# Patient Record
Sex: Female | Born: 1965 | Race: White | Hispanic: No | Marital: Married | Smoking: Never smoker
Health system: Southern US, Community
[De-identification: ages and names within clinical notes are randomized; demographics above are authoritative.]

## PROBLEM LIST (undated history)

## (undated) DIAGNOSIS — R112 Nausea with vomiting, unspecified: Secondary | ICD-10-CM

## (undated) DIAGNOSIS — G4733 Obstructive sleep apnea (adult) (pediatric): Secondary | ICD-10-CM

## (undated) DIAGNOSIS — R61 Generalized hyperhidrosis: Secondary | ICD-10-CM

## (undated) DIAGNOSIS — K219 Gastro-esophageal reflux disease without esophagitis: Secondary | ICD-10-CM

## (undated) DIAGNOSIS — I10 Essential (primary) hypertension: Secondary | ICD-10-CM

## (undated) DIAGNOSIS — E7800 Pure hypercholesterolemia, unspecified: Secondary | ICD-10-CM

## (undated) DIAGNOSIS — Z9889 Other specified postprocedural states: Secondary | ICD-10-CM

## (undated) DIAGNOSIS — M1990 Unspecified osteoarthritis, unspecified site: Secondary | ICD-10-CM

## (undated) DIAGNOSIS — N950 Postmenopausal bleeding: Secondary | ICD-10-CM

## (undated) DIAGNOSIS — F32A Depression, unspecified: Secondary | ICD-10-CM

## (undated) DIAGNOSIS — F419 Anxiety disorder, unspecified: Secondary | ICD-10-CM

## (undated) DIAGNOSIS — R232 Flushing: Secondary | ICD-10-CM

## (undated) DIAGNOSIS — F329 Major depressive disorder, single episode, unspecified: Secondary | ICD-10-CM

## (undated) DIAGNOSIS — Z79890 Hormone replacement therapy (postmenopausal): Secondary | ICD-10-CM

## (undated) DIAGNOSIS — E119 Type 2 diabetes mellitus without complications: Secondary | ICD-10-CM

## (undated) HISTORY — DX: Unspecified osteoarthritis, unspecified site: M19.90

## (undated) HISTORY — DX: Flushing: R23.2

## (undated) HISTORY — DX: Generalized hyperhidrosis: R61

## (undated) HISTORY — DX: Gastro-esophageal reflux disease without esophagitis: K21.9

## (undated) HISTORY — DX: Hormone replacement therapy: Z79.890

## (undated) HISTORY — DX: Postmenopausal bleeding: N95.0

## (undated) HISTORY — DX: Type 2 diabetes mellitus without complications: E11.9

---

## 1994-06-27 HISTORY — PX: LASIK: SHX215

## 1998-06-27 HISTORY — PX: CERVICAL SPINE SURGERY: SHX589

## 1999-02-12 ENCOUNTER — Observation Stay (HOSPITAL_COMMUNITY): Admission: RE | Admit: 1999-02-12 | Discharge: 1999-02-13 | Payer: Self-pay | Admitting: Neurosurgery

## 1999-02-12 ENCOUNTER — Encounter: Payer: Self-pay | Admitting: Neurosurgery

## 1999-06-01 ENCOUNTER — Encounter: Admission: RE | Admit: 1999-06-01 | Discharge: 1999-06-01 | Payer: Self-pay | Admitting: Neurosurgery

## 1999-06-01 ENCOUNTER — Encounter: Payer: Self-pay | Admitting: Neurosurgery

## 2000-03-21 ENCOUNTER — Encounter: Admission: RE | Admit: 2000-03-21 | Discharge: 2000-03-21 | Payer: Self-pay | Admitting: Neurosurgery

## 2000-03-21 ENCOUNTER — Encounter: Payer: Self-pay | Admitting: Neurosurgery

## 2000-06-27 HISTORY — PX: CHOLECYSTECTOMY: SHX55

## 2001-01-08 ENCOUNTER — Encounter: Payer: Self-pay | Admitting: Internal Medicine

## 2001-01-08 ENCOUNTER — Ambulatory Visit (HOSPITAL_COMMUNITY): Admission: RE | Admit: 2001-01-08 | Discharge: 2001-01-08 | Payer: Self-pay | Admitting: Internal Medicine

## 2001-01-15 ENCOUNTER — Ambulatory Visit (HOSPITAL_COMMUNITY): Admission: RE | Admit: 2001-01-15 | Discharge: 2001-01-15 | Payer: Self-pay | Admitting: Internal Medicine

## 2001-01-15 ENCOUNTER — Encounter: Payer: Self-pay | Admitting: Internal Medicine

## 2001-01-26 ENCOUNTER — Ambulatory Visit (HOSPITAL_COMMUNITY): Admission: RE | Admit: 2001-01-26 | Discharge: 2001-01-26 | Payer: Self-pay | Admitting: General Surgery

## 2001-02-09 ENCOUNTER — Other Ambulatory Visit: Admission: RE | Admit: 2001-02-09 | Discharge: 2001-02-09 | Payer: Self-pay | Admitting: Obstetrics and Gynecology

## 2003-07-14 ENCOUNTER — Emergency Department (HOSPITAL_COMMUNITY): Admission: EM | Admit: 2003-07-14 | Discharge: 2003-07-14 | Payer: Self-pay | Admitting: Emergency Medicine

## 2004-12-20 ENCOUNTER — Encounter: Admission: RE | Admit: 2004-12-20 | Discharge: 2004-12-20 | Payer: Self-pay | Admitting: Obstetrics and Gynecology

## 2005-04-18 ENCOUNTER — Other Ambulatory Visit: Admission: RE | Admit: 2005-04-18 | Discharge: 2005-04-18 | Payer: Self-pay | Admitting: Obstetrics and Gynecology

## 2006-05-16 ENCOUNTER — Other Ambulatory Visit: Admission: RE | Admit: 2006-05-16 | Discharge: 2006-05-16 | Payer: Self-pay | Admitting: Obstetrics and Gynecology

## 2006-07-12 ENCOUNTER — Ambulatory Visit: Payer: Self-pay | Admitting: Internal Medicine

## 2006-07-13 ENCOUNTER — Ambulatory Visit (HOSPITAL_COMMUNITY): Admission: RE | Admit: 2006-07-13 | Discharge: 2006-07-13 | Payer: Self-pay | Admitting: Internal Medicine

## 2006-07-17 ENCOUNTER — Ambulatory Visit (HOSPITAL_COMMUNITY): Admission: RE | Admit: 2006-07-17 | Discharge: 2006-07-17 | Payer: Self-pay | Admitting: Internal Medicine

## 2006-07-17 ENCOUNTER — Ambulatory Visit: Payer: Self-pay | Admitting: Internal Medicine

## 2006-10-10 ENCOUNTER — Ambulatory Visit: Payer: Self-pay | Admitting: Internal Medicine

## 2007-03-07 ENCOUNTER — Ambulatory Visit (HOSPITAL_COMMUNITY): Admission: RE | Admit: 2007-03-07 | Discharge: 2007-03-07 | Payer: Self-pay | Admitting: Obstetrics and Gynecology

## 2007-03-22 ENCOUNTER — Ambulatory Visit (HOSPITAL_COMMUNITY): Admission: RE | Admit: 2007-03-22 | Discharge: 2007-03-22 | Payer: Self-pay | Admitting: Family Medicine

## 2007-06-05 ENCOUNTER — Other Ambulatory Visit: Admission: RE | Admit: 2007-06-05 | Discharge: 2007-06-05 | Payer: Self-pay | Admitting: Obstetrics and Gynecology

## 2007-07-26 ENCOUNTER — Ambulatory Visit: Payer: Self-pay | Admitting: Internal Medicine

## 2008-03-06 DIAGNOSIS — K219 Gastro-esophageal reflux disease without esophagitis: Secondary | ICD-10-CM | POA: Insufficient documentation

## 2008-03-06 DIAGNOSIS — L719 Rosacea, unspecified: Secondary | ICD-10-CM

## 2008-03-06 DIAGNOSIS — F429 Obsessive-compulsive disorder, unspecified: Secondary | ICD-10-CM | POA: Insufficient documentation

## 2008-03-06 DIAGNOSIS — K2960 Other gastritis without bleeding: Secondary | ICD-10-CM

## 2008-03-06 DIAGNOSIS — F329 Major depressive disorder, single episode, unspecified: Secondary | ICD-10-CM | POA: Insufficient documentation

## 2008-03-06 DIAGNOSIS — F411 Generalized anxiety disorder: Secondary | ICD-10-CM | POA: Insufficient documentation

## 2008-03-06 DIAGNOSIS — R197 Diarrhea, unspecified: Secondary | ICD-10-CM

## 2008-03-06 DIAGNOSIS — K589 Irritable bowel syndrome without diarrhea: Secondary | ICD-10-CM

## 2008-03-06 DIAGNOSIS — I10 Essential (primary) hypertension: Secondary | ICD-10-CM

## 2008-03-06 DIAGNOSIS — K449 Diaphragmatic hernia without obstruction or gangrene: Secondary | ICD-10-CM | POA: Insufficient documentation

## 2008-03-06 DIAGNOSIS — K210 Gastro-esophageal reflux disease with esophagitis: Secondary | ICD-10-CM

## 2008-03-06 DIAGNOSIS — T7840XA Allergy, unspecified, initial encounter: Secondary | ICD-10-CM | POA: Insufficient documentation

## 2008-03-06 DIAGNOSIS — E7800 Pure hypercholesterolemia, unspecified: Secondary | ICD-10-CM

## 2008-04-01 ENCOUNTER — Ambulatory Visit (HOSPITAL_COMMUNITY): Admission: RE | Admit: 2008-04-01 | Discharge: 2008-04-01 | Payer: Self-pay | Admitting: Obstetrics and Gynecology

## 2008-06-12 ENCOUNTER — Other Ambulatory Visit: Admission: RE | Admit: 2008-06-12 | Discharge: 2008-06-12 | Payer: Self-pay | Admitting: Obstetrics and Gynecology

## 2009-04-08 ENCOUNTER — Ambulatory Visit (HOSPITAL_COMMUNITY): Admission: RE | Admit: 2009-04-08 | Discharge: 2009-04-08 | Payer: Self-pay | Admitting: Obstetrics and Gynecology

## 2009-08-20 ENCOUNTER — Other Ambulatory Visit: Admission: RE | Admit: 2009-08-20 | Discharge: 2009-08-20 | Payer: Self-pay | Admitting: Obstetrics and Gynecology

## 2009-10-21 ENCOUNTER — Ambulatory Visit: Payer: Self-pay | Admitting: Gastroenterology

## 2009-10-21 DIAGNOSIS — R112 Nausea with vomiting, unspecified: Secondary | ICD-10-CM

## 2009-10-21 DIAGNOSIS — R1319 Other dysphagia: Secondary | ICD-10-CM

## 2009-11-09 ENCOUNTER — Encounter (HOSPITAL_COMMUNITY): Admission: RE | Admit: 2009-11-09 | Discharge: 2009-11-09 | Payer: Self-pay | Admitting: Gastroenterology

## 2010-04-12 ENCOUNTER — Ambulatory Visit (HOSPITAL_COMMUNITY): Admission: RE | Admit: 2010-04-12 | Discharge: 2010-04-12 | Payer: Self-pay | Admitting: Obstetrics and Gynecology

## 2010-04-30 ENCOUNTER — Encounter: Payer: Self-pay | Admitting: Gastroenterology

## 2010-04-30 ENCOUNTER — Ambulatory Visit (HOSPITAL_COMMUNITY): Admission: RE | Admit: 2010-04-30 | Discharge: 2010-04-30 | Payer: Self-pay | Admitting: Internal Medicine

## 2010-07-27 NOTE — Assessment & Plan Note (Signed)
Summary: VOMITING, DYSPHAGIA, GERD   Visit Type:  Follow-up Visit Primary Care Provider:  Melony Overly, PA-c  Chief Complaint:  F/U gerd.  History of Present Illness: Seems to have a more sensitive. Gets nausea and sick quite a bit. May thorw up a meal for no reason. Melody Johnson felt sick and had vomiting and not really diarrhea. Since then still has yellow. BMs: 4 and s/t more(soft). Not a lot milk: <2-3x/week. Cheese: 2x/week. Ice Cream: rare. No GB. Trying to lose weight: 6 lbs in past 6 weeks. Heartburn/indigestion: varies w/ control. Sx: gas, burping, lays down spits up acid in throat and mouth. Sx flare 1-2x/week. Vomtiing is random. Doesn't really have problem.  Rare carbonated or caffeinated beverages. No cigs. Mild fat: yesterday-nothing for breakfast, reg coffee: 1/2 cu., weight watcher meal:baked ziti, supper: bologna and egg sandwich. Prilosec two times a day-in AM before and in PM after for 2-3 years. No bld in stool and black tarry stool. Pain not really an issue. Sx since Jan 2011. Getting choked a lot. Feels like food gets hung: 2-3x/week (solids and liquids). Diazepam for dizzy spells, and Xanax for sleep. Pt has not taken fluroxamine for OCD.  Preventive Screening-Counseling & Management  Alcohol-Tobacco     Smoking Status: never      Drug Use:  no.    Current Medications (verified): 1)  Altace 10 Mg Tabs (Ramipril) .... Once Daily 2)  Zyrtec Allergy 10 Mg Tabs (Cetirizine Hcl) .... Once Daily 3)  Valtrex 500 Mg Tabs (Valacyclovir Hcl) .... Once Daily 4)  Aspirin 81 Mg Tabs (Aspirin) .... Once Daily 5)  Multivitamins  Tabs (Multiple Vitamin) .... Once Daily 6)  Citracal Plus  Tabs (Multiple Minerals-Vitamins) .... Four Tablets Daily 7)  Xanax 0.5 Mg Tabs (Alprazolam) .... As Needed 8)  Prilosec 20 Mg Cpdr (Omeprazole) .... Two Times A Day 9)  Fluroxamine 100mg  .... Once Daily 10)  Simvastatin 40 Mg Tabs (Simvastatin) .... Take 1 Tablet By Mouth Once A Day 11)  Diazepam 5  Mg Tabs (Diazepam) .... 1/2 To One Tablet As Needed 12)  Fish Oil 1200 Mg .... Two Daily 13)  Denavir 1 % Crea (Penciclovir) .... As Directed 14)  Fiber Choice .... As Needed 15)  Fiber Advance .... As Needed  Allergies (verified): 1)  ! Codeine 2)  ! Neosporin 3)  ! * Polysporin 4)  ! Penicillin 5)  ! 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  Social History: Occupation: housewife, Marriedx13 years, youngest 6 Patient has never smoked.  Alcohol Use - no Illicit Drug Use - no Smoking Status:  never Drug Use:  no  Review of Systems       Per HPI otherwise all systems negative.  191 lbs in 2009.  Vital Signs:  Patient profile:   45 year old female Height:      62 inches Weight:      194 pounds BMI:     35.61 Temp:     98.6 degrees F oral Pulse rate:   96 / minute BP sitting:   140 / 90  (left arm) Cuff size:   regular  Vitals Entered By: Cloria Spring LPN (October 21, 2009 9:33 AM)  Impression & Recommendations:  Problem # 1:  NAUSEA AND  VOMITING (ICD-787.01) Assessment New  No warning signs. Pt is 3 lbs heavier since 2009. Pt most likely has uncontrolled reflux. Differential diagnosis includes low likelihood of gastroparesis, or esophageal stricture. GES w/i next week. OPV in 3 mos.  Orders: Est. Patient Level V (16109)  Problem # 2:  OTHER DYSPHAGIA (UEA-540.98) Assessment: New  Pt most likely has uncontrolled reflux. Differential diagnosis includes primary esophageal motility disorder and less likely a esophageal stricture.  BPE w/i next week. If stricture, or EMD seen then will need an EGD.   Orders: Est. Patient Level V (11914)  Problem # 3:  GERD (ICD-530.81) Assessment:  Unchanged  Pt's Sx not ideally controlled. Pt has a diet that at times is not low in fat. Continue Prilosec two times a day. May clinic HO given. Pt also having 4 soft stool daily and consumes fat/lactose. No diarrhea. Add DA-LI daily. Low fat/high fiber diet. HO given.  CC: PCP  Orders: Est. Patient Level V (78295)  Patient Instructions: 1)  Follow a low fat/high fiber diet. SEE HANDOUT. 2)  Continue Prilosec two times a day. Take 30 minutes before meals. 3)  Get Barium Swallow and gastric emptying study. 4)  TAKE PROBIOTIC DAILY. 5)  Return visit in 3 mos. 6)  The medication list was reviewed and reconciled.  All changed / newly prescribed medications were explained.  A complete medication list was provided to the patient / caregiver.  Appended Document: VOMITING, DYSPHAGIA, GERD reminder in computer

## 2010-07-27 NOTE — Letter (Signed)
Summary: GES/BPE ORDER  GES/BPE ORDER   Imported By: Ave Filter 10/21/2009 10:35:23  _____________________________________________________________________  External Attachment:    Type:   Image     Comment:   External Document  Appended Document: GES/BPE ORDER Pt rescheduled to 11/09/09@8 :00a.m.

## 2010-07-27 NOTE — Letter (Signed)
Summary: RELEASE OF RECORDS TO [facility]  RELEASE OF RECORDS TO [facility]   Imported By: Diana Eves 04/30/2010 13:13:38  _____________________________________________________________________  External Attachment:    Type:   Image     Comment:   External Document

## 2010-07-29 ENCOUNTER — Other Ambulatory Visit (HOSPITAL_COMMUNITY): Payer: Self-pay | Admitting: Orthopaedic Surgery

## 2010-07-29 DIAGNOSIS — M25569 Pain in unspecified knee: Secondary | ICD-10-CM

## 2010-08-03 ENCOUNTER — Other Ambulatory Visit (HOSPITAL_COMMUNITY): Payer: Self-pay

## 2010-08-04 ENCOUNTER — Ambulatory Visit (HOSPITAL_COMMUNITY)
Admission: RE | Admit: 2010-08-04 | Discharge: 2010-08-04 | Disposition: A | Discharge status: Home / Self Care | Payer: 59 | Source: Ambulatory Visit | Attending: Orthopaedic Surgery | Admitting: Orthopaedic Surgery

## 2010-08-04 DIAGNOSIS — M25569 Pain in unspecified knee: Secondary | ICD-10-CM | POA: Insufficient documentation

## 2010-08-19 ENCOUNTER — Other Ambulatory Visit (HOSPITAL_COMMUNITY)
Admission: RE | Admit: 2010-08-19 | Discharge: 2010-08-19 | Disposition: A | Discharge status: Home / Self Care | Payer: 59 | Source: Ambulatory Visit | Attending: Obstetrics and Gynecology | Admitting: Obstetrics and Gynecology

## 2010-08-19 DIAGNOSIS — Z01419 Encounter for gynecological examination (general) (routine) without abnormal findings: Secondary | ICD-10-CM | POA: Insufficient documentation

## 2010-11-09 NOTE — Assessment & Plan Note (Signed)
Melody Johnson, Melody Johnson              CHART#:  [id number]   DATE:  07/26/2007                       DOB:  November 29, 1965   PRIMARY CARE PHYSICIAN:  Melony Overly, PA at [facility].   CHIEF COMPLAINT:  Follow up chronic gastroesophageal reflux disease.   SUBJECTIVE:  Melody Johnson is a 45 year old female who has history of  chronic GERD and erosive reflux esophagitis and gastritis on last EGD,  which was July 17, 2006. She was not poorly controlled on daily PPI.  She has since been on b.i.d. therapy. She is currently on Omeprazole 20  mg b.i.d. She is doing well. She has tried to decrease her dosage to  once daily, however, is with having refractory symptoms, her weight is  down 7 pounds in the last year. She does not have anorexia or abdominal  pain. Denies any fatigue. Denies rectal bleeding or melena. She has had  a physical examination including pap smear. She is also being treated  for anxiety and OCD. She denies any family history of colorectal  carcinoma.   CURRENT MEDICATIONS:  See records from July 26, 2007.   ALLERGIES:  CODEINE, NEOSPORIN, POLYSPORIN, PENICILLIN, , SULFA.   PHYSICAL EXAMINATION:  VITAL SIGNS:  Weight 191 pounds. Height 62  inches. Temperature 98.7, blood pressure 120/90, pulse 80.  GENERAL:  She is an obese, Caucasian female. Alert and oriented. No  acute distress.  HEENT:  Sclera clear.  Oropharynx pink and moist without any lesions.  HEART:  Regular rate and rhythm. Normal S1 and S2.  ABDOMEN:  Positive bowel sounds x4. No bruits auscultated. Soft,  nontender, and nondistended. Without palpable mass or  hepatosplenomegaly. No rebound tenderness or guarding, exam limited  secondary to patient's body habitus.  EXTREMITIES:  Without clubbing or edema.   ASSESSMENT:  History of complicated gastroesophageal reflux disease with  erosive reflux esophagitis and gastritis, well controlled on b.i.d. PPI.   PLAN:  1. Commended her on weight loss  and she should have continued gradual      weight loss.  2. Screening colonoscopy at age 8.  3. Omeprazole 20 mg b.i.d.       Lorenza Burton, N.P.  Electronically Signed     Kassie Mends, M.D.  Electronically Signed    KJ/MEDQ  D:  07/26/2007  T:  07/26/2007  Job:  [id number]   cc:   Melony Overly, PA
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
## 2010-11-12 NOTE — Op Note (Signed)
NAME:  Melody Johnson, Melody Johnson             ACCOUNT NO.:  [id number]   MEDICAL RECORD NO.:  [id number]          PATIENT TYPE:  AMB   LOCATION:  DAY                           FACILITY:  [facility]   PHYSICIAN:  Lionel December, M.D.    DATE OF BIRTH:  10/29/1965   DATE OF PROCEDURE:  07/17/2006  DATE OF DISCHARGE:                               OPERATIVE REPORT   PROCEDURE:  Esophagogastroduodenoscopy.   INDICATIONS:  Ms. Coonrod is a 45 year old Caucasian female who has had  symptoms of GERD for 3 years.  She was initially on Nexium and was doing  well, but had to be switched to another PPI because of her plan.  Now  she is having frequent nocturnal symptoms with regurgitation and  intractable heartburn.  She is undergoing diagnostic EGD.  Procedure  risks were reviewed with the patient and  informed consent was obtained.   MEDICATIONS FOR CONSCIOUS SEDATION:  1. Benzocaine spray for pharyngeal topical anesthesia.  2. Demerol 50 mg IV.  3. Versed 12 mg IV.   FINDINGS:  Procedure performed in endoscopy suite.  The patient's vital  signs and O2 saturations were monitored during the procedure, and  remained stable.   DESCRIPTION OF PROCEDURE:  The patient was placed in the left lateral  recumbent position and the Pentax videoscope was passed via oropharynx  without any difficulty into the esophagus.   ESOPHAGUS:  Mucosa of the esophagus was normal.  Single erosion was  noted at GE junction, which was at 34 cm from the incisors and hiatus  was at 37 cm.  She had 3 cm size sliding hiatal hernia, which became  more prominent -- she heaved a couple of times during the procedure.   STOMACH.  Empty and distended very well with insufflation.  Folds of the  proximal stomach were normal.  Examination of the mucosa revealed 2  prepyloric erosions but no ulcer crater was noted.  Pyloric channel was  patent.  Angularis, fundus and cardia were examined by retroflexing the  scope and were normal.   DUODENUM:   Bulbar mucosa was normal.  Scope was passed into second part  of the duodenum, where mucosa and folds were normal.   The endoscope was withdrawn.  The patient tolerated the procedure well.   FINAL DIAGNOSES:  1. Erosive reflux esophagitis with small sliding hiatal hernia.  2. Erosive antral gastritis.  3. The patient's GERD is not well-controlled with single dose PPI.   RECOMMENDATIONS:  1. Antireflux measures reinforced.  2. Switch her to Omeprazole 20 mg p.o. b.i.d.; prescription given for      60 with 3 refills.  3. H. pylori serology to be checked today.      Lionel December, M.D.  Electronically Signed     NR/MEDQ  D:  07/17/2006  T:  07/17/2006  Job:  [id number]   cc:   Melony Overly, PA

## 2011-03-10 ENCOUNTER — Other Ambulatory Visit: Payer: Self-pay | Admitting: Obstetrics and Gynecology

## 2011-03-10 DIAGNOSIS — Z139 Encounter for screening, unspecified: Secondary | ICD-10-CM

## 2011-04-14 ENCOUNTER — Ambulatory Visit (HOSPITAL_COMMUNITY)
Admission: RE | Admit: 2011-04-14 | Discharge: 2011-04-14 | Disposition: A | Discharge status: Home / Self Care | Payer: 59 | Source: Ambulatory Visit | Attending: Obstetrics and Gynecology | Admitting: Obstetrics and Gynecology

## 2011-04-14 DIAGNOSIS — Z1231 Encounter for screening mammogram for malignant neoplasm of breast: Secondary | ICD-10-CM | POA: Insufficient documentation

## 2011-04-14 DIAGNOSIS — Z139 Encounter for screening, unspecified: Secondary | ICD-10-CM

## 2011-04-25 ENCOUNTER — Emergency Department (HOSPITAL_COMMUNITY)
Admission: EM | Admit: 2011-04-25 | Discharge: 2011-04-25 | Disposition: A | Discharge status: Home / Self Care | Payer: 59 | Attending: Emergency Medicine | Admitting: Emergency Medicine

## 2011-04-25 ENCOUNTER — Emergency Department (HOSPITAL_COMMUNITY): Payer: 59

## 2011-04-25 ENCOUNTER — Encounter: Payer: Self-pay | Admitting: *Deleted

## 2011-04-25 DIAGNOSIS — I10 Essential (primary) hypertension: Secondary | ICD-10-CM | POA: Insufficient documentation

## 2011-04-25 DIAGNOSIS — E7800 Pure hypercholesterolemia, unspecified: Secondary | ICD-10-CM | POA: Insufficient documentation

## 2011-04-25 DIAGNOSIS — G4733 Obstructive sleep apnea (adult) (pediatric): Secondary | ICD-10-CM | POA: Insufficient documentation

## 2011-04-25 DIAGNOSIS — N200 Calculus of kidney: Secondary | ICD-10-CM | POA: Insufficient documentation

## 2011-04-25 HISTORY — DX: Pure hypercholesterolemia, unspecified: E78.00

## 2011-04-25 HISTORY — DX: Obstructive sleep apnea (adult) (pediatric): G47.33

## 2011-04-25 HISTORY — DX: Essential (primary) hypertension: I10

## 2011-04-25 LAB — CBC
HCT: 38.7 % (ref 36.0–46.0)
Hemoglobin: 13.1 g/dL (ref 12.0–15.0)
MCHC: 33.9 g/dL (ref 30.0–36.0)
RBC: 4.11 MIL/uL (ref 3.87–5.11)
WBC: 10.1 10*3/uL (ref 4.0–10.5)

## 2011-04-25 LAB — URINALYSIS, ROUTINE W REFLEX MICROSCOPIC
Glucose, UA: NEGATIVE mg/dL
Leukocytes, UA: NEGATIVE
Nitrite: NEGATIVE
Specific Gravity, Urine: 1.015 (ref 1.005–1.030)
pH: 5.5 (ref 5.0–8.0)

## 2011-04-25 LAB — BASIC METABOLIC PANEL
BUN: 11 mg/dL (ref 6–23)
CO2: 25 mEq/L (ref 19–32)
Chloride: 103 mEq/L (ref 96–112)
GFR calc Af Amer: >90 mL/min (ref >90)
Potassium: 3.7 mEq/L (ref 3.5–5.1)

## 2011-04-25 LAB — DIFFERENTIAL
Basophils Relative: 0 % (ref 0–1)
Lymphocytes Relative: 22 % (ref 12–46)
Monocytes Absolute: 0.8 10*3/uL (ref 0.1–1.0)
Monocytes Relative: 8 % (ref 3–12)
Neutro Abs: 6.9 10*3/uL (ref 1.7–7.7)
Neutrophils Relative %: 68 % (ref 43–77)

## 2011-04-25 LAB — URINE MICROSCOPIC-ADD ON

## 2011-04-25 MED ORDER — OXYCODONE-ACETAMINOPHEN 5-325 MG PO TABS: 1.0000 | ORAL_TABLET | ORAL | Status: AC | PRN

## 2011-04-25 MED ORDER — SODIUM CHLORIDE 0.9 % IV BOLUS (SEPSIS)
1000.0000 mL | Freq: Once | INTRAVENOUS | Status: AC
  Administered 2011-04-25: 1000 mL via INTRAVENOUS

## 2011-04-25 MED ORDER — ONDANSETRON HCL 4 MG PO TABS: 4.0000 mg | ORAL_TABLET | Freq: Four times a day (QID) | ORAL | Status: AC

## 2011-04-25 MED ORDER — MORPHINE SULFATE 4 MG/ML IJ SOLN
4.0000 mg | Freq: Once | INTRAMUSCULAR | Status: AC
  Administered 2011-04-25: 4 mg via INTRAVENOUS
  Filled 2011-04-25: qty 1

## 2011-04-25 MED ORDER — ONDANSETRON HCL 4 MG/2ML IJ SOLN
4.0000 mg | Freq: Once | INTRAMUSCULAR | Status: AC
  Administered 2011-04-25: 4 mg via INTRAVENOUS
  Filled 2011-04-25: qty 2

## 2011-04-25 NOTE — ED Notes (Signed)
Pt c/o left flank pain x 10 days off and on. Pt also c/o painful urination, urgency, and ? Blood in urine. Pt states that she has had vomiting this morning.

## 2011-04-25 NOTE — ED Provider Notes (Signed)
History     CSN: [id number] Arrival date & time: 04/25/2011  8:58 AM   First MD Initiated Contact with Patient 04/25/11 [phone]      Chief Complaint  Patient presents with  . Flank Pain    (Consider location/radiation/quality/duration/timing/severity/associated sxs/prior treatment) HPI Comments: paitent c/o left flank pain, dark colored urine and nausea and vomiting that occurred 10 days ago then resolved.  States she woke up this morning at 6:00 am with similar symptoms again.  States the pain is sharp feeling in her bladder and "dull ache" to her left flank at present.  She denies hx of previous kidney stones.  Also denies fever, chills, chest pain or shortness of breath.  Patient is a 45 y.o. female presenting with flank pain.  Flank Pain Associated symptoms include abdominal pain, nausea and vomiting. Pertinent negatives include no arthralgias, chest pain, chills, coughing, fatigue, fever, myalgias, neck pain, numbness, rash, sore throat or weakness. Exacerbated by: urination. She has tried nothing for the symptoms. The treatment provided no relief.    Past Medical History  Diagnosis Date  . Hypertension   . High cholesterol   . Obstructive sleep apnea     Past Surgical History  Procedure Date  . Cholecystectomy   . Cervical spine surgery     History reviewed. No pertinent family history.  History  Substance Use Topics  . Smoking status: Never Smoker   . Smokeless tobacco: Not on file  . Alcohol Use: No    OB History    Grav Para Term Preterm Abortions TAB SAB Ect Mult Living                  Review of Systems  Constitutional: Negative for fever, chills and fatigue.  HENT: Negative for sore throat, trouble swallowing, neck pain and neck stiffness.   Respiratory: Negative for cough, shortness of breath and wheezing.   Cardiovascular: Negative for chest pain and palpitations.  Gastrointestinal: Positive for nausea, vomiting and abdominal pain.  Genitourinary:  Positive for dysuria, urgency, flank pain and difficulty urinating. Negative for hematuria, vaginal bleeding and vaginal discharge.  Musculoskeletal: Negative for myalgias, back pain and arthralgias.  Skin: Negative for rash.  Neurological: Negative for dizziness, weakness and numbness.  Hematological: Does not bruise/bleed easily.  All other systems reviewed and are negative.    Allergies  Bacitracin-polymyxin b; Codeine; Penicillins; Sulfonamide derivatives; and Triple antibiotic  Home Medications   Current Outpatient Rx  Name Route Sig Dispense Refill  . ACYCLOVIR 400 MG PO TABS Oral Take 400 mg by mouth daily.      . ASPIRIN 81 MG PO CHEW Oral Chew 81 mg by mouth daily.      Marland Kitchen CITRACAL CALCIUM GUMMIES PO Oral Take 4 capsules by mouth daily.      Marland Kitchen CETIRIZINE HCL 10 MG PO TABS Oral Take 10 mg by mouth daily.      Marland Kitchen ESTRADIOL-NORETHINDRONE ACET 0.05-0.25 MG/DAY TD PTTW Transdermal Place 1 patch onto the skin 2 (two) times a week.      . OMEGA-3 FATTY ACIDS 1000 MG PO CAPS Oral Take 1 g by mouth 2 (two) times daily.      Marland Kitchen FLUVOXAMINE MALEATE 100 MG PO TABS Oral Take 100 mg by mouth daily.      Marland Kitchen MIRTAZAPINE 15 MG PO TABS Oral Take 15 mg by mouth at bedtime.      Carma Leaven M PLUS PO TABS Oral Take 1 tablet by mouth daily.      Marland Kitchen  OMEPRAZOLE 20 MG PO CPDR Oral Take 20 mg by mouth daily.      Marland Kitchen PENCICLOVIR 1 % EX CREA Topical Apply 1 application topically every 2 (two) hours. Herpes outbreak     . SIMVASTATIN 40 MG PO TABS Oral Take 40 mg by mouth at bedtime.        BP 154/96  Pulse 75  Temp(Src) 98.5 F (36.9 C) (Oral)  Resp 16  Ht 5\' 3"  (1.6 m)  Wt 185 lb (83.915 kg)  BMI 32.77 kg/m2  SpO2 100%  Physical Exam  Nursing note and vitals reviewed. Constitutional: She is oriented to person, place, and time. She appears well-developed and well-nourished. No distress.  HENT:  Head: Normocephalic and atraumatic.  Mouth/Throat: Oropharynx is clear and moist.  Neck: Normal range of  motion. Neck supple.  Cardiovascular: Normal rate, regular rhythm and normal heart sounds.   Pulmonary/Chest: Effort normal and breath sounds normal. No respiratory distress. She exhibits no tenderness.  Abdominal: Soft. Normal appearance. She exhibits no distension and no mass. There is no hepatosplenomegaly. There is tenderness in the suprapubic area. There is no rigidity, no rebound, no guarding, no CVA tenderness and no tenderness at McBurney's point.  Musculoskeletal: Normal range of motion. She exhibits no tenderness.  Lymphadenopathy:    She has no cervical adenopathy.  Neurological: She is alert and oriented to person, place, and time. No cranial nerve deficit. She exhibits normal muscle tone. Coordination normal.  Skin: Skin is warm and dry.    ED Course  Procedures (including critical care time)   Results for orders placed during the hospital encounter of 04/25/11  URINALYSIS, ROUTINE W REFLEX MICROSCOPIC      Component Value Range   Color, Urine YELLOW  YELLOW    Appearance HAZY (*) CLEAR    Specific Gravity, Urine 1.015  1.005 - 1.030    pH 5.5  5.0 - 8.0    Glucose, UA NEGATIVE  NEGATIVE (mg/dL)   Hgb urine dipstick LARGE (*) NEGATIVE    Bilirubin Urine NEGATIVE  NEGATIVE    Ketones, ur NEGATIVE  NEGATIVE (mg/dL)   Protein, ur 161 (*) NEGATIVE (mg/dL)   Urobilinogen, UA 0.2  0.0 - 1.0 (mg/dL)   Nitrite NEGATIVE  NEGATIVE    Leukocytes, UA NEGATIVE  NEGATIVE   CBC      Component Value Range   WBC 10.1  4.0 - 10.5 (K/uL)   RBC 4.11  3.87 - 5.11 (MIL/uL)   Hemoglobin 13.1  12.0 - 15.0 (g/dL)   HCT 09.6  04.5 - 40.9 (%)   MCV 94.2  78.0 - 100.0 (fL)   MCH 31.9  26.0 - 34.0 (pg)   MCHC 33.9  30.0 - 36.0 (g/dL)   RDW 81.1  91.4 - 78.2 (%)   Platelets 417 (*) 150 - 400 (K/uL)  DIFFERENTIAL      Component Value Range   Neutrophils Relative 68  43 - 77 (%)   Neutro Abs 6.9  1.7 - 7.7 (K/uL)   Lymphocytes Relative 22  12 - 46 (%)   Lymphs Abs 2.2  0.7 - 4.0 (K/uL)    Monocytes Relative 8  3 - 12 (%)   Monocytes Absolute 0.8  0.1 - 1.0 (K/uL)   Eosinophils Relative 2  0 - 5 (%)   Eosinophils Absolute 0.2  0.0 - 0.7 (K/uL)   Basophils Relative 0  0 - 1 (%)   Basophils Absolute 0.0  0.0 - 0.1 (K/uL)  BASIC METABOLIC  PANEL      Component Value Range   Sodium 138  135 - 145 (mEq/L)   Potassium 3.7  3.5 - 5.1 (mEq/L)   Chloride 103  96 - 112 (mEq/L)   CO2 25  19 - 32 (mEq/L)   Glucose, Bld 163 (*) 70 - 99 (mg/dL)   BUN 11  6 - 23 (mg/dL)   Creatinine, Ser 4.09  0.50 - 1.10 (mg/dL)   Calcium 9.7  8.4 - 81.1 (mg/dL)   GFR calc non Af Amer >90  >90 (mL/min)   GFR calc Af Amer >90  >90 (mL/min)  URINE MICROSCOPIC-ADD ON      Component Value Range   Squamous Epithelial / LPF RARE  RARE    WBC, UA 0-2  <3 (WBC/hpf)   RBC / HPF TOO NUMEROUS TO COUNT  <3 (RBC/hpf)   Bacteria, UA RARE  RARE      Ct Abdomen Pelvis Wo Contrast  04/25/2011  *RADIOLOGY REPORT*  Clinical Data: Left flank pain.  Pain with urination.  CT ABDOMEN AND PELVIS WITHOUT CONTRAST  Technique:  Multidetector CT imaging of the abdomen and pelvis was performed following the standard protocol without intravenous contrast.  Comparison: Abdominal ultrasound 04/30/2010.  Findings: The lung bases are clear.  No pleural or pericardial effusion.  The patient has some stranding about the left kidney and ureter with mild left hydronephrosis due to a 0.5 cm distal left ureteral stone.  No other urinary tract stones are seen on the right or left.  The kidneys are otherwise unremarkable in appearance.  The patient is status post cholecystectomy.  The liver is diffusely low attenuating consistent with fatty change.  The spleen, adrenal glands and pancreas appear normal.  The stomach, small and large bowel and appendix appear normal. Uterus and adnexa are normal in appearance.  No lymphadenopathy or fluid is identified.  There is no focal bony abnormality.  IMPRESSION:  1.  Mild left hydronephrosis due to a 0.5 cm  stone in the distal left ureter. 2.  Fatty infiltration of the liver. 3.  Status post cholecystectomy.  Original Report Authenticated By: Bernadene Bell. D'ALESSIO, M.D.     MDM    9:37 AM patient is resting, pain has improved somewhat since ED arrival.  Sx's are c/w kidney stone.  Will address her pain, order labs and CT scan.     11:48 AM patient resting,  Pain has resolved.  Ambulates well.  I have informed the pt of her diagnosis and will dispense urine strainer and have her f/u with urology this week.   Pt feels improved after observation and/or treatment in ED.   Patient / Family / Caregiver understand and agree with initial ED impression and plan with expectations set for ED visit.     OUTPATIENT MEDICATIONS PRESCRIBED FROM THE ED:  New Prescriptions   ONDANSETRON (ZOFRAN) 4 MG TABLET    Take 1 tablet (4 mg total) by mouth every 6 (six) hours. Prn nausea/vomiting   OXYCODONE-ACETAMINOPHEN (PERCOCET) 5-325 MG PER TABLET    Take 1 tablet by mouth every 4 (four) hours as needed for pain.     Biran Mayberry L. Coolidge, [place] 04/25/11 2101

## 2011-04-26 NOTE — ED Provider Notes (Signed)
Medical screening examination/treatment/procedure(s) were performed by non-physician practitioner and as supervising physician I was immediately available for consultation/collaboration.  Ethelda Chick, MD 04/26/11 [phone]

## 2011-05-03 ENCOUNTER — Other Ambulatory Visit (HOSPITAL_COMMUNITY): Payer: Self-pay | Admitting: Urology

## 2011-05-03 ENCOUNTER — Ambulatory Visit (HOSPITAL_COMMUNITY)
Admission: RE | Admit: 2011-05-03 | Discharge: 2011-05-03 | Disposition: A | Discharge status: Home / Self Care | Payer: 59 | Source: Ambulatory Visit | Attending: Urology | Admitting: Urology

## 2011-05-03 DIAGNOSIS — N201 Calculus of ureter: Secondary | ICD-10-CM

## 2011-05-03 DIAGNOSIS — R1032 Left lower quadrant pain: Secondary | ICD-10-CM | POA: Insufficient documentation

## 2011-05-10 ENCOUNTER — Other Ambulatory Visit (HOSPITAL_COMMUNITY): Payer: Self-pay | Admitting: Urology

## 2011-05-10 ENCOUNTER — Ambulatory Visit (HOSPITAL_COMMUNITY)
Admission: RE | Admit: 2011-05-10 | Discharge: 2011-05-10 | Disposition: A | Discharge status: Home / Self Care | Payer: 59 | Source: Ambulatory Visit | Attending: Urology | Admitting: Urology

## 2011-05-10 DIAGNOSIS — N201 Calculus of ureter: Secondary | ICD-10-CM

## 2011-05-10 DIAGNOSIS — R109 Unspecified abdominal pain: Secondary | ICD-10-CM | POA: Insufficient documentation

## 2011-05-24 ENCOUNTER — Ambulatory Visit (HOSPITAL_COMMUNITY)
Admission: RE | Admit: 2011-05-24 | Discharge: 2011-05-24 | Disposition: A | Discharge status: Home / Self Care | Payer: 59 | Source: Ambulatory Visit | Attending: Urology | Admitting: Urology

## 2011-05-24 ENCOUNTER — Other Ambulatory Visit (HOSPITAL_COMMUNITY): Payer: Self-pay | Admitting: Urology

## 2011-05-24 DIAGNOSIS — R1032 Left lower quadrant pain: Secondary | ICD-10-CM | POA: Insufficient documentation

## 2011-05-24 DIAGNOSIS — Z09 Encounter for follow-up examination after completed treatment for conditions other than malignant neoplasm: Secondary | ICD-10-CM | POA: Insufficient documentation

## 2011-05-24 DIAGNOSIS — N201 Calculus of ureter: Secondary | ICD-10-CM

## 2011-05-28 HISTORY — PX: LITHOTRIPSY: SUR834

## 2011-06-13 ENCOUNTER — Encounter (HOSPITAL_COMMUNITY): Payer: Self-pay | Admitting: Dietician

## 2011-06-14 NOTE — Progress Notes (Signed)
[facility] Diabetes Class Completion  Date:June 14, 2011  Time: 10:00 AM  Pt attended [facility]'s Diabetes Class on June 14, 2011.   Patient was educated on the following topics: carbohydrate metabolism in relation to diabetes, sources of carbohydrate, carbohydrate counting, meal planning strategies, food label reading, and portion control.   Melody Haver, RD, LDN Date:June 14, 2011 Time: 10:00 AM

## 2011-07-12 ENCOUNTER — Ambulatory Visit (HOSPITAL_COMMUNITY)
Admission: RE | Admit: 2011-07-12 | Discharge: 2011-07-12 | Disposition: A | Discharge status: Home / Self Care | Payer: 59 | Source: Ambulatory Visit | Attending: Urology | Admitting: Urology

## 2011-07-12 ENCOUNTER — Other Ambulatory Visit (HOSPITAL_COMMUNITY): Payer: Self-pay | Admitting: Urology

## 2011-07-12 DIAGNOSIS — R109 Unspecified abdominal pain: Secondary | ICD-10-CM | POA: Insufficient documentation

## 2011-07-12 DIAGNOSIS — N201 Calculus of ureter: Secondary | ICD-10-CM | POA: Insufficient documentation

## 2011-08-04 ENCOUNTER — Ambulatory Visit (INDEPENDENT_AMBULATORY_CARE_PROVIDER_SITE_OTHER): Payer: 59 | Admitting: General Surgery

## 2011-08-04 ENCOUNTER — Encounter (INDEPENDENT_AMBULATORY_CARE_PROVIDER_SITE_OTHER): Payer: Self-pay | Admitting: General Surgery

## 2011-08-04 LAB — COMPREHENSIVE METABOLIC PANEL
ALT: 74 U/L — ABNORMAL HIGH (ref 0–35)
AST: 37 U/L (ref 0–37)
Chloride: 103 mEq/L (ref 96–112)
Creat: 0.79 mg/dL (ref 0.50–1.10)
Total Bilirubin: 0.4 mg/dL (ref 0.3–1.2)

## 2011-08-04 LAB — CBC WITH DIFFERENTIAL/PLATELET
Basophils Absolute: 0 10*3/uL (ref 0.0–0.1)
Eosinophils Absolute: 0.5 10*3/uL (ref 0.0–0.7)
Eosinophils Relative: 5 % (ref 0–5)
Lymphocytes Relative: 36 % (ref 12–46)
MCV: 92.5 fL (ref 78.0–100.0)
Neutrophils Relative %: 51 % (ref 43–77)
Platelets: 434 10*3/uL — ABNORMAL HIGH (ref 150–400)
RDW: 13.2 % (ref 11.5–15.5)
WBC: 10.2 10*3/uL (ref 4.0–10.5)

## 2011-08-04 LAB — IRON AND TIBC
%SAT: 19 % — ABNORMAL LOW (ref 20–55)
Iron: 86 ug/dL (ref 42–145)
TIBC: 462 ug/dL (ref 250–470)

## 2011-08-04 LAB — HEMOGLOBIN A1C: Mean Plasma Glucose: 189 mg/dL — ABNORMAL HIGH (ref <117)

## 2011-08-04 NOTE — Progress Notes (Signed)
Patient ID: [facility], female   DOB: [birth year], 46 y.o.   MRN: [id number]  Chief Complaint  Patient presents with  . Bariatric Pre-op    HPI Mel[facility] is a 46 y.o. female.  This patient presents for evaluation for possible weight loss surgery. She has attended our information session and is interested in the left. She was originally interested in the laparoscopic Roux-en-Y gastric bypass however, her doctors suggested a LAP-BAND for her. She states that she is struggling for the last 10 years and has tried multiple diets including Weight Watchers several times physicians diet and phentermine and has had the most success with the phentermine but she regained her weight after coming off medication. She states that she walks daily. She was recently diagnosed with type 2 diabetes which encouraged her to pursue weight loss surgery. HPI  Past Medical History  Diagnosis Date  . Hypertension   . High cholesterol   . Obstructive sleep apnea   . Arthritis   . Diabetes mellitus   . GERD (gastroesophageal reflux disease)     Past Surgical History  Procedure Date  . Cervical spine surgery 2000  . Lasik 1996  . Cholecystectomy 2002  . Lithotripsy 05/2011    Family History  Problem Relation Age of Onset  . Cancer Sister     cervical  . Cancer Maternal Grandmother     colon    Social History History  Substance Use Topics  . Smoking status: Never Smoker   . Smokeless tobacco: Never Used  . Alcohol Use: Yes     very rarely    Allergies  Allergen Reactions  . Bacitracin-Polymyxin B Other (See Comments)    Will actually cause infection instead of healing the area it has been applied to.  . Codeine Nausea Only  . Penicillins Rash    Starts at feet and works its way up the body.   . Triple Antibiotic Other (See Comments)    Will actually cause infection instead of healing the area it has been applied to.  . Sulfonamide Derivatives     REACTION: UNKNOWN REACTION     Current Outpatient Prescriptions  Medication Sig Dispense Refill  . Calcium Citrate-Vitamin D (CITRACAL + D PO) Take by mouth 4 (four) times daily.      . metFORMIN (GLUCOPHAGE) 500 MG tablet Daily.      . ramipril (ALTACE) 10 MG capsule Daily.      Marland Kitchen acyclovir (ZOVIRAX) 400 MG tablet Take 400 mg by mouth daily.        Marland Kitchen aspirin 81 MG chewable tablet Chew 81 mg by mouth daily.        . cetirizine (ZYRTEC) 10 MG tablet Take 10 mg by mouth daily.        . fish oil-omega-3 fatty acids 1000 MG capsule Take 1 g by mouth 2 (two) times daily.        . fluvoxaMINE (LUVOX) 100 MG tablet Take 100 mg by mouth daily.        . mirtazapine (REMERON) 15 MG tablet Take 15 mg by mouth at bedtime.        . Multiple Vitamins-Minerals (MULTIVITAMINS THER. W/MINERALS) TABS Take 1 tablet by mouth daily.        Marland Kitchen omeprazole (PRILOSEC) 20 MG capsule Take 20 mg by mouth daily.        Marland Kitchen penciclovir (DENAVIR) 1 % cream Apply 1 application topically every 2 (two) hours. Herpes outbreak       .  simvastatin (ZOCOR) 40 MG tablet Take 40 mg by mouth at bedtime.          Review of Systems Review of Systems All other review of systems negative or noncontributory except as stated in the HPI  Blood pressure 142/96, pulse 72, temperature 97.9 F (36.6 C), temperature source Temporal, resp. rate 18, height 5' 3.5" (1.613 m), weight 202 lb 3.2 oz (91.717 kg).  Physical Exam Physical Exam Physical Exam  Nursing note and vitals reviewed. Constitutional: She is oriented to person, place, and time. She appears well-developed and well-nourished. No distress.  HENT:  Head: Normocephalic and atraumatic.  Mouth/Throat: No oropharyngeal exudate.  Eyes: Conjunctivae and EOM are normal. Pupils are equal, round, and reactive to light. Right eye exhibits no discharge. Left eye exhibits no discharge. No scleral icterus.  Neck: Normal range of motion. Neck supple. No tracheal deviation present.  Cardiovascular: Normal rate,  regular rhythm, normal heart sounds and intact distal pulses.   Pulmonary/Chest: Effort normal and breath sounds normal. No stridor. No respiratory distress. She has no wheezes.  Abdominal: Soft. Bowel sounds are normal. She exhibits no distension and no mass. There is no tenderness. There is no rebound and no guarding.  Musculoskeletal: Normal range of motion. She exhibits no edema and no tenderness.  Neurological: She is alert and oriented to person, place, and time.  Skin: Skin is warm and dry. No rash noted. She is not diaphoretic. No erythema. No pallor.  Psychiatric: She has a normal mood and affect. Her behavior is normal. Judgment and thought content normal.    Data Reviewed   Assessment    Morbid obesity with a BMI of 36 and hypertension, hypercholesterolemia, depression, reflux, objective sleep apnea, and diabetes mellitus. I think that she be a great candidate for weight loss surgery. She has multiple comorbidities which could certainly be improved with the weight loss. I discussed with her all of the 3 weight loss procedures that we offered including the lap band, sleeve gastrectomy, and Roux-en-Y gastric bypass. We discussed the pros and cons of each procedure and she is trying to decide whether she is interested in the Roux-en-Y gastric bypass with a LAP-BAND. I do not think that she would be a great candidate for the sleeve gastrectomy given her reflux. We discussed the risks of each procedure and she should be a candidate for gastric bypass or lap band.    Plan    She  is currently undecided between the gastric bypass and a lap band. She is going to do some additional research thin, but the decision. In the meantime, we will start her on a workup for gastric bypass and he can have her see her in the nutritionist and psychologist for evaluation. She says that she has a history of a hiatal hernia and we will check an upper GI to evaluate this. We'll also check some nutrition labs. We  will see her back after her preoperative evaluation proceeds.       Lodema Pilot DAVID 08/04/2011, 12:31 PM

## 2011-08-11 ENCOUNTER — Encounter: Payer: Self-pay | Admitting: *Deleted

## 2011-08-11 ENCOUNTER — Encounter: Payer: 59 | Attending: General Surgery | Admitting: *Deleted

## 2011-08-11 DIAGNOSIS — Z713 Dietary counseling and surveillance: Secondary | ICD-10-CM | POA: Insufficient documentation

## 2011-08-11 DIAGNOSIS — Z01818 Encounter for other preprocedural examination: Secondary | ICD-10-CM | POA: Insufficient documentation

## 2011-08-11 NOTE — Patient Instructions (Addendum)
   Follow Pre-Op Nutrition Goals to prepare for surgery.   Call the [facility] at [phone] to enroll in the Pre-Op Nutrition Class once you have been given your surgery date.  You will need to attend this nutrition class 3-4 weeks prior to your surgery.

## 2011-08-12 ENCOUNTER — Encounter: Payer: Self-pay | Admitting: *Deleted

## 2011-08-12 NOTE — Progress Notes (Addendum)
Patient was seen on 08/11/11 for Pre-Operative Gastric Bypass Nutrition Assessment. Assessment and letter of approval faxed to [facility] Surgery Bariatric Surgery Program coordinator on 08/15/11.   TANITA  BODY COMP RESULTS  08/11/11   BMI (kg/m^2) 35.1   Fat Mass (lbs) 93.5   Fat Free Mass (lbs) 104.5   Total Body Water (lbs) 76.5   Handouts given during visit include:  Pre-Op Goals Handout  Patient to call the [facility] to schedule Pre-Op and Post-Op Nutrition Education when surgery date is advised.

## 2011-08-12 NOTE — Progress Notes (Signed)
Addended by: Alain Honey S on: 08/12/2011 08:33 PM   Modules accepted: Orders

## 2011-08-16 ENCOUNTER — Other Ambulatory Visit: Payer: Self-pay

## 2011-08-16 ENCOUNTER — Ambulatory Visit (HOSPITAL_COMMUNITY)
Admission: RE | Admit: 2011-08-16 | Discharge: 2011-08-16 | Disposition: A | Discharge status: Home / Self Care | Payer: 59 | Source: Ambulatory Visit | Attending: General Surgery | Admitting: General Surgery

## 2011-08-16 DIAGNOSIS — E119 Type 2 diabetes mellitus without complications: Secondary | ICD-10-CM | POA: Insufficient documentation

## 2011-08-16 DIAGNOSIS — G4730 Sleep apnea, unspecified: Secondary | ICD-10-CM | POA: Insufficient documentation

## 2011-08-16 DIAGNOSIS — Z6835 Body mass index (BMI) 35.0-35.9, adult: Secondary | ICD-10-CM | POA: Insufficient documentation

## 2011-08-16 DIAGNOSIS — E7800 Pure hypercholesterolemia, unspecified: Secondary | ICD-10-CM | POA: Insufficient documentation

## 2011-08-16 DIAGNOSIS — I10 Essential (primary) hypertension: Secondary | ICD-10-CM | POA: Insufficient documentation

## 2011-08-23 ENCOUNTER — Other Ambulatory Visit: Payer: Self-pay | Admitting: Adult Health

## 2011-08-23 ENCOUNTER — Other Ambulatory Visit (HOSPITAL_COMMUNITY)
Admission: RE | Admit: 2011-08-23 | Discharge: 2011-08-23 | Disposition: A | Discharge status: Home / Self Care | Payer: 59 | Source: Ambulatory Visit | Attending: Obstetrics and Gynecology | Admitting: Obstetrics and Gynecology

## 2011-08-23 DIAGNOSIS — Z01419 Encounter for gynecological examination (general) (routine) without abnormal findings: Secondary | ICD-10-CM | POA: Insufficient documentation

## 2012-03-14 ENCOUNTER — Other Ambulatory Visit: Payer: Self-pay | Admitting: Obstetrics and Gynecology

## 2012-03-14 DIAGNOSIS — Z139 Encounter for screening, unspecified: Secondary | ICD-10-CM

## 2012-04-11 ENCOUNTER — Other Ambulatory Visit (INDEPENDENT_AMBULATORY_CARE_PROVIDER_SITE_OTHER): Payer: Self-pay | Admitting: General Surgery

## 2012-04-11 MED ORDER — METRONIDAZOLE IVPB CUSTOM: 500.0000 mg | Freq: Once | INTRAVENOUS | Status: DC

## 2012-04-12 ENCOUNTER — Encounter: Payer: 59 | Attending: General Surgery | Admitting: *Deleted

## 2012-04-12 VITALS — Ht 63.0 in | Wt 200.0 lb

## 2012-04-12 DIAGNOSIS — Z01818 Encounter for other preprocedural examination: Secondary | ICD-10-CM | POA: Insufficient documentation

## 2012-04-12 DIAGNOSIS — Z713 Dietary counseling and surveillance: Secondary | ICD-10-CM | POA: Insufficient documentation

## 2012-04-12 DIAGNOSIS — E669 Obesity, unspecified: Secondary | ICD-10-CM

## 2012-04-14 NOTE — Progress Notes (Signed)
Bariatric Class:  Appt start time: 0830 end time:  0930.  Pre-Operative Nutrition Class  Patient was seen on 04/12/12 for Pre-Operative Bariatric Surgery Education at [facility].   Surgery date: 05/01/12 Surgery type: RYGB Start weight at [facility]:  200.2 lb (08/11/11)  Weight today: 200.0 lb Weight change: 0.2 lb Total weight lost: 0.2 lb BMI: 35.4   Samples given per MNT protocol: Celebrate Vitamins Multivitamin Lot # [id number] Exp: 09/14  Celebrate Vitamins Multivitamin-Complete Lot # [id number] Exp: 11/14  Opurity Calcium Citrate Lot # [id number] Exp: 11/14  Celebrate Vitamins Sublingual B12 Lot # [id number] Exp:05/15  Unjury Protein Powder Lot # [id number] Exp: 12/14  The following the learning objective met by the patient during this course:  Identifies Pre-Op Dietary Goals and will begin 2 weeks pre-operatively  Identifies appropriate sources of fluids and proteins   States protein recommendations and appropriate sources pre and post-operatively  Identifies Post-Operative Dietary Goals and will follow for 2 weeks post-operatively  Identifies appropriate multivitamin and calcium sources  Describes the need for physical activity post-operatively and will follow MD recommendations  States when to call healthcare provider regarding medication questions or post-operative complications  Handouts given during class include:  Pre-Op Bariatric Surgery Diet Handout  Protein Shake Handout  Post-Op Bariatric Surgery Nutrition Handout  BELT Program Information Flyer  Support Group Information Flyer  Follow-Up Plan: Patient will follow-up at [facility] 2 weeks post operatively for diet advancement per MD.

## 2012-04-15 ENCOUNTER — Encounter: Payer: Self-pay | Admitting: *Deleted

## 2012-04-15 NOTE — Patient Instructions (Signed)
Follow:   Pre-Op Diet per MD 2 weeks prior to surgery  Phase 2- Liquids (clear/full) 2 weeks after surgery  Vitamin/Mineral/Calcium guidelines for purchasing bariatric supplements  Exercise guidelines pre and post-op per MD  Follow-up at [facility] in 2 weeks post-op for diet advancement. Contact Deon Ivey as needed with questions/concerns. 

## 2012-04-16 ENCOUNTER — Ambulatory Visit (HOSPITAL_COMMUNITY)
Admission: RE | Admit: 2012-04-16 | Discharge: 2012-04-16 | Disposition: A | Discharge status: Home / Self Care | Payer: 59 | Source: Ambulatory Visit | Attending: Obstetrics and Gynecology | Admitting: Obstetrics and Gynecology

## 2012-04-16 DIAGNOSIS — Z139 Encounter for screening, unspecified: Secondary | ICD-10-CM

## 2012-04-16 DIAGNOSIS — Z1231 Encounter for screening mammogram for malignant neoplasm of breast: Secondary | ICD-10-CM | POA: Insufficient documentation

## 2012-04-17 ENCOUNTER — Encounter (HOSPITAL_COMMUNITY): Payer: Self-pay | Admitting: Pharmacy Technician

## 2012-04-17 NOTE — Progress Notes (Signed)
1455 EKG 08-16-11 in Epic

## 2012-04-18 ENCOUNTER — Ambulatory Visit (HOSPITAL_COMMUNITY)
Admission: RE | Admit: 2012-04-18 | Discharge: 2012-04-18 | Disposition: A | Discharge status: Home / Self Care | Payer: 59 | Source: Ambulatory Visit | Attending: General Surgery | Admitting: General Surgery

## 2012-04-18 ENCOUNTER — Encounter (HOSPITAL_COMMUNITY): Payer: Self-pay

## 2012-04-18 ENCOUNTER — Encounter (HOSPITAL_COMMUNITY)
Admission: RE | Admit: 2012-04-18 | Discharge: 2012-04-18 | Disposition: A | Discharge status: Home / Self Care | Payer: 59 | Source: Ambulatory Visit | Attending: General Surgery | Admitting: General Surgery

## 2012-04-18 DIAGNOSIS — Z01812 Encounter for preprocedural laboratory examination: Secondary | ICD-10-CM | POA: Insufficient documentation

## 2012-04-18 DIAGNOSIS — Z01818 Encounter for other preprocedural examination: Secondary | ICD-10-CM | POA: Insufficient documentation

## 2012-04-18 DIAGNOSIS — E119 Type 2 diabetes mellitus without complications: Secondary | ICD-10-CM | POA: Insufficient documentation

## 2012-04-18 DIAGNOSIS — I10 Essential (primary) hypertension: Secondary | ICD-10-CM | POA: Insufficient documentation

## 2012-04-18 HISTORY — DX: Nausea with vomiting, unspecified: R11.2

## 2012-04-18 HISTORY — DX: Depression, unspecified: F32.A

## 2012-04-18 HISTORY — DX: Other specified postprocedural states: Z98.890

## 2012-04-18 HISTORY — DX: Major depressive disorder, single episode, unspecified: F32.9

## 2012-04-18 LAB — SURGICAL PCR SCREEN: MRSA, PCR: NEGATIVE

## 2012-04-18 LAB — CBC WITH DIFFERENTIAL/PLATELET
Basophils Relative: 0 % (ref 0–1)
HCT: 39.8 % (ref 36.0–46.0)
Hemoglobin: 13.7 g/dL (ref 12.0–15.0)
Lymphocytes Relative: 39 % (ref 12–46)
Lymphs Abs: 3.2 10*3/uL (ref 0.7–4.0)
MCHC: 34.4 g/dL (ref 30.0–36.0)
Monocytes Absolute: 0.6 10*3/uL (ref 0.1–1.0)
Monocytes Relative: 8 % (ref 3–12)
Neutro Abs: 4 10*3/uL (ref 1.7–7.7)
Neutrophils Relative %: 49 % (ref 43–77)
RBC: 4.28 MIL/uL (ref 3.87–5.11)
WBC: 8.2 10*3/uL (ref 4.0–10.5)

## 2012-04-18 LAB — COMPREHENSIVE METABOLIC PANEL
Albumin: 4.2 g/dL (ref 3.5–5.2)
Alkaline Phosphatase: 56 U/L (ref 39–117)
BUN: 24 mg/dL — ABNORMAL HIGH (ref 6–23)
CO2: 24 mEq/L (ref 19–32)
Chloride: 101 mEq/L (ref 96–112)
GFR calc non Af Amer: >90 mL/min (ref >90)
Potassium: 3.9 mEq/L (ref 3.5–5.1)
Total Bilirubin: 0.5 mg/dL (ref 0.3–1.2)

## 2012-04-18 NOTE — Patient Instructions (Addendum)
20 Torian S Buley  04/18/2012   Your procedure is scheduled on:  05-01-2012  Report to [facility] at 0800 AM.  Call this number if you have problems the morning of surgery: [phone]   Remember:   Do not eat food or drink liquids:After Midnight.  .  Take these medicines the morning of surgery with A SIP OF WATER: zytrec, restasis eye drop, fluvoxamine, isopto tears, omeprazole   Do not wear jewelry or make up.  Do not wear lotions, powders, or perfumes. You may wear deodorant.    Do not bring valuables to the hospital.  Contacts, dentures or bridgework may not be worn into surgery.  Leave suitcase in the car. After surgery it may be brought to your room.  For patients admitted to the hospital, checkout time is 11:00 AM the day of discharge                             Patients discharged the day of surgery will not be allowed to drive home. If going home same day of surgery, you must have someone stay with you the first 24 hours at home and arrange for some one to drive you home from hospital.    Special Instructions: See [facility] Preparing for Surgery instruction sheet. Women do not shave legs or underarms for 12 hours before showers. Men may shave face morning of surgery.    Please read over the following fact sheets that you were given: MRSA Information  Cain Sieve [facility] pre op nurse phone number [phone], call if needed

## 2012-04-19 ENCOUNTER — Ambulatory Visit (INDEPENDENT_AMBULATORY_CARE_PROVIDER_SITE_OTHER): Payer: 59 | Admitting: General Surgery

## 2012-04-19 ENCOUNTER — Encounter (INDEPENDENT_AMBULATORY_CARE_PROVIDER_SITE_OTHER): Payer: Self-pay | Admitting: General Surgery

## 2012-04-19 VITALS — BP 136/98 | HR 74 | Temp 97.8°F | Resp 16 | Ht 63.0 in | Wt 199.0 lb

## 2012-04-19 DIAGNOSIS — E669 Obesity, unspecified: Secondary | ICD-10-CM

## 2012-04-19 NOTE — Progress Notes (Signed)
Patient ID: Melody Johnson, female   DOB: [birth year], 46 y.o.   MRN: [id number]  Chief Complaint  Patient presents with  . Bariatric Pre-op    HPI Melody Johnson is a 46 y.o. female.  this patient presents for preoperative evaluation for weight loss surgery and Roux-en-Y gastric bypass. She has a BMI of 36 with comorbidities of hypertension, hyperlipidemia, diabetes mellitus, reflux, and sleep apnea. She has done her supervised weight loss and been cleared by the psychologist and the nutritionist and is already starting the preoperative diet. HPI  Past Medical History  Diagnosis Date  . Hypertension   . High cholesterol   . Arthritis   . GERD (gastroesophageal reflux disease)   . Diabetes mellitus 05/2011  . Depression   . PONV (postoperative nausea and vomiting)   . Obstructive sleep apnea     cpap setting of 12 per [facility] sleep, heated humdifier epr=3    Past Surgical History  Procedure Date  . Cervical spine surgery 2000  . Lasik 1996  . Lithotripsy 05/2011  . Cholecystectomy 2002    Family History  Problem Relation Age of Onset  . Cancer Sister     cervical  . Cancer Maternal Grandmother     colon  . Hyperlipidemia Other   . Hypertension Other   . Stroke Other     Social History History  Substance Use Topics  . Smoking status: Never Smoker   . Smokeless tobacco: Never Used  . Alcohol Use: No     None since DM dx    Allergies  Allergen Reactions  . Bacitracin-Polymyxin B Other (See Comments)    Will actually cause infection instead of healing the area it has been applied to.  . Codeine Nausea Only  . Neomycin-Bacitracin Zn-Polymyx Other (See Comments)    Will actually cause infection instead of healing the area it has been applied to.  . Penicillins Rash    Starts at feet and works its way up the body.   . Sulfonamide Derivatives     REACTION: UNKNOWN REACTION    Current Outpatient Prescriptions  Medication Sig Dispense Refill  .  aspirin EC 81 MG tablet Take 81 mg by mouth daily.      . Calcium Citrate-Vitamin D (CITRACAL PETITES/VITAMIN D) 200-250 MG-UNIT TABS Take 2 tablets by mouth 2 (two) times daily.      . cetirizine (ZYRTEC) 10 MG tablet Take 10 mg by mouth every morning.       . Cyanocobalamin (VITAMIN B 12 PO) Take 500 mg by mouth daily.      . cycloSPORINE (RESTASIS) 0.05 % ophthalmic emulsion Place 1 drop into both eyes 2 (two) times daily.      . fish oil-omega-3 fatty acids 1000 MG capsule Take 2 g by mouth daily.       . fluvoxaMINE (LUVOX) 100 MG tablet Take 100 mg by mouth every morning.       . hydroxypropyl methylcellulose (ISOPTO TEARS) 2.5 % ophthalmic solution Place 1 drop into both eyes as needed. Dry eyes      . metFORMIN (GLUCOPHAGE) 500 MG tablet Take 1,000 mg by mouth 2 (two) times daily.       . mirtazapine (REMERON) 15 MG tablet Take 15 mg by mouth at bedtime.       . Multiple Vitamins-Calcium (ONE-A-DAY WOMENS PO) Take 1 tablet by mouth daily.      Marland Kitchen omeprazole (PRILOSEC) 20 MG capsule Take 20 mg by mouth  every morning.       . penciclovir (DENAVIR) 1 % cream Apply 1 application topically as needed. Only for outbreaks      . ramipril (ALTACE) 10 MG capsule Take 10 mg by mouth daily before breakfast.       . simvastatin (ZOCOR) 40 MG tablet Take 40 mg by mouth at bedtime.       . valACYclovir (VALTREX) 500 MG tablet Take 500 mg by mouth every morning.         Review of Systems Review of Systems All other review of systems negative or noncontributory except as stated in the HPI  Blood pressure 136/98, pulse 74, temperature 97.8 F (36.6 C), temperature source Temporal, resp. rate 16, height 5\' 3"  (1.6 m), weight 199 lb (90.266 kg).  Physical Exam Physical Exam Physical Exam  Nursing note and vitals reviewed. Constitutional: She is oriented to person, place, and time. She appears well-developed and well-nourished. No distress.  HENT:  Head: Normocephalic and atraumatic.  Mouth/Throat:  No oropharyngeal exudate.  Eyes: Conjunctivae and EOM are normal. Pupils are equal, round, and reactive to light. Right eye exhibits no discharge. Left eye exhibits no discharge. No scleral icterus.  Neck: Normal range of motion. Neck supple. No tracheal deviation present.  Cardiovascular: Normal rate, regular rhythm, normal heart sounds and intact distal pulses.   Pulmonary/Chest: Effort normal and breath sounds normal. No stridor. No respiratory distress. She has no wheezes.  Abdominal: Soft. Bowel sounds are normal. She exhibits no distension and no mass. There is no tenderness. There is no rebound and no guarding.  Musculoskeletal: Normal range of motion. She exhibits no edema and no tenderness.  Neurological: She is alert and oriented to person, place, and time.  Skin: Skin is warm and dry. No rash noted. She is not diaphoretic. No erythema. No pallor.  Psychiatric: She has a normal mood and affect. Her behavior is normal. Judgment and thought content normal.   Data Reviewed Labs, UGI, nutrition and psychology evals  Assessment    Morbid obesity with a BMI of 36and comorbidities of hypertension hyperlipidemia, obstructive sleep apnea, arthritis, diabetes mellitus, and reflux We again discussed the pros and cons of surgery is as well as the expected hospital and operative course. We discussed the procedure and its risks The risks of infection, bleeding, pain, scarring, weight regain, too little or too much weight loss, vitamin deficiencies and need for lifelong vitamin supplementation, hair loss, need for protein supplementation, leaks, stricture, reflux, food intolerance, need for reoperation , need for open surgery, injury to spleen or surrounding structures, DVT's, PE, and death again discussed with the patient and the patient expressed understanding and desires to proceed with laparoscopic RYGB, possible open, intraoperative endoscopy. She remains interested in to gastric bypass and we will  proceed with this as scheduled.     Plan    We will proceed with Roux-en-Y gastric bypass with intraoperative endoscopy as scheduled.       Lodema Pilot DAVID 04/19/2012, 4:03 PM

## 2012-04-25 ENCOUNTER — Telehealth (INDEPENDENT_AMBULATORY_CARE_PROVIDER_SITE_OTHER): Payer: Self-pay | Admitting: General Surgery

## 2012-04-25 NOTE — Telephone Encounter (Signed)
Pt called to verify clear liquids for the day before surgery.  Discussed what constitutes clear liquids and she understands.

## 2012-05-01 ENCOUNTER — Inpatient Hospital Stay (HOSPITAL_COMMUNITY)
Admission: RE | Admit: 2012-05-01 | Discharge: 2012-05-03 | DRG: 621 | Disposition: A | Discharge status: Home / Self Care | Payer: 59 | Source: Ambulatory Visit | Attending: General Surgery | Admitting: General Surgery

## 2012-05-01 ENCOUNTER — Inpatient Hospital Stay (HOSPITAL_COMMUNITY): Payer: 59 | Admitting: Anesthesiology

## 2012-05-01 ENCOUNTER — Encounter (HOSPITAL_COMMUNITY)
Admission: RE | Disposition: A | Discharge status: Home / Self Care | Payer: Self-pay | Source: Ambulatory Visit | Attending: General Surgery

## 2012-05-01 ENCOUNTER — Encounter (HOSPITAL_COMMUNITY): Payer: Self-pay | Admitting: Anesthesiology

## 2012-05-01 ENCOUNTER — Encounter (HOSPITAL_COMMUNITY): Payer: Self-pay | Admitting: *Deleted

## 2012-05-01 DIAGNOSIS — Z880 Allergy status to penicillin: Secondary | ICD-10-CM

## 2012-05-01 DIAGNOSIS — F411 Generalized anxiety disorder: Secondary | ICD-10-CM | POA: Diagnosis present

## 2012-05-01 DIAGNOSIS — G4733 Obstructive sleep apnea (adult) (pediatric): Secondary | ICD-10-CM | POA: Diagnosis present

## 2012-05-01 DIAGNOSIS — I10 Essential (primary) hypertension: Secondary | ICD-10-CM

## 2012-05-01 DIAGNOSIS — E119 Type 2 diabetes mellitus without complications: Secondary | ICD-10-CM

## 2012-05-01 DIAGNOSIS — Z9089 Acquired absence of other organs: Secondary | ICD-10-CM

## 2012-05-01 DIAGNOSIS — Z79899 Other long term (current) drug therapy: Secondary | ICD-10-CM

## 2012-05-01 DIAGNOSIS — Z6836 Body mass index (BMI) 36.0-36.9, adult: Secondary | ICD-10-CM

## 2012-05-01 DIAGNOSIS — K219 Gastro-esophageal reflux disease without esophagitis: Secondary | ICD-10-CM | POA: Diagnosis present

## 2012-05-01 DIAGNOSIS — E785 Hyperlipidemia, unspecified: Secondary | ICD-10-CM | POA: Diagnosis present

## 2012-05-01 DIAGNOSIS — Z6835 Body mass index (BMI) 35.0-35.9, adult: Secondary | ICD-10-CM

## 2012-05-01 DIAGNOSIS — F3289 Other specified depressive episodes: Secondary | ICD-10-CM | POA: Diagnosis present

## 2012-05-01 DIAGNOSIS — M129 Arthropathy, unspecified: Secondary | ICD-10-CM | POA: Diagnosis present

## 2012-05-01 DIAGNOSIS — Z885 Allergy status to narcotic agent status: Secondary | ICD-10-CM

## 2012-05-01 DIAGNOSIS — Z882 Allergy status to sulfonamides status: Secondary | ICD-10-CM

## 2012-05-01 DIAGNOSIS — F329 Major depressive disorder, single episode, unspecified: Secondary | ICD-10-CM | POA: Diagnosis present

## 2012-05-01 DIAGNOSIS — Z7982 Long term (current) use of aspirin: Secondary | ICD-10-CM

## 2012-05-01 DIAGNOSIS — Z888 Allergy status to other drugs, medicaments and biological substances status: Secondary | ICD-10-CM

## 2012-05-01 HISTORY — PX: GASTRIC ROUX-EN-Y: SHX5262

## 2012-05-01 LAB — GLUCOSE, CAPILLARY

## 2012-05-01 SURGERY — LAPAROSCOPIC ROUX-EN-Y GASTRIC BYPASS WITH UPPER ENDOSCOPY
Anesthesia: General | Site: Abdomen | Wound class: Clean Contaminated

## 2012-05-01 MED ORDER — LACTATED RINGERS IV SOLN
INTRAVENOUS | Status: DC | PRN
  Administered 2012-05-01 (×2): via INTRAVENOUS

## 2012-05-01 MED ORDER — HYPROMELLOSE (GONIOSCOPIC) 2.5 % OP SOLN
1.0000 [drp] | OPHTHALMIC | Status: DC | PRN
  Filled 2012-05-01: qty 15

## 2012-05-01 MED ORDER — MIDAZOLAM HCL 5 MG/5ML IJ SOLN
INTRAMUSCULAR | Status: DC | PRN
  Administered 2012-05-01 (×2): 1 mg via INTRAVENOUS

## 2012-05-01 MED ORDER — BUPIVACAINE HCL 0.25 % IJ SOLN
INTRAMUSCULAR | Status: DC | PRN
  Administered 2012-05-01: 20 mL

## 2012-05-01 MED ORDER — ENOXAPARIN SODIUM 40 MG/0.4ML ~~LOC~~ SOLN
40.0000 mg | SUBCUTANEOUS | Status: DC
  Administered 2012-05-02: 40 mg via SUBCUTANEOUS
  Filled 2012-05-01 (×2): qty 0.4

## 2012-05-01 MED ORDER — ONDANSETRON HCL 4 MG/2ML IJ SOLN
INTRAMUSCULAR | Status: DC | PRN
  Administered 2012-05-01: 4 mg via INTRAVENOUS

## 2012-05-01 MED ORDER — LIDOCAINE HCL (CARDIAC) 20 MG/ML IV SOLN
INTRAVENOUS | Status: DC | PRN
  Administered 2012-05-01: 50 mg via INTRAVENOUS

## 2012-05-01 MED ORDER — KCL IN DEXTROSE-NACL 20-5-0.45 MEQ/L-%-% IV SOLN
INTRAVENOUS | Status: DC
  Administered 2012-05-01 – 2012-05-02 (×4): via INTRAVENOUS
  Filled 2012-05-01 (×8): qty 1000

## 2012-05-01 MED ORDER — LACTATED RINGERS IR SOLN
Status: DC | PRN
  Administered 2012-05-01: 3000 mL

## 2012-05-01 MED ORDER — HYDROMORPHONE HCL PF 1 MG/ML IJ SOLN
INTRAMUSCULAR | Status: DC | PRN
  Administered 2012-05-01: 0.5 mg via INTRAVENOUS
  Administered 2012-05-01: 1 mg via INTRAVENOUS
  Administered 2012-05-01: 0.5 mg via INTRAVENOUS

## 2012-05-01 MED ORDER — ONDANSETRON HCL 4 MG/2ML IJ SOLN: 4.0000 mg | INTRAMUSCULAR | Status: DC | PRN

## 2012-05-01 MED ORDER — HYDROMORPHONE HCL PF 1 MG/ML IJ SOLN: 0.2500 mg | INTRAMUSCULAR | Status: DC | PRN

## 2012-05-01 MED ORDER — POLYVINYL ALCOHOL 1.4 % OP SOLN
1.0000 [drp] | OPHTHALMIC | Status: DC | PRN
  Filled 2012-05-01: qty 15

## 2012-05-01 MED ORDER — KETOROLAC TROMETHAMINE 30 MG/ML IJ SOLN
30.0000 mg | Freq: Four times a day (QID) | INTRAMUSCULAR | Status: DC | PRN
  Administered 2012-05-01: 30 mg via INTRAVENOUS
  Filled 2012-05-01: qty 1

## 2012-05-01 MED ORDER — MORPHINE SULFATE 2 MG/ML IJ SOLN
2.0000 mg | INTRAMUSCULAR | Status: DC | PRN
  Administered 2012-05-01 – 2012-05-02 (×2): 2 mg via INTRAVENOUS
  Filled 2012-05-01 (×2): qty 1

## 2012-05-01 MED ORDER — LIDOCAINE-EPINEPHRINE 1 %-1:100000 IJ SOLN
INTRAMUSCULAR | Status: DC | PRN
  Administered 2012-05-01: 20 mL

## 2012-05-01 MED ORDER — OXYCODONE-ACETAMINOPHEN 5-325 MG/5ML PO SOLN
5.0000 mL | ORAL | Status: DC | PRN
  Administered 2012-05-02 (×2): 5 mL via ORAL
  Administered 2012-05-02: 10 mL via ORAL
  Administered 2012-05-03: 5 mL via ORAL
  Administered 2012-05-03: 10 mL via ORAL
  Filled 2012-05-01 (×3): qty 5
  Filled 2012-05-01 (×2): qty 10

## 2012-05-01 MED ORDER — INSULIN ASPART 100 UNIT/ML ~~LOC~~ SOLN
0.0000 [IU] | SUBCUTANEOUS | Status: DC
  Administered 2012-05-01: 3 [IU] via SUBCUTANEOUS
  Administered 2012-05-01 – 2012-05-03 (×4): 2 [IU] via SUBCUTANEOUS

## 2012-05-01 MED ORDER — HEPARIN SODIUM (PORCINE) 5000 UNIT/ML IJ SOLN
5000.0000 [IU] | Freq: Once | INTRAMUSCULAR | Status: AC
  Administered 2012-05-01: 5000 [IU] via SUBCUTANEOUS
  Filled 2012-05-01: qty 1

## 2012-05-01 MED ORDER — FENTANYL CITRATE 0.05 MG/ML IJ SOLN
INTRAMUSCULAR | Status: DC | PRN
  Administered 2012-05-01 (×2): 50 ug via INTRAVENOUS
  Administered 2012-05-01: 100 ug via INTRAVENOUS

## 2012-05-01 MED ORDER — PROPOFOL 10 MG/ML IV BOLUS
INTRAVENOUS | Status: DC | PRN
  Administered 2012-05-01: 180 mg via INTRAVENOUS

## 2012-05-01 MED ORDER — PROMETHAZINE HCL 25 MG/ML IJ SOLN
12.5000 mg | INTRAMUSCULAR | Status: DC | PRN
Start: 2012-05-01 — End: 2012-05-01

## 2012-05-01 MED ORDER — ACETAMINOPHEN 160 MG/5ML PO SOLN: 650.0000 mg | ORAL | Status: DC | PRN

## 2012-05-01 MED ORDER — LACTATED RINGERS IV SOLN: INTRAVENOUS | Status: DC

## 2012-05-01 MED ORDER — GLYCOPYRROLATE 0.2 MG/ML IJ SOLN
INTRAMUSCULAR | Status: DC | PRN
  Administered 2012-05-01: .8 mg via INTRAVENOUS

## 2012-05-01 MED ORDER — NEOSTIGMINE METHYLSULFATE 1 MG/ML IJ SOLN
INTRAMUSCULAR | Status: DC | PRN
  Administered 2012-05-01: 5 mg via INTRAVENOUS

## 2012-05-01 MED ORDER — PROMETHAZINE HCL 25 MG/ML IJ SOLN
6.2500 mg | INTRAMUSCULAR | Status: DC | PRN
  Administered 2012-05-01: 12.5 mg via INTRAVENOUS

## 2012-05-01 MED ORDER — CIPROFLOXACIN IN D5W 400 MG/200ML IV SOLN
400.0000 mg | INTRAVENOUS | Status: AC
  Administered 2012-05-01: 400 mg via INTRAVENOUS
  Filled 2012-05-01: qty 200

## 2012-05-01 MED ORDER — ROCURONIUM BROMIDE 100 MG/10ML IV SOLN
INTRAVENOUS | Status: DC | PRN
  Administered 2012-05-01: 50 mg via INTRAVENOUS
  Administered 2012-05-01: 10 mg via INTRAVENOUS

## 2012-05-01 MED ORDER — CYCLOSPORINE 0.05 % OP EMUL
1.0000 [drp] | Freq: Two times a day (BID) | OPHTHALMIC | Status: DC
  Administered 2012-05-01 – 2012-05-02 (×3): 1 [drp] via OPHTHALMIC
  Filled 2012-05-01 (×5): qty 1

## 2012-05-01 SURGICAL SUPPLY — 74 items
APL SRG 32X5 SNPLK LF DISP (MISCELLANEOUS)
APPLIER CLIP ROT 13.4 12 LRG (CLIP)
APPLIER CLIP UNV 5X34 EPIX (ENDOMECHANICALS) IMPLANT
APR CLP LRG 13.4X12 ROT 20 MLT (CLIP)
APR XCLPCLP 20M/L UNV 34X5 (ENDOMECHANICALS)
BAG SPEC RTRVL LRG 6X4 10 (ENDOMECHANICALS)
BLADE SURG 15 STRL LF DISP TIS (BLADE) ×1 IMPLANT
BLADE SURG 15 STRL SS (BLADE) ×2
CABLE HIGH FREQUENCY MONO STRZ (ELECTRODE) ×2 IMPLANT
CANISTER SUCTION 2500CC (MISCELLANEOUS) ×2 IMPLANT
CHLORAPREP W/TINT 26ML (MISCELLANEOUS) ×4 IMPLANT
CLIP APPLIE ROT 13.4 12 LRG (CLIP) IMPLANT
CLOTH BEACON ORANGE TIMEOUT ST (SAFETY) ×2 IMPLANT
COVER PROBE U/S 5X48 (MISCELLANEOUS) ×2 IMPLANT
COVER SURGICAL LIGHT HANDLE (MISCELLANEOUS) ×2 IMPLANT
DEVICE SUTURE ENDOST 10MM (ENDOMECHANICALS) ×2 IMPLANT
DISSECTOR BLUNT TIP ENDO 5MM (MISCELLANEOUS) IMPLANT
DRAIN CHANNEL 19F RND (DRAIN) ×2 IMPLANT
DRAPE CAMERA CLOSED 9X96 (DRAPES) ×2 IMPLANT
EVACUATOR DRAINAGE 10X20 100CC (DRAIN) ×1 IMPLANT
EVACUATOR SILICONE 100CC (DRAIN) ×2
GAUZE SPONGE 4X4 16PLY XRAY LF (GAUZE/BANDAGES/DRESSINGS) ×2 IMPLANT
GLOVE SURG SS PI 7.5 STRL IVOR (GLOVE) ×4 IMPLANT
GOWN STRL NON-REIN LRG LVL3 (GOWN DISPOSABLE) ×2 IMPLANT
GOWN STRL REIN XL XLG (GOWN DISPOSABLE) ×6 IMPLANT
HOVERMATT SINGLE USE (MISCELLANEOUS) ×2 IMPLANT
KIT BASIN OR (CUSTOM PROCEDURE TRAY) ×2 IMPLANT
KIT GASTRIC LAVAGE 34FR ADT (SET/KITS/TRAYS/PACK) ×2 IMPLANT
NDL SPNL 22GX3.5 QUINCKE BK (NEEDLE) ×1 IMPLANT
NEEDLE SPNL 22GX3.5 QUINCKE BK (NEEDLE) ×2 IMPLANT
NS IRRIG 1000ML POUR BTL (IV SOLUTION) ×2 IMPLANT
PACK CARDIOVASCULAR III (CUSTOM PROCEDURE TRAY) ×2 IMPLANT
PEN SKIN MARKING BROAD (MISCELLANEOUS) ×2 IMPLANT
PENCIL BUTTON HOLSTER BLD 10FT (ELECTRODE) ×2 IMPLANT
POUCH SPECIMEN RETRIEVAL 10MM (ENDOMECHANICALS) ×1 IMPLANT
RELOAD BLUE (STAPLE) ×2 IMPLANT
RELOAD ENDO STITCH (ENDOMECHANICALS) ×10 IMPLANT
RELOAD ENDO STITCH 2.0 (ENDOMECHANICALS)
RELOAD GOLD (STAPLE) ×6 IMPLANT
RELOAD SUT SNGL STCH ABSRB 2-0 (ENDOMECHANICALS) IMPLANT
RELOAD SUT SNGL STCH BLK 2-0 (ENDOMECHANICALS) IMPLANT
RELOAD SUT TRIPLE-STITCH 2-0 (ENDOMECHANICALS) ×3 IMPLANT
RELOAD WHITE ECR60W (STAPLE) ×6 IMPLANT
SCISSORS LAP 5X45 EPIX DISP (ENDOMECHANICALS) ×2 IMPLANT
SEALANT SURGICAL APPL DUAL CAN (MISCELLANEOUS) IMPLANT
SET IRRIG TUBING LAPAROSCOPIC (IRRIGATION / IRRIGATOR) ×2 IMPLANT
SHEARS CURVED HARMONIC AC 45CM (MISCELLANEOUS) ×2 IMPLANT
SOLUTION ANTI FOG 6CC (MISCELLANEOUS) ×2 IMPLANT
SPONGE GAUZE 4X4 12PLY (GAUZE/BANDAGES/DRESSINGS) IMPLANT
STAPLE ECHEON FLEX 60 POW ENDO (STAPLE) ×2 IMPLANT
STAPLER CIRC 21 3.5 SULU (STAPLE) ×2 IMPLANT
STAPLER TRANS-ORAL 21MM DST (STAPLE) ×2 IMPLANT
STAPLER VISISTAT 35W (STAPLE) ×2 IMPLANT
STRIP CLOSURE SKIN 1/2X4 (GAUZE/BANDAGES/DRESSINGS) IMPLANT
STRIP PERI DRY VERITAS 60 (STAPLE) ×7 IMPLANT
SUT ETHILON 3 0 PS 1 (SUTURE) ×2 IMPLANT
SUT MNCRL AB 4-0 PS2 18 (SUTURE) ×3 IMPLANT
SUT RELOAD ENDO STITCH 2 48X1 (ENDOMECHANICALS)
SUT RELOAD ENDO STITCH 2.0 (ENDOMECHANICALS)
SUT VICRYL 0 UR6 27IN ABS (SUTURE) ×4 IMPLANT
SUTURE RELOAD END STTCH 2 48X1 (ENDOMECHANICALS) IMPLANT
SUTURE RELOAD ENDO STITCH 2.0 (ENDOMECHANICALS) IMPLANT
SYR 20CC LL (SYRINGE) ×2 IMPLANT
SYR 50ML LL SCALE MARK (SYRINGE) ×2 IMPLANT
SYS KII OPTICAL ACCESS 15MM (TROCAR) ×2
SYSTEM KII OPTICAL ACCESS 15MM (TROCAR) ×1 IMPLANT
TOWEL OR 17X26 10 PK STRL BLUE (TOWEL DISPOSABLE) ×4 IMPLANT
TRAY FOLEY CATH 14FRSI W/METER (CATHETERS) ×2 IMPLANT
TROCAR XCEL BLADELESS 5X75MML (TROCAR) ×2 IMPLANT
TROCAR XCEL NON-BLD 5MMX100MML (ENDOMECHANICALS) ×6 IMPLANT
TROCAR Z-THREAD BLADED 5X100MM (TROCAR) ×2 IMPLANT
TUBING CONNECTING 10 (TUBING) ×2 IMPLANT
TUBING ENDO SMARTCAP (MISCELLANEOUS) ×2 IMPLANT
TUBING FILTER THERMOFLATOR (ELECTROSURGICAL) IMPLANT

## 2012-05-01 NOTE — Progress Notes (Signed)
Spoke to Dr. Biagio Quint via OR RN, informed that patient O2 sat in 80s on 2L O2, increased to 3L and still in 80's, tried cough and deep breathe and IS, patient unlabored. O2 sat will go up to 90s then back to 80s, MD states to bump O2 up to 4l and he will be up to see patient shortly. Orders for CPAP from home also given, RT called and states since patient becoming sleepy will put patient on CPAP. MD also notified HR in 110s.

## 2012-05-01 NOTE — Anesthesia Postprocedure Evaluation (Signed)
  Anesthesia Post-op Note  Patient: Melody Johnson  Procedure(s) Performed: Procedure(s) (LRB): LAPAROSCOPIC ROUX-EN-Y GASTRIC BYPASS WITH UPPER ENDOSCOPY (N/A)  Patient Location: PACU  Anesthesia Type: General  Level of Consciousness: awake and alert   Airway and Oxygen Therapy: Patient Spontanous Breathing  Post-op Pain: mild  Post-op Assessment: Post-op Vital signs reviewed, Patient's Cardiovascular Status Stable, Respiratory Function Stable, Patent Airway and No signs of Nausea or vomiting  Post-op Vital Signs: stable  Complications: No apparent anesthesia complications

## 2012-05-01 NOTE — Progress Notes (Signed)
Patient refuses CPAP at this time. RT informed patient to call at any time if she changed her mind.

## 2012-05-01 NOTE — H&P (View-Only) (Signed)
Patient ID: Melody Johnson, female   DOB: 12/06/1965, 46 y.o.   MRN: [id number]  Chief Complaint  Patient presents with  . Bariatric Pre-op    HPI Melody Johnson is a 46 y.o. female.  this patient presents for preoperative evaluation for weight loss surgery and Roux-en-Y gastric bypass. She has a BMI of 36 with comorbidities of hypertension, hyperlipidemia, diabetes mellitus, reflux, and sleep apnea. She has done her supervised weight loss and been cleared by the psychologist and the nutritionist and is already starting the preoperative diet. HPI  Past Medical History  Diagnosis Date  . Hypertension   . High cholesterol   . Arthritis   . GERD (gastroesophageal reflux disease)   . Diabetes mellitus 05/2011  . Depression   . PONV (postoperative nausea and vomiting)   . Obstructive sleep apnea     cpap setting of 12 per [facility], heated humdifier epr=3    Past Surgical History  Procedure Date  . Cervical spine surgery 2000  . Lasik 1996  . Lithotripsy 05/2011  . Cholecystectomy 2002    Family History  Problem Relation Age of Onset  . Cancer Sister     cervical  . Cancer Maternal Grandmother     colon  . Hyperlipidemia Other   . Hypertension Other   . Stroke Other     Social History History  Substance Use Topics  . Smoking status: Never Smoker   . Smokeless tobacco: Never Used  . Alcohol Use: No     None since DM dx    Allergies  Allergen Reactions  . Bacitracin-Polymyxin B Other (See Comments)    Will actually cause infection instead of healing the area it has been applied to.  . Codeine Nausea Only  . Neomycin-Bacitracin Zn-Polymyx Other (See Comments)    Will actually cause infection instead of healing the area it has been applied to.  . Penicillins Rash    Starts at feet and works its way up the body.   . Sulfonamide Derivatives     REACTION: UNKNOWN REACTION    Current Outpatient Prescriptions  Medication Sig Dispense Refill  .  aspirin EC 81 MG tablet Take 81 mg by mouth daily.      . Calcium Citrate-Vitamin D (CITRACAL PETITES/VITAMIN D) 200-250 MG-UNIT TABS Take 2 tablets by mouth 2 (two) times daily.      . cetirizine (ZYRTEC) 10 MG tablet Take 10 mg by mouth every morning.       . Cyanocobalamin (VITAMIN B 12 PO) Take 500 mg by mouth daily.      . cycloSPORINE (RESTASIS) 0.05 % ophthalmic emulsion Place 1 drop into both eyes 2 (two) times daily.      . fish oil-omega-3 fatty acids 1000 MG capsule Take 2 g by mouth daily.       . fluvoxaMINE (LUVOX) 100 MG tablet Take 100 mg by mouth every morning.       . hydroxypropyl methylcellulose (ISOPTO TEARS) 2.5 % ophthalmic solution Place 1 drop into both eyes as needed. Dry eyes      . metFORMIN (GLUCOPHAGE) 500 MG tablet Take 1,000 mg by mouth 2 (two) times daily.       . mirtazapine (REMERON) 15 MG tablet Take 15 mg by mouth at bedtime.       . Multiple Vitamins-Calcium (ONE-A-DAY WOMENS PO) Take 1 tablet by mouth daily.      . omeprazole (PRILOSEC) 20 MG capsule Take 20 mg by mouth   every morning.       . penciclovir (DENAVIR) 1 % cream Apply 1 application topically as needed. Only for outbreaks      . ramipril (ALTACE) 10 MG capsule Take 10 mg by mouth daily before breakfast.       . simvastatin (ZOCOR) 40 MG tablet Take 40 mg by mouth at bedtime.       . valACYclovir (VALTREX) 500 MG tablet Take 500 mg by mouth every morning.         Review of Systems Review of Systems All other review of systems negative or noncontributory except as stated in the HPI  Blood pressure 136/98, pulse 74, temperature 97.8 F (36.6 C), temperature source Temporal, resp. rate 16, height 5' 3" (1.6 m), weight 199 lb (90.266 kg).  Physical Exam Physical Exam Physical Exam  Nursing note and vitals reviewed. Constitutional: She is oriented to person, place, and time. She appears well-developed and well-nourished. No distress.  HENT:  Head: Normocephalic and atraumatic.  Mouth/Throat:  No oropharyngeal exudate.  Eyes: Conjunctivae and EOM are normal. Pupils are equal, round, and reactive to light. Right eye exhibits no discharge. Left eye exhibits no discharge. No scleral icterus.  Neck: Normal range of motion. Neck supple. No tracheal deviation present.  Cardiovascular: Normal rate, regular rhythm, normal heart sounds and intact distal pulses.   Pulmonary/Chest: Effort normal and breath sounds normal. No stridor. No respiratory distress. She has no wheezes.  Abdominal: Soft. Bowel sounds are normal. She exhibits no distension and no mass. There is no tenderness. There is no rebound and no guarding.  Musculoskeletal: Normal range of motion. She exhibits no edema and no tenderness.  Neurological: She is alert and oriented to person, place, and time.  Skin: Skin is warm and dry. No rash noted. She is not diaphoretic. No erythema. No pallor.  Psychiatric: She has a normal mood and affect. Her behavior is normal. Judgment and thought content normal.   Data Reviewed Labs, UGI, nutrition and psychology evals  Assessment    Morbid obesity with a BMI of 36and comorbidities of hypertension hyperlipidemia, obstructive sleep apnea, arthritis, diabetes mellitus, and reflux We again discussed the pros and cons of surgery is as well as the expected hospital and operative course. We discussed the procedure and its risks The risks of infection, bleeding, pain, scarring, weight regain, too little or too much weight loss, vitamin deficiencies and need for lifelong vitamin supplementation, hair loss, need for protein supplementation, leaks, stricture, reflux, food intolerance, need for reoperation , need for open surgery, injury to spleen or surrounding structures, DVT's, PE, and death again discussed with the patient and the patient expressed understanding and desires to proceed with laparoscopic RYGB, possible open, intraoperative endoscopy. She remains interested in to gastric bypass and we will  proceed with this as scheduled.     Plan    We will proceed with Roux-en-Y gastric bypass with intraoperative endoscopy as scheduled.       Shahed Yeoman DAVID 04/19/2012, 4:03 PM    

## 2012-05-01 NOTE — Progress Notes (Signed)
Placed pt on CPAP per MD order with 4LPM bleed- in O2. Pt has her on mask and tubing. Pt states she is fine on CPAP and doing well. Rt will cont. to monitor.

## 2012-05-01 NOTE — Brief Op Note (Signed)
05/01/2012  2:09 PM  PATIENT:  Ninel S Oglesby  46 y.o. female  PRE-OPERATIVE DIAGNOSIS:  morbid obesity  POST-OPERATIVE DIAGNOSIS:  MORBID OBESITY   PROCEDURE:  Procedure(s) (LRB) with comments: LAPAROSCOPIC ROUX-EN-Y GASTRIC BYPASS WITH UPPER ENDOSCOPY (N/A)  SURGEON:  Surgeon(s) and Role:    * Lodema Pilot, DO - Primary    * Valarie Merino, MD - Assisting  PHYSICIAN ASSISTANT:   ASSISTANTS: martin   ANESTHESIA:   general  EBL:  Total I/O In: 2000 [I.V.:2000] Out: -   BLOOD ADMINISTERED:none  DRAINS: (24F ) Jackson-Pratt drain(s) with closed bulb suction in the GJ anastamosis   LOCAL MEDICATIONS USED:  MARCAINE    and LIDOCAINE   SPECIMEN:  No Specimen  DISPOSITION OF SPECIMEN:  N/A  COUNTS:  YES  TOURNIQUET:  * No tourniquets in log *  DICTATION: .Other Dictation: Dictation Number   PLAN OF CARE: Admit to inpatient   PATIENT DISPOSITION:  PACU - hemodynamically stable.   Delay start of Pharmacological VTE agent (>24hrs) due to surgical blood loss or risk of bleeding: no

## 2012-05-01 NOTE — Interval H&P Note (Signed)
History and Physical Interval Note:  05/01/2012 9:33 AM  Melody Johnson  has presented today for surgery, with the diagnosis of morbid obesity  The various methods of treatment have been discussed with the patient and family. After consideration of risks, benefits and other options for treatment, the patient has consented to  Procedure(s) (LRB) with comments: LAPAROSCOPIC ROUX-EN-Y GASTRIC BYPASS WITH UPPER ENDOSCOPY (N/A) as a surgical intervention .  The patient's history has been reviewed, patient examined, no change in status, stable for surgery.  I have reviewed the patient's chart and labs.  Questions were answered to the patient's satisfaction.  I have seen and evaluated the patient in the preop area.  Risks of the procedure again discussed with the patient in lay terms.  The risks of infection, bleeding, pain, scarring, weight regain, too little or too much weight loss, vitamin deficiencies and need for lifelong vitamin supplementation, hair loss, need for protein supplementation, leaks, stricture, reflux, food intolerance, need for reoperation , need for open surgery, injury to spleen or surrounding structures, DVT's, PE, and death again discussed with the patient and the patient expressed understanding and desires to proceed with laparoscopic RYGB, possible open, intraoperative endoscopy.    Lodema Pilot DAVID

## 2012-05-01 NOTE — Transfer of Care (Signed)
Immediate Anesthesia Transfer of Care Note  Patient: Melody Johnson  Procedure(s) Performed: Procedure(s) (LRB) with comments: LAPAROSCOPIC ROUX-EN-Y GASTRIC BYPASS WITH UPPER ENDOSCOPY (N/A)  Patient Location: PACU  Anesthesia Type:General  Level of Consciousness: awake, alert  and oriented  Airway & Oxygen Therapy: Patient Spontanous Breathing and Patient connected to face mask oxygen  Post-op Assessment: Report given to PACU RN and Post -op Vital signs reviewed and stable  Post vital signs: Reviewed and stable  Complications: No apparent anesthesia complications

## 2012-05-01 NOTE — Anesthesia Preprocedure Evaluation (Addendum)
Anesthesia Evaluation  Patient identified by MRN, date of birth, ID band Patient awake    Reviewed: Allergy & Precautions, H&P , NPO status , Patient's Chart, lab work & pertinent test results  History of Anesthesia Complications (+) PONV  Airway Mallampati: III TM Distance: <3 FB Neck ROM: full    Dental No notable dental hx. (+) Teeth Intact and Dental Advisory Given   Pulmonary sleep apnea and Continuous Positive Airway Pressure Ventilation ,  breath sounds clear to auscultation  Pulmonary exam normal       Cardiovascular hypertension, Pt. on medications Rhythm:regular Rate:Normal     Neuro/Psych Anxiety Depression negative neurological ROS  negative psych ROS   GI/Hepatic negative GI ROS, Neg liver ROS, GERD-  Medicated and Controlled,  Endo/Other  diabetes, Well Controlled, Type 2, Oral Hypoglycemic Agents  Renal/GU negative Renal ROS  negative genitourinary   Musculoskeletal   Abdominal   Peds  Hematology negative hematology ROS (+)   Anesthesia Other Findings   Reproductive/Obstetrics negative OB ROS                          Anesthesia Physical Anesthesia Plan  ASA: III  Anesthesia Plan: General   Post-op Pain Management:    Induction: Intravenous  Airway Management Planned: Oral ETT  Additional Equipment:   Intra-op Plan:   Post-operative Plan: Extubation in OR  Informed Consent: I have reviewed the patients History and Physical, chart, labs and discussed the procedure including the risks, benefits and alternatives for the proposed anesthesia with the patient or authorized representative who has indicated his/her understanding and acceptance.   Dental Advisory Given  Plan Discussed with: CRNA and Surgeon  Anesthesia Plan Comments:         Anesthesia Quick Evaluation

## 2012-05-01 NOTE — Addendum Note (Signed)
Addendum  created 05/01/12 1519 by Gaetano Hawthorne, MD   Modules edited:Orders

## 2012-05-01 NOTE — Progress Notes (Signed)
Called for low sats.  She is drowsy and sedated.  She is easily arousable and sats 98% on 4 l when aroused.  Lungs clear and no distress.  She is breathing comfortably and feels well.  I think that she is sedated from pain meds.  We will try toradol for pain PRN and limit narcotics.  Keep on pulse ox. Will also start her on her home cpap until she is more awake.  To stepdown if any further issues.

## 2012-05-02 ENCOUNTER — Encounter (HOSPITAL_COMMUNITY): Payer: Self-pay | Admitting: General Surgery

## 2012-05-02 ENCOUNTER — Inpatient Hospital Stay (HOSPITAL_COMMUNITY): Payer: 59

## 2012-05-02 LAB — CBC WITH DIFFERENTIAL/PLATELET
Basophils Absolute: 0 10*3/uL (ref 0.0–0.1)
Eosinophils Relative: 0 % (ref 0–5)
HCT: 37.8 % (ref 36.0–46.0)
Hemoglobin: 13.1 g/dL (ref 12.0–15.0)
Lymphocytes Relative: 11 % — ABNORMAL LOW (ref 12–46)
MCV: 94.3 fL (ref 78.0–100.0)
Monocytes Absolute: 1.2 10*3/uL — ABNORMAL HIGH (ref 0.1–1.0)
Monocytes Relative: 6 % (ref 3–12)
RDW: 13.4 % (ref 11.5–15.5)
WBC: 21.1 10*3/uL — ABNORMAL HIGH (ref 4.0–10.5)

## 2012-05-02 LAB — COMPREHENSIVE METABOLIC PANEL
ALT: 161 U/L — ABNORMAL HIGH (ref 0–35)
AST: 110 U/L — ABNORMAL HIGH (ref 0–37)
Alkaline Phosphatase: 56 U/L (ref 39–117)
CO2: 26 mEq/L (ref 19–32)
GFR calc Af Amer: >90 mL/min (ref >90)
GFR calc non Af Amer: >90 mL/min (ref >90)
Glucose, Bld: 158 mg/dL — ABNORMAL HIGH (ref 70–99)
Potassium: 3.4 mEq/L — ABNORMAL LOW (ref 3.5–5.1)
Sodium: 136 mEq/L (ref 135–145)
Total Protein: 6.9 g/dL (ref 6.0–8.3)

## 2012-05-02 LAB — GLUCOSE, CAPILLARY
Glucose-Capillary: 118 mg/dL — ABNORMAL HIGH (ref 70–99)
Glucose-Capillary: 120 mg/dL — ABNORMAL HIGH (ref 70–99)
Glucose-Capillary: 123 mg/dL — ABNORMAL HIGH (ref 70–99)
Glucose-Capillary: 124 mg/dL — ABNORMAL HIGH (ref 70–99)
Glucose-Capillary: 133 mg/dL — ABNORMAL HIGH (ref 70–99)
Glucose-Capillary: 94 mg/dL (ref 70–99)

## 2012-05-02 MED ORDER — IOHEXOL 300 MG/ML  SOLN: 50.0000 mL | Freq: Once | INTRAMUSCULAR | Status: AC | PRN

## 2012-05-02 MED ORDER — PANTOPRAZOLE SODIUM 40 MG IV SOLR
40.0000 mg | INTRAVENOUS | Status: DC
  Administered 2012-05-02: 40 mg via INTRAVENOUS
  Filled 2012-05-02 (×2): qty 40

## 2012-05-02 MED ORDER — POTASSIUM CHLORIDE 10 MEQ/100ML IV SOLN
10.0000 meq | INTRAVENOUS | Status: AC
  Administered 2012-05-02 (×3): 10 meq via INTRAVENOUS
  Filled 2012-05-02 (×3): qty 100

## 2012-05-02 NOTE — Progress Notes (Signed)
1 Day Post-Op  Subjective: Had UGI.  sats much better.  She was sedated last night but breathing comfortably.  Objective: Vital signs in last 24 hours: Temp:  [97.4 F (36.3 C)-99.5 F (37.5 C)] 99.5 F (37.5 C) (11/06 1000) Pulse Rate:  [78-120] 78  (11/06 1000) Resp:  [13-20] 18  (11/06 1000) BP: (132-163)/(84-94) 154/84 mmHg (11/06 1000) SpO2:  [85 %-100 %] 98 % (11/06 1000) Weight:  [198 lb (89.812 kg)] 198 lb (89.812 kg) (11/05 1613) Last BM Date: 04/30/12  Intake/Output from previous day: 11/05 0701 - 11/06 0700 In: 4464.6 [I.V.:4464.6] Out: 3450 [Urine:3100; Drains:350] Intake/Output this shift: Total I/O In: 325 [I.V.:125; IV Piggyback:200] Out: 1170 [Urine:1150; Drains:20]  General appearance: alert, cooperative and no distress Resp: nonlabored, sats 98%2l Cardio: normal rate, regular GI: soft, appropriate incisional tenderness, ND, wounds without infection, JP ss Extremities: SCD's bilat  Lab Results:   [facility] 05/02/12 0525  WBC 21.1*  HGB 13.1  HCT 37.8  PLT 399   BMET  Basename 05/02/12 0525  NA 136  K 3.4*  CL 101  CO2 26  GLUCOSE 158*  BUN 4*  CREATININE 0.68  CALCIUM 9.2   PT/INR No results found for this basename: LABPROT:2,INR:2 in the last 72 hours ABG No results found for this basename: PHART:2,PCO2:2,PO2:2,HCO3:2 in the last 72 hours  Studies/Results: Dg Ugi W/water Sol Cm  05/02/2012  *RADIOLOGY REPORT*  Clinical Data:  Postop day #1 Roux-en-Y gastric bypass  UPPER GI SERIES WITH KUB  Technique:  Routine upper GI series was performed with 50 ml water soluble contrast  Fluoroscopy Time: 1.2 minutes  Comparison:  08/16/2011  Findings: Pre-procedure KUB demonstrates distended loops of small bowel up to 4.2 cm.  A drainage catheter projects over the left upper quadrant.  Air is noted within normal caliber colon. Cholecystectomy clips No acute osseous finding.  Following ingestion of water soluble contrast, contrast readily passes the  gastroesophageal junction into the gastric pouch.  No communication with the excluded stomach or intraperitoneal extravasation is demonstrated. After a short delay, contrast passes into proximal jejunal loops.  There is a small amount of retrograde filling of the afferent limb.  Contrast continues into more distal jejunal loops.  After waiting 10 minutes, there has been minimal progression.  I cannot say with certainty that contrast has passed the jejunojejunal anastomoses.  IMPRESSION: Status post Roux-en-Y gastric bypass.  No extravasation or communication with the excluded stomach.  Delayed transit into distal jejunal loops is favored to be secondary to an ileus given distended small bowel loops. I cannot say with certainty that contrast has passed the jejunojejunal anastomoses. A 1-2 hr KUB follow-up can be obtained.   Original Report Authenticated By: Jearld Lesch, M.D.     Anti-infectives: Anti-infectives     Start     Dose/Rate Route Frequency Ordered Stop   05/01/12 0808   ciprofloxacin (CIPRO) IVPB 400 mg        400 mg 200 mL/hr over 60 Minutes Intravenous On call to O.R. 05/01/12 0808 05/01/12 1048          Assessment/Plan: s/p Procedure(s) (LRB) with comments: LAPAROSCOPIC ROUX-EN-Y GASTRIC BYPASS WITH UPPER ENDOSCOPY (N/A) she looks okay.  pain controlled. swallow looks okay to me but official results still pending. Will advance to liquids today   LOS: 1 day    Melody Johnson 05/02/2012

## 2012-05-02 NOTE — Care Management Note (Signed)
    Page 1 of 1   05/02/2012     10:30:55 AM   CARE MANAGEMENT NOTE 05/02/2012  Patient:  Melody Johnson,Melody Johnson   Account Number:  [id number]  Date Initiated:  05/02/2012  Documentation initiated by:  Lorenda Ishihara  Subjective/Objective Assessment:   46 yo female admitted Johnson/p lap gastric bypass. PTA lived at home with spouse.     Action/Plan:   Home when stable   Anticipated DC Date:  05/03/2012   Anticipated DC Plan:  HOME/SELF CARE      DC Planning Services  CM consult      Choice offered to / List presented to:             Status of service:  Completed, signed off Medicare Important Message given?  NA - LOS <3 / Initial given by admissions (If response is "NO", the following Medicare IM given date fields will be blank) Date Medicare IM given:   Date Additional Medicare IM given:    Discharge Disposition:  HOME/SELF CARE  Per UR Regulation:  Reviewed for med. necessity/level of care/duration of stay  If discussed at Long Length of Stay Meetings, dates discussed:    Comments:

## 2012-05-02 NOTE — Progress Notes (Signed)
Pt alert and oriented; VSS; denies any nausea or vomiting this am; tolerating water well; burping; denies flatus; denies; BM; voiding without difficulty; ambulating in hallways without difficulty; using incentive spirometer as directed; c/o some abdominal soreness with relief from prn meds; pt already has follow up appts with [facility] and [facility]; aware of support group and BELT program; discharge instructions given for pt to review; questions answered; continue current plan of care. GASTRIC BYPASS/SLEEVE DISCHARGE INSTRUCTIONS  Drs. Fredrik Rigger, Hoxworth, Wilson, and Bacliff Call if you have any problems.   Call [phone] and ask for the surgeon on call.    If you need immediate assistance come to the ER at [facility]. Tell the ER personnel that you are a new post-op gastric bypass patient. Signs and symptoms to report:   Severe vomiting or nausea. If you cannot tolerate clear liquids for longer than 1 day, you need to call your surgeon.    Abdominal pain which does not get better after taking your pain medication   Fever greater than 101 F degree   Difficulty breathing   Chest pain    Redness, swelling, drainage, or foul odor at incision sites    If your incisions open or pull apart   Swelling or pain in calf (lower leg)   Diarrhea, frequent watery, uncontrolled bowel movements.   Constipation, (no bowel movements for 3 days) if this occurs, Take Milk of Magnesia, 2 tablespoons by mouth, 3 times a day for 2 days if needed.  Call your doctor if constipation continues. Stop taking Milk of Magnesia once you have had a bowel movement. You may also use Miralax according to the label instructions.   Anything you consider "abnormal for you".   Normal side effects after Surgery:   Unable to sleep at night or concentrate   Irritability   Being tearful (crying) or depressed   These are common complaints, possibly related to your anesthesia, stress of surgery and change in lifestyle, that usually go  away a few weeks after surgery.  If these feelings continue, call your medical doctor.  Wound Care You may have surgical glue, steri-strips, or staples over your incisions after surgery.  Surgical glue:  Looks like a clear film over your incisions and will wear off gradually. Steri-strips: Strips of tape over your incisions. You may notice a yellowish color on the skin underneath the steri-strips. This is a substance used to make the steri-strips stick better. Do not pull the steri-strips off - let them fall off.  Staples: Cherlynn Polo may be removed before you leave the hospital. If you go home with staples, call [facility] [phone]) for an appointment with your surgeon's nurse to have staples removed in 7 - 10 days. Showering: You may shower two days after your surgery unless otherwise instructed by your surgeon. Wash gently around wounds with warm soapy water, rinse well, and gently pat dry.  If you have a drain, you may need someone to hold this while you shower. Avoid tub baths until staples are removed and incisions are healed.    Medications   Medications should be liquid or crushed if larger than the size of a dime.  Extended release pills should not be crushed.   Depending on the size and number of medications you take, you may need to stagger/change the time you take your medications so that you do not over-fill your pouch.    Make sure you follow-up with your primary care physician to make medication adjustments  needed during rapid weight loss and life-style adjustment.   If you are diabetic, follow up with the doctor that prescribes your diabetes medication(s) within one week after surgery and check your blood sugar regularly.   Do not drive while taking narcotics!   Do not take acetaminophen (Tylenol) and Roxicet or Lortab Elixir at the same time since these pain medications contain acetaminophen.  Diet at home: (First 2 Weeks) You will see the nutritionist two weeks  after your surgery. She will advance your diet if you are tolerating liquids well. Once at home, if you have severe vomiting or nausea and cannot tolerate clear liquids lasting longer than 1 day, call your surgeon.  Begin high protein shake 2 ounces every 3 hours, 5 - 6 times per day.  Gradually increase the amount you drink as tolerated.  You may find it easier to slowly sip shakes throughout the day.  It is important to get your proteins in first.   Protein Shake   Drink at least 2 ounces of shake 5-6 times per day   Each serving of protein shakes should have a minimum of 15 grams of protein and no more than 5 grams of carbohydrate    Increase the amount of protein shake you drink as tolerated   Protein powder may be added to fluids such as non-fat milk or Lactaid milk (limit to 20 grams added protein powder per serving   The initial goal is to drink at least 8 ounces of protein shake/drink per day (or as directed by the nutritionist). Some examples of protein shakes are [employer], [employer], EAS Edge HP, and Unjury. Hydration   Gradually increase the amount of water and other liquids as tolerated (See Acceptable Fluids)   Gradually increase the amount of protein shake as tolerated     Sip fluids slowly and throughout the day   May use Sugar substitutes, use sparingly (limit to 6 - 8 packets per day). Your fluid goal is 64 ounces of fluid daily. It may take a few weeks to build up to this.         32 oz (or more) should be clear liquids and 32 oz (or more) should be full liquids.         Liquids should not contain sugar, caffeine, or carbonation! Acceptable Fluids Clear Liquids:   Water or Sugar-free flavored water, Fruit H2O   Decaffeinated coffee or tea (sugar-free)   Crystal Lite, Wyler's Lite, Minute Maid Lite   Sugar-free Jell-O   Bouillon or broth   Sugar-free Popsicle:   *Less than 20 calories each; Limit 1 per day   Full Liquids:              Protein Shakes/Drinks + 2  choices per day of other full liquids shown below.    Other full liquids must be: No more than 12 grams of Carbs per serving,  No more than 3 grams of Fat per serving   Strained low-fat cream soup   Non-Fat milk   Fat-free Lactaid Milk   Sugar-free yogurt (Dannon Lite & Fit) Vitamins and Minerals (Start 1 day after surgery unless otherwise directed)   2 Chewable Multivitamin / Multimineral Supplement (i.e. Centrum for Adults)   Chewable Calcium Citrate with Vitamin D-3. Take 1500 mg each day.           (Example: 3 Chewable Calcium Plus 600 with Vitamin D-3 can be found at [facility])  Vitamin B-12, 350 - 500 micrograms (oral tablet) each day   Do not mix multivitamins containing iron with calcium supplements; take 2 hours   apart   Do not substitute Tums (calcium carbonate) for your calcium   Menstruating women and those at risk for anemia may need extra iron. Talk with your doctor to see if you need additional iron.    If you need extra iron:  Total daily Iron recommendations (including Vitamins) = 50 - 100 mg Iron/day Do not stop taking or change any vitamins or minerals until you talk to your nutritionist or surgeon. Your nutritionist and / or physician must approve all vitamin and mineral supplements. Exercise For maximum success, begin exercising as soon as your doctor recommends. Make sure your physician approves any physical activity.   Depending on fitness level, begin with a simple walking program   Walk 5-15 minutes each day, 7 days per week.    Slowly increase until you are walking 30-45 minutes per day   Consider joining our BELT program. [phone] or email [email or web address] [email or web address] Things to remember:    You may have sexual relations when you feel comfortable. It is VERY important for female patients to use a reliable birth control method. Fertility often increases after surgery. Do not get pregnant for at least 18 months.   It is very important to keep all follow up appointments  with your surgeon, nutritionist, primary care physician, and behavioral health practitioner. After the first year, please follow up with your bariatric surgeon at least once a year in order to maintain best weight loss results.  [facility]: [phone] [facility]: [phone]   Free counseling is available for you and your family through collaboration between [facility] and [place]. Please call [phone] and leave a message.    Consider purchasing a medical alert bracelet that says you had gastric bypass surgery.    [facility] has a free Bariatric Surgery Support Group that meets monthly, the 3rd Thursday, 6 pm, Classroom #1, [place]. You may register online at [email or web address], but registration is not necessary. Select Classes and Support Groups, Bariatric Surgery, or Call [phone]   Do not return to work or drive until cleared by your surgeon   Use your CPAP when sleeping if applicable   Do not lift anything greater than ten pounds for at least two weeks  Joen Laura, RN BAriatric Nurse Coordinator

## 2012-05-02 NOTE — Op Note (Signed)
NAMEJADYNN, EPPING NO.:  [id number]  MEDICAL RECORD NO.:  [id number]  LOCATION:  1531                         FACILITY:  [facility]  PHYSICIAN:  Lodema Pilot, MD       DATE OF BIRTH:  June 05, 1966  DATE OF PROCEDURE:  05/01/2012 DATE OF DISCHARGE:                              OPERATIVE REPORT   PROCEDURE:  Laparoscopic Roux-en-Y gastric bypass with intraoperative endoscopy.  PREOPERATIVE DIAGNOSIS:  Morbid obesity.  POSTOPERATIVE DIAGNOSIS:  Morbid obesity.  SURGEON:  Lodema Pilot, MD  ASSISTANT:  Thornton Park. Daphine Deutscher, MD  ANESTHESIA:  General endotracheal anesthesia with 39 mL of 1% lidocaine with epinephrine and 0.25% Marcaine in 50:50 mixture.  FLUIDS:  2300 mL of crystalloid.  ESTIMATED BLOOD LOSS:  Less than 100 mL.  DRAINS:  A 19-French Blake drain left near the gastrojejunal anastomosis.  SPECIMEN:  None.  COMPLICATIONS:  None apparent.  FINDINGS:  Anticolic, antigastric Roux-en-Y gastric bypass with a 21 mm circular stapler at 100 cm Roux limb.  The mesenteric defect was closed.  INDICATION FOR PROCEDURE:  Ms. Gubler is a 46 year old female with BMI of 35 and hypertension, diabetes mellitus, sleep apnea, who has failed medical weight loss attempts and desires durable weight loss solutions.  OPERATIVE DETAILS:  Ms. Spearing was seen and evaluated in the preoperative area and risks and benefits of the procedure were discussed in lay terms.  Informed consent was obtained.  Prophylactic antibiotics were given and DVT prophylaxis was also administered. She was taken to the operating room, placed on table in supine position.  General endotracheal tube anesthesia was obtained.  Foley catheter was placed and her abdomen was prepped and draped in a standard surgical fashion. Procedure time-out was performed with all operative team members to confirm proper patient and procedure and a 5-mm Optiview trocar was used to access the peritoneum in the left  upper quadrant.  Pneumoperitoneum was obtained and the laparoscope was introduced and there was no evidence of bleeding or bowel injury.  A 5 mm left rectus port was placed and a 5 mm right upper quadrant port was placed and a 15-mm right rectus port was also placed under direct visualization.  I rolled the omentum above the liver and elevated the transverse colon.  For some reason, she has adhesions to the transverse colon mesentery and these were taken down using sharp dissection and I was able to identify ligament of Treitz.  I measured out 50 cm from ligament of Treitz. Prior to dividing this, we elevated this up to the stomach and this appeared to reach without tension.  I divided the small intestine, which was 60 mm white load Endo-GIA stapler.  I tacked the Roux limb with an endo stitch.  I divided the mesentery down towards the base using the Harmonic scalpel and measured out 100 cm on the Roux limb.  Two small bowel enterotomies were made and a side-to-side, functional end-to-end stapled anastomosis was created with a 60 mm white load on the anterior mesenteric surface of the small intestine.  Three sutures were placed at the common enterotomy.  These were elevated and I closed the common enterotomy with transverse firing of a blue 60  mm staple load and the staple line was inspected and this appeared to close the enterotomies. An alignment stitch was placed as well as a crotch stitch to take tension off the anastomosis.  Then, the mesenteric defect was closed, starting from the base and 2-0 Ethibond suture was used to close the defect running this towards the alignment stitch and the suture was secured.  The greater omentum was divided and a Nathanson liver retractor was placed through the epigastric stab incision to retract the left lobe of the liver.  The Roux limb was placed up over the colon and this stayed without sutures and I opened up the pars flaccida and measured out 5  cm from the GE junction.  The fat and lesser curve vasculature was divided with a white load covered with Steri-Strips up to the wall of the stomach.  All tubes were removed from the mouth and stomach except for the endotracheal tube and I fired a gold load 50 mm transversely across the lesser curvature of the stomach 5 cm from the GE junction.  Then retrogastric tunnel was created up to the angle of His and I took down the angle of His.  Lateral wall of the gastric pouch was created.  Two firings of the 60 mm gold staple covered with Steri- Strips.  The stomach was completely transected from the remnant stomach. I opened up the hiatus.  She had a small dimple anteriorly but not any sizable hiatal hernia, although she did have hiatal hernia noted on upper GI preoperatively, so I opened up the right and left crus to ensure there was not any significant amount of stomach up in the hiatus and the right and left diaphragm were approximated with figure-of-eight 2-0 Ethibond sutures.  Then a small gastrotomy was made on the uncovered portion of the staple line of the pouch and the distal pouch and the orville anvil was passed through the mouth and gastrotomy suture was removed, tubing was removed, and I changed my gloves after I withdrew the tubing.  Then 2-0 Ethibond suture was used to tighten the stomach around the anvil and the Roux limb was brought up.  We inspected the Roux limb mesentery to make sure that we were not twisting the Roux limb and then a small bowel enterotomy was made and I enlarged the 15-mm port site and the fascia to accommodate the 21 mm EEA stapler.  This was passed through the Roux limb and the spike was exited on the antimesenteric surface of the small bowel and this was mated with the anvil.  A 21 mm, 3.5 mm circular stapler was used to create staple in the gastrojejunal anastomosis and the donuts were intact in the stapler. The mesentery of the extra Roux limb  candy-cane was undermined with a Harmonic scalpel and Roux limb was amputated close to flush with the stomach and anastomosis to close the enterotomy with resected portion of the small intestine placed in EndoCatch bag and removed, although not sent to Pathology.  There was a little bit of bleeding on the staple line and I touched this with the Bovie and this controlled the bleeding. Sutures were placed on each side of the anastomosis of the stomach to the Roux limb to minimize tension on the staple line.  The Roux limb was clamped with bowel clips and the abdomen was filled with saline solution and Dr. Daphine Deutscher performed upper endoscopy.  He passed the camera down into the pouch and visualized the internal pouch  which appeared to be a nice tubular pouch without any evidence of internal bleeding.  The anastomosis was patent and was able to drive the scope into the Roux limb.  There was no evidence of air leakage.  The air was suctioned and the scope was removed and I suctioned the remainder of the fluid from the abdomen and inspected my jejunal anastomosis, which was hemostatic and there was no evidence of bile leakage.  The upper abdomen appeared hemostatic and I placed a 19-French Blake drain into the abdomen and placed this just posterior to the gastrojejunal anastomosis.  This was exited through the left upper quadrant trocar site and sutured in place with a 2-0 nylon drain stitch.  The liver retractor was removed under direct visualization and the 15 mm port site was approximated in open fashion with interrupted 0 Vicryl sutures.  The sutures were secured and the abdomen was re-insufflated with carbon dioxide gas.  Closure was noted to be adequate without any evidence of bowel injury or bleeding. The final trocars removed under direct visualization and the abdomen was noted to be hemostatic.  There was no evidence of bleeding or bowel injury.  The remainder of the gas removed and the  final trocars removed. The skin incisions were injected with total of 39 mL of 1% lidocaine with epinephrine and 0.25% Marcaine in a 50:50 mixture.  Skin edges were approximated with 4-0 Monocryl subcuticular suture.  Skin was washed and dried and Dermabond was applied.  All sponge, needle, and instrument counts correct at end of the case.  The patient tolerated the procedure well without apparent complication.  Foley was removed in the operating room.          ______________________________ Lodema Pilot, MD     BL/MEDQ  D:  05/01/2012  T:  05/02/2012  Job:  [id number]

## 2012-05-02 NOTE — Progress Notes (Signed)
Feels well.  Tolerating water.  sats good on RA. Mobilize and increase liquids as tolerated.  Should be okay for discharge to home tomorrow.

## 2012-05-03 LAB — BASIC METABOLIC PANEL
CO2: 23 mEq/L (ref 19–32)
Chloride: 102 mEq/L (ref 96–112)
Creatinine, Ser: 0.57 mg/dL (ref 0.50–1.10)
GFR calc Af Amer: >90 mL/min (ref >90)
Potassium: 3.5 mEq/L (ref 3.5–5.1)
Sodium: 136 mEq/L (ref 135–145)

## 2012-05-03 LAB — GLUCOSE, CAPILLARY: Glucose-Capillary: 95 mg/dL (ref 70–99)

## 2012-05-03 LAB — CBC WITH DIFFERENTIAL/PLATELET
Eosinophils Absolute: 0.1 10*3/uL (ref 0.0–0.7)
Hemoglobin: 12.4 g/dL (ref 12.0–15.0)
Lymphs Abs: 2 10*3/uL (ref 0.7–4.0)
Monocytes Relative: 9 % (ref 3–12)
Neutro Abs: 12.8 10*3/uL — ABNORMAL HIGH (ref 1.7–7.7)
Neutrophils Relative %: 78 % — ABNORMAL HIGH (ref 43–77)
Platelets: 337 10*3/uL (ref 150–400)
RBC: 3.79 MIL/uL — ABNORMAL LOW (ref 3.87–5.11)
WBC: 16.4 10*3/uL — ABNORMAL HIGH (ref 4.0–10.5)

## 2012-05-03 MED ORDER — ONDANSETRON 4 MG PO TBDP: 4.0000 mg | ORAL_TABLET | Freq: Three times a day (TID) | ORAL | Status: DC | PRN

## 2012-05-03 MED ORDER — OXYCODONE-ACETAMINOPHEN 5-325 MG/5ML PO SOLN: 5.0000 mL | ORAL | Status: DC | PRN

## 2012-05-03 NOTE — Discharge Summary (Signed)
Physician Discharge Summary  Patient ID: Melody Johnson MRN: [id number] DOB/AGE: 02-03-1966 46 y.o.  Admit date: 05/01/2012 Discharge date: 05/03/2012  Admission Diagnoses: morbid obesity  Discharge Diagnoses: same Active Problems:  * No active hospital problems. *    Discharged Condition: stable  Hospital Course: to OR 05/01/12 for lap roux Y gastric bypass/EGD.  No apparent complications.  UGI on POD 1 okay.  Her diet was advanced and ambulatory.  Pain controlled and stable for discharge to home on POD 2  Consults: None  Significant Diagnostic Studies: UGI  Treatments: surgery: RYGB 05/01/12  Disposition: 01-Home or Self Care  Discharge Orders    Future Appointments: Provider: Department: Dept Phone: Center:   05/15/2012 4:00 PM [facility] Post-Op Class [facility] [phone] NDM   05/16/2012 1:15 PM Lodema Pilot, DO [facility] [place] [phone] None     Future Orders Please Complete By Expires   Increase activity slowly      Discharge instructions      Comments:   Call [phone] for follow up appointment in 3 weeks with Dr. Biagio Quint May shower tomorrow. Can change gauze over drain site with clean gauze as needed. Increase ambulation as tolerated. Follow bariatric postop diet.   Call MD for:  temperature >100.4      Call MD for:  persistant nausea and vomiting      Call MD for:  severe uncontrolled pain      Call MD for:  redness, tenderness, or signs of infection (pain, swelling, redness, odor or green/yellow discharge around incision site)      Call MD for:  difficulty breathing, headache or visual disturbances          Medication List     As of 05/03/2012  7:53 AM    STOP taking these medications         aspirin EC 81 MG tablet      metFORMIN 500 MG tablet   Commonly known as: GLUCOPHAGE      TAKE these medications         cetirizine 10 MG tablet   Commonly known as: ZYRTEC   Take 10 mg by mouth  every morning.      CITRACAL PETITES/VITAMIN D 200-250 MG-UNIT Tabs   Generic drug: Calcium Citrate-Vitamin D   Take 2 tablets by mouth 2 (two) times daily.      cycloSPORINE 0.05 % ophthalmic emulsion   Commonly known as: RESTASIS   Place 1 drop into both eyes 2 (two) times daily.      fish oil-omega-3 fatty acids 1000 MG capsule   Take 2 g by mouth daily.      fluvoxaMINE 100 MG tablet   Commonly known as: LUVOX   Take 100 mg by mouth every morning.      hydroxypropyl methylcellulose 2.5 % ophthalmic solution   Commonly known as: ISOPTO TEARS   Place 1 drop into both eyes as needed. Dry eyes      mirtazapine 15 MG tablet   Commonly known as: REMERON   Take 15 mg by mouth at bedtime.      omeprazole 20 MG capsule   Commonly known as: PRILOSEC   Take 20 mg by mouth every morning.      ondansetron 4 MG disintegrating tablet   Commonly known as: ZOFRAN-ODT   Take 1 tablet (4 mg total) by mouth every 8 (eight) hours as needed for nausea.      ONE-A-DAY WOMENS PO  Take 1 tablet by mouth daily.      oxyCODONE-acetaminophen 5-325 MG/5ML solution   Commonly known as: ROXICET   Take 5-7.5 mLs by mouth every 4 (four) hours as needed.      penciclovir 1 % cream   Commonly known as: DENAVIR   Apply 1 application topically as needed. Only for outbreaks      ramipril 10 MG capsule   Commonly known as: ALTACE   Take 10 mg by mouth daily before breakfast.      simvastatin 40 MG tablet   Commonly known as: ZOCOR   Take 40 mg by mouth at bedtime.      valACYclovir 500 MG tablet   Commonly known as: VALTREX   Take 500 mg by mouth every morning.      VITAMIN B 12 PO   Take 500 mg by mouth daily.         SignedLodema Pilot DAVID 05/03/2012, 7:53 AM

## 2012-05-03 NOTE — Progress Notes (Signed)
Pt alert and oriented; VSS; tolerating water well; will advance to protein shakes; burping; +flatus; denies BM; voiding without difficulty; ambulating in hallways without difficulty; using incentive spirometer as directed; c/o some abdominal soreness with relief from prn meds; pt already has follow up appts with [facility] and [facility]; aware of support group and BELT program; discharge instructions reviewed and pt verbalized understanding of.  GASTRIC BYPASS/SLEEVE DISCHARGE INSTRUCTIONS  Drs. Fredrik Rigger, Hoxworth, Wilson, and [place] Call if you have any problems.   Call [phone] and ask for the surgeon on call.    If you need immediate assistance come to the ER at [facility]. Tell the ER personnel that you are a new post-op gastric bypass patient. Signs and symptoms to report:   Severe vomiting or nausea. If you cannot tolerate clear liquids for longer than 1 day, you need to call your surgeon.    Abdominal pain which does not get better after taking your pain medication   Fever greater than 101 F degree   Difficulty breathing   Chest pain    Redness, swelling, drainage, or foul odor at incision sites    If your incisions open or pull apart   Swelling or pain in calf (lower leg)   Diarrhea, frequent watery, uncontrolled bowel movements.   Constipation, (no bowel movements for 3 days) if this occurs, Take Milk of Magnesia, 2 tablespoons by mouth, 3 times a day for 2 days if needed.  Call your doctor if constipation continues. Stop taking Milk of Magnesia once you have had a bowel movement. You may also use Miralax according to the label instructions.   Anything you consider "abnormal for you".   Normal side effects after Surgery:   Unable to sleep at night or concentrate   Irritability   Being tearful (crying) or depressed   These are common complaints, possibly related to your anesthesia, stress of surgery and change in lifestyle, that usually go away a few weeks after surgery.  If these  feelings continue, call your medical doctor.  Wound Care You may have surgical glue, steri-strips, or staples over your incisions after surgery.  Surgical glue:  Looks like a clear film over your incisions and will wear off gradually. Steri-strips: Strips of tape over your incisions. You may notice a yellowish color on the skin underneath the steri-strips. This is a substance used to make the steri-strips stick better. Do not pull the steri-strips off - let them fall off.  Staples: Cherlynn Polo may be removed before you leave the hospital. If you go home with staples, call [facility] [phone]) for an appointment with your surgeon's nurse to have staples removed in 7 - 10 days. Showering: You may shower two days after your surgery unless otherwise instructed by your surgeon. Wash gently around wounds with warm soapy water, rinse well, and gently pat dry.  If you have a drain, you may need someone to hold this while you shower. Avoid tub baths until staples are removed and incisions are healed.    Medications   Medications should be liquid or crushed if larger than the size of a dime.  Extended release pills should not be crushed.   Depending on the size and number of medications you take, you may need to stagger/change the time you take your medications so that you do not over-fill your pouch.    Make sure you follow-up with your primary care physician to make medication adjustments needed during rapid weight loss and life-style adjustment.  If you are diabetic, follow up with the doctor that prescribes your diabetes medication(s) within one week after surgery and check your blood sugar regularly.   Do not drive while taking narcotics!   Do not take acetaminophen (Tylenol) and Roxicet or Lortab Elixir at the same time since these pain medications contain acetaminophen.  Diet at home: (First 2 Weeks) You will see the nutritionist two weeks after your surgery. She will advance your  diet if you are tolerating liquids well. Once at home, if you have severe vomiting or nausea and cannot tolerate clear liquids lasting longer than 1 day, call your surgeon.  Begin high protein shake 2 ounces every 3 hours, 5 - 6 times per day.  Gradually increase the amount you drink as tolerated.  You may find it easier to slowly sip shakes throughout the day.  It is important to get your proteins in first.   Protein Shake   Drink at least 2 ounces of shake 5-6 times per day   Each serving of protein shakes should have a minimum of 15 grams of protein and no more than 5 grams of carbohydrate    Increase the amount of protein shake you drink as tolerated   Protein powder may be added to fluids such as non-fat milk or Lactaid milk (limit to 20 grams added protein powder per serving   The initial goal is to drink at least 8 ounces of protein shake/drink per day (or as directed by the nutritionist). Some examples of protein shakes are [employer], Dillard's, EAS Edge HP, and Unjury. Hydration   Gradually increase the amount of water and other liquids as tolerated (See Acceptable Fluids)   Gradually increase the amount of protein shake as tolerated     Sip fluids slowly and throughout the day   May use Sugar substitutes, use sparingly (limit to 6 - 8 packets per day). Your fluid goal is 64 ounces of fluid daily. It may take a few weeks to build up to this.         32 oz (or more) should be clear liquids and 32 oz (or more) should be full liquids.         Liquids should not contain sugar, caffeine, or carbonation! Acceptable Fluids Clear Liquids:   Water or Sugar-free flavored water, Fruit H2O   Decaffeinated coffee or tea (sugar-free)   Crystal Lite, Wyler's Lite, Minute Maid Lite   Sugar-free Jell-O   Bouillon or broth   Sugar-free Popsicle:   *Less than 20 calories each; Limit 1 per day   Full Liquids:              Protein Shakes/Drinks + 2 choices per day of other full liquids  shown below.    Other full liquids must be: No more than 12 grams of Carbs per serving,  No more than 3 grams of Fat per serving   Strained low-fat cream soup   Non-Fat milk   Fat-free Lactaid Milk   Sugar-free yogurt (Dannon Lite & Fit) Vitamins and Minerals (Start 1 day after surgery unless otherwise directed)   2 Chewable Multivitamin / Multimineral Supplement (i.e. Centrum for Adults)   Chewable Calcium Citrate with Vitamin D-3. Take 1500 mg each day.           (Example: 3 Chewable Calcium Plus 600 with Vitamin D-3 can be found at [facility])         Vitamin B-12, 350 - 500 micrograms (oral tablet) each day  Do not mix multivitamins containing iron with calcium supplements; take 2 hours   apart   Do not substitute Tums (calcium carbonate) for your calcium   Menstruating women and those at risk for anemia may need extra iron. Talk with your doctor to see if you need additional iron.    If you need extra iron:  Total daily Iron recommendations (including Vitamins) = 50 - 100 mg Iron/day Do not stop taking or change any vitamins or minerals until you talk to your nutritionist or surgeon. Your nutritionist and / or physician must approve all vitamin and mineral supplements. Exercise For maximum success, begin exercising as soon as your doctor recommends. Make sure your physician approves any physical activity.   Depending on fitness level, begin with a simple walking program   Walk 5-15 minutes each day, 7 days per week.    Slowly increase until you are walking 30-45 minutes per day   Consider joining our BELT program. [phone] or email [email or web address] .edu Things to remember:    You may have sexual relations when you feel comfortable. It is VERY important for female patients to use a reliable birth control method. Fertility often increases after surgery. Do not get pregnant for at least 18 months.   It is very important to keep all follow up appointments with your surgeon, nutritionist, primary  care physician, and behavioral health practitioner. After the first year, please follow up with your bariatric surgeon at least once a year in order to maintain best weight loss results.  [facility]: [phone] [facility]: [phone]   Free counseling is available for you and your family through collaboration between [facility] and [facility]. Please call [phone] and leave a message.    Consider purchasing a medical alert bracelet that says you had gastric bypass surgery.    The [facility] has a free Bariatric Surgery Support Group that meets monthly, the 3rd Thursday, 6 pm, Classroom #1, [place]. You may register online at [email or web address], but registration is not necessary. Select Classes and Support Groups, Bariatric Surgery, or Call [phone]   Do not return to work or drive until cleared by your surgeon   Use your CPAP when sleeping if applicable   Do not lift anything greater than ten pounds for at least two weeks  Joen Laura, RN Bariatric Nurse coordinator

## 2012-05-03 NOTE — Progress Notes (Signed)
2 Days Post-Op  Subjective: Doing well. Minimal discomfort.  Tolerating liquids.  sats good on RA  Objective: Vital signs in last 24 hours: Temp:  [98.3 F (36.8 C)-99.5 F (37.5 C)] 98.3 F (36.8 C) (11/07 0610) Pulse Rate:  [78-105] 87  (11/07 0610) Resp:  [18] 18  (11/07 0610) BP: (131-154)/(79-88) 143/88 mmHg (11/07 0610) SpO2:  [95 %-98 %] 98 % (11/07 0610) Last BM Date: 04/30/12  Intake/Output from previous day: 11/06 0701 - 11/07 0700 In: 2710 [P.O.:180; I.V.:2220; IV Piggyback:310] Out: 3310 [Urine:3100; Drains:210] Intake/Output this shift:    General appearance: alert, cooperative and no distress Resp: clear to auscultation bilaterally Cardio: HR 79, regular GI: soft, minimal incisional tenderness, ND, no peritoneal signs. JP appropriate serous output  Lab Results:   Basename 05/03/12 0429 05/02/12 0525  WBC 16.4* 21.1*  HGB 12.4 13.1  HCT 35.7* 37.8  PLT 337 399   BMET  Basename 05/03/12 0429 05/02/12 0525  NA 136 136  K 3.5 3.4*  CL 102 101  CO2 23 26  GLUCOSE 141* 158*  BUN 4* 4*  CREATININE 0.57 0.68  CALCIUM 8.7 9.2   PT/INR No results found for this basename: LABPROT:2,INR:2 in the last 72 hours ABG No results found for this basename: PHART:2,PCO2:2,PO2:2,HCO3:2 in the last 72 hours  Studies/Results: Dg Ugi W/water Sol Cm  05/02/2012  *RADIOLOGY REPORT*  Clinical Data:  Postop day #1 Roux-en-Y gastric bypass  UPPER GI SERIES WITH KUB  Technique:  Routine upper GI series was performed with 50 ml water soluble contrast  Fluoroscopy Time: 1.2 minutes  Comparison:  08/16/2011  Findings: Pre-procedure KUB demonstrates distended loops of small bowel up to 4.2 cm.  A drainage catheter projects over the left upper quadrant.  Air is noted within normal caliber colon. Cholecystectomy clips No acute osseous finding.  Following ingestion of water soluble contrast, contrast readily passes the gastroesophageal junction into the gastric pouch.  No communication  with the excluded stomach or intraperitoneal extravasation is demonstrated. After a short delay, contrast passes into proximal jejunal loops.  There is a small amount of retrograde filling of the afferent limb.  Contrast continues into more distal jejunal loops.  After waiting 10 minutes, there has been minimal progression.  I cannot say with certainty that contrast has passed the jejunojejunal anastomoses.  IMPRESSION: Status post Roux-en-Y gastric bypass.  No extravasation or communication with the excluded stomach.  Delayed transit into distal jejunal loops is favored to be secondary to an ileus given distended small bowel loops. I cannot say with certainty that contrast has passed the jejunojejunal anastomoses. A 1-2 hr KUB follow-up can be obtained.   Original Report Authenticated By: Jearld Lesch, M.D.     Anti-infectives: Anti-infectives     Start     Dose/Rate Route Frequency Ordered Stop   05/01/12 0808   ciprofloxacin (CIPRO) IVPB 400 mg        400 mg 200 mL/hr over 60 Minutes Intravenous On call to O.R. 05/01/12 0808 05/01/12 1048          Assessment/Plan: s/p Procedure(s) (LRB) with comments: LAPAROSCOPIC ROUX-EN-Y GASTRIC BYPASS WITH UPPER ENDOSCOPY (N/A) she continues to feel better, tolerating liquids.  should be okay for discharge to home today  LOS: 2 days    Lodema Pilot DAVID 05/03/2012

## 2012-05-15 ENCOUNTER — Encounter: Payer: Self-pay | Admitting: *Deleted

## 2012-05-15 ENCOUNTER — Encounter: Payer: 59 | Attending: General Surgery | Admitting: *Deleted

## 2012-05-15 VITALS — Ht 63.0 in | Wt 184.1 lb

## 2012-05-15 DIAGNOSIS — Z713 Dietary counseling and surveillance: Secondary | ICD-10-CM | POA: Insufficient documentation

## 2012-05-15 DIAGNOSIS — Z01818 Encounter for other preprocedural examination: Secondary | ICD-10-CM | POA: Insufficient documentation

## 2012-05-15 DIAGNOSIS — E669 Obesity, unspecified: Secondary | ICD-10-CM

## 2012-05-15 NOTE — Progress Notes (Addendum)
Bariatric Class:  Appt start time: 1600 end time:  1700.  2 Week Post-Operative Nutrition Class  Patient was seen on 05/15/2012 for Post-Operative [facility]   Surgery date: 05/01/12 Surgery type: RYGB Start weight at [facility]: 200.2 lbs (08/11/11)  Weight today: 184.1 lbs Weight change: 16.1 lbs Total weight lost: 16.1 lbs BMI: 32.7  The following the learning objectives were met by the patient during this course:   Identifies Phase 3A (Soft, High Proteins) Dietary Goals and will begin from 2 weeks post-operatively to 2 months post-operatively  Identifies appropriate sources of fluids and proteins   States protein recommendations and appropriate sources post-operatively  Identifies the need for appropriate texture modifications, mastication, and bite sizes when consuming solids  Identifies appropriate multivitamin and calcium sources post-operatively  Describes the need for physical activity post-operatively and will follow MD recommendations  States when to call healthcare provider regarding medication questions or post-operative complications  Handouts given during class include:  Phase 3A: Soft, High Protein Diet Handout  Follow-Up Plan: Patient will follow-up at [facility] in 6 weeks for 2 months post-op nutrition visit for diet advancement per MD.

## 2012-05-15 NOTE — Patient Instructions (Signed)
Patient to follow Phase 3A-Soft, High Protein Diet and follow-up at [facility] in 6 weeks for 2 months post-op nutrition visit for diet advancement. 

## 2012-05-16 ENCOUNTER — Ambulatory Visit (INDEPENDENT_AMBULATORY_CARE_PROVIDER_SITE_OTHER): Payer: 59 | Admitting: General Surgery

## 2012-05-16 DIAGNOSIS — Z4889 Encounter for other specified surgical aftercare: Secondary | ICD-10-CM

## 2012-05-16 DIAGNOSIS — Z5189 Encounter for other specified aftercare: Secondary | ICD-10-CM

## 2012-05-16 NOTE — Progress Notes (Signed)
Subjective:     Patient ID: Melody Johnson, female   DOB: [birth year], 46 y.o.   MRN: [id number]  HPI  This patient follows up status post laparoscopic Roux-en-Y gastric bypass about 2-3 weeks ago. She doing very well and has lost about 20 pounds since her procedure. She has no abdominal pain and she has no food intolerances. She is walking about 30 minutes per day. She is taking her protein supplements and her multivitamins. Review of Systems     Objective:   Physical Exam In no acute distress and nontoxic-appearing sitting completely of the bed Her abdomen is soft and nontender exam her incisions are healing well without any infection or hernia.    Assessment:     Status post left Roux-en-Y gastric bypass-doing well She is doing very well since her procedure without any complaints. We discussed the postoperative course and recommended that she continue with her protein supplements and vitamins and her exercise. We'll check some laboratory studies and I will see her back in about 4 weeks for repeat evaluation    Plan:     We will see her back in 4 weeks and she will gradually increase activity and diet as tolerated

## 2012-05-17 ENCOUNTER — Encounter (HOSPITAL_COMMUNITY): Payer: Self-pay

## 2012-05-21 IMAGING — CR DG ABDOMEN 1V
2 series · 2 of 2 positions shown · non-contrast
Comparison: Abdomen radiograph 04/30/2011, 05/03/2011.  CT abdomen
pelvis stone study 04/25/2011.

CLINICAL DATA: Ureter calculus.  Left lower quadrant pain for 3
days.  History of stones.

ABDOMEN - 1 VIEW

[view not recorded (1 of 2)]
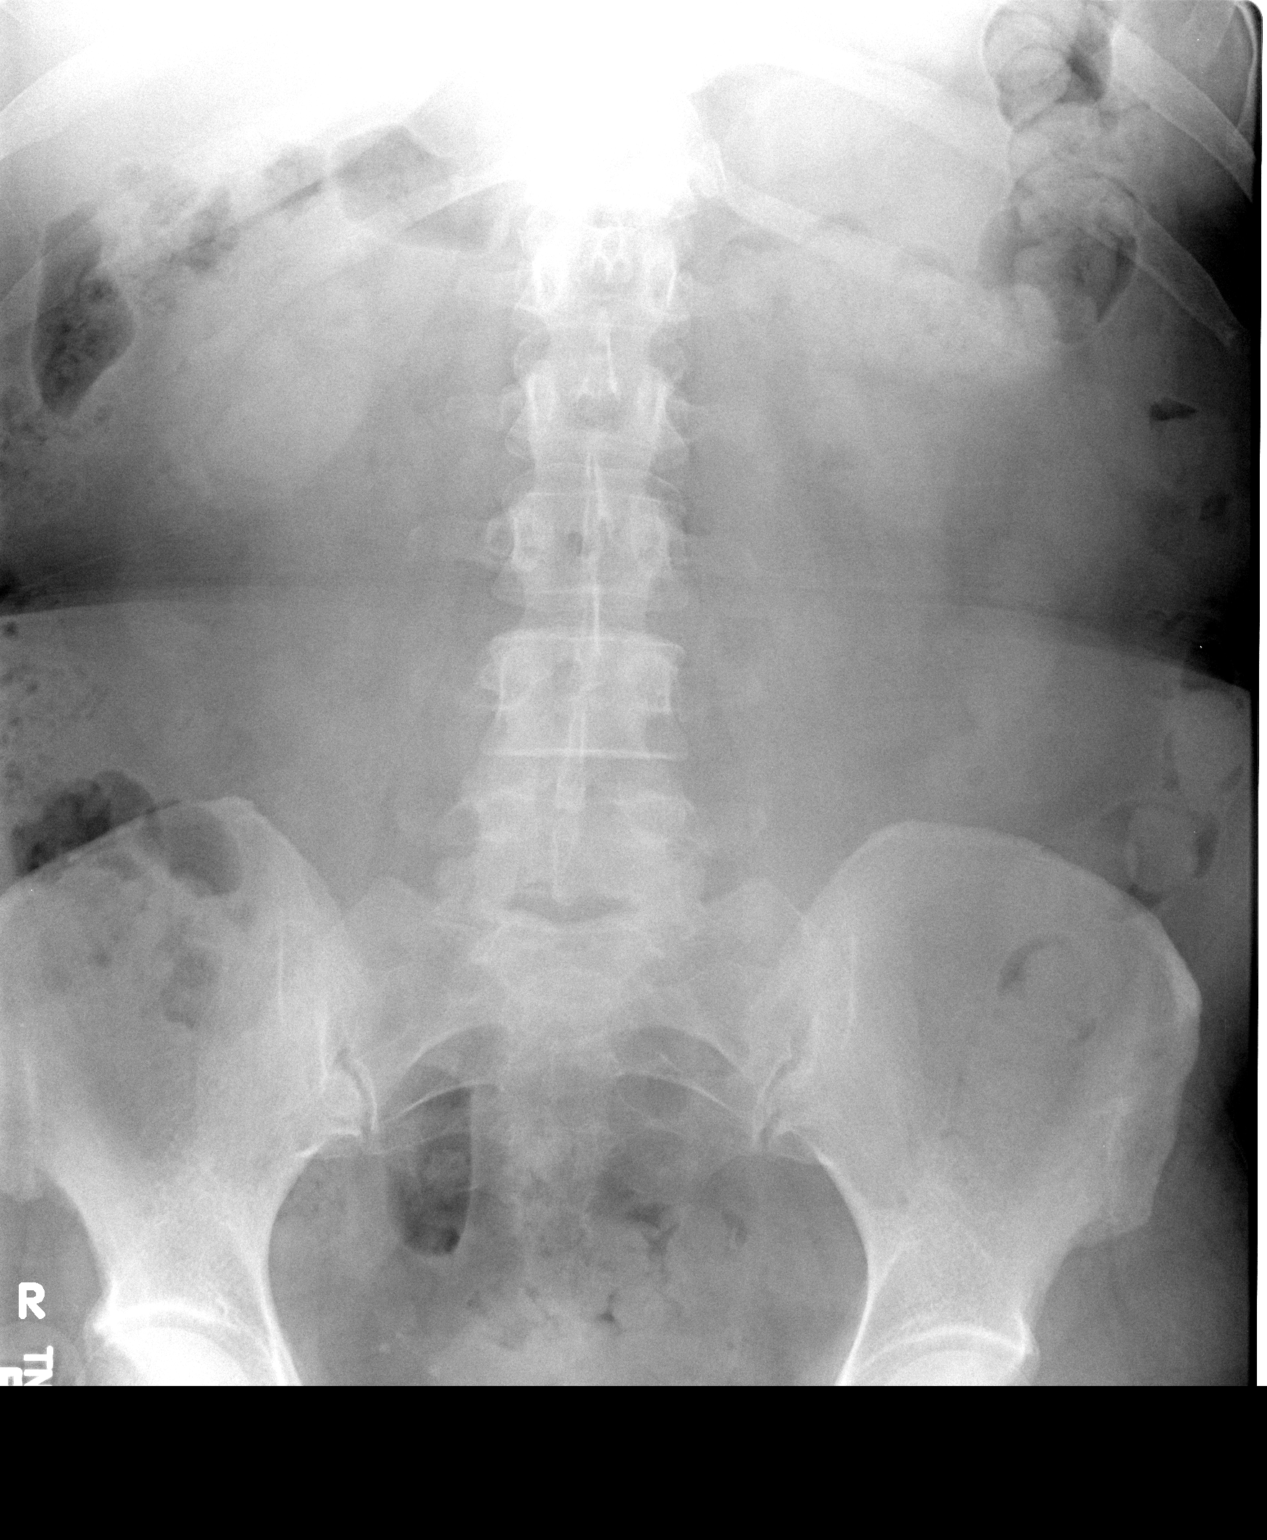

[view not recorded (2 of 2)]
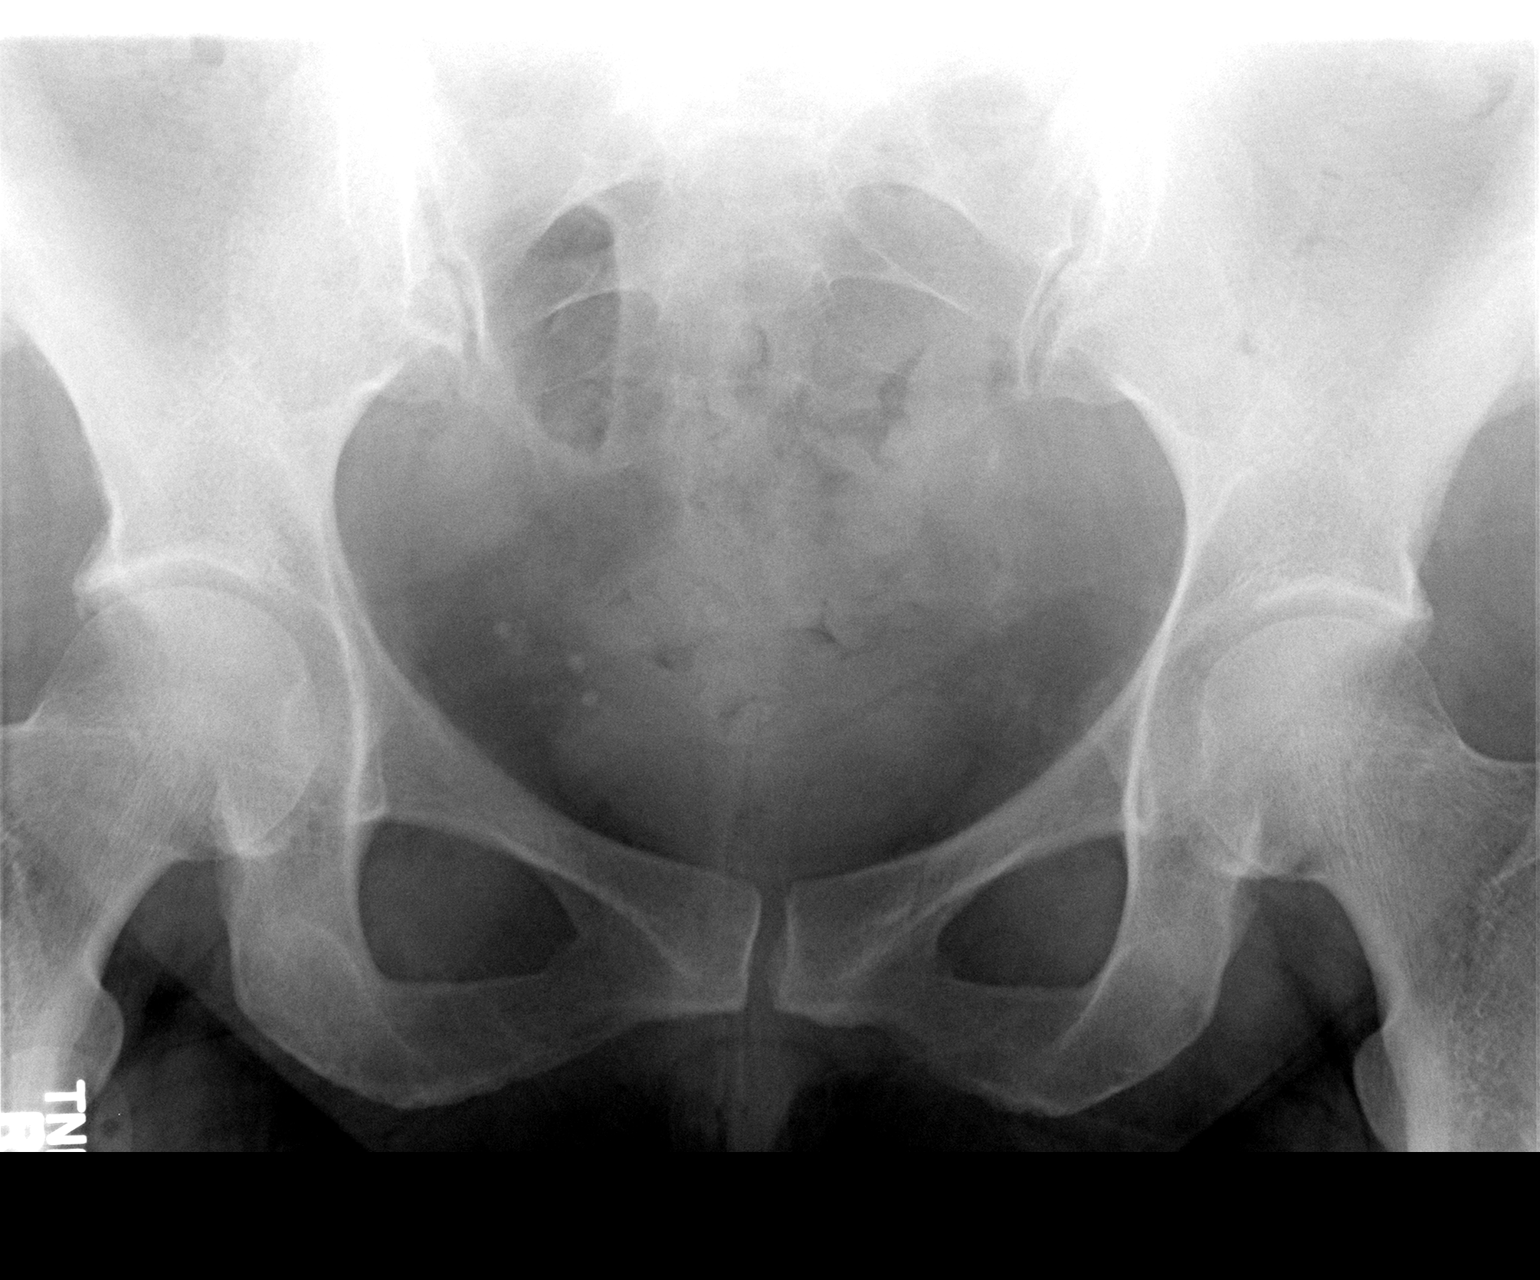

[2 of 2 positions shown; findings below may reference images not displayed]

FINDINGS: Faint oblong calcification projecting over the left
hemipelvis, slightly inferior and medial to the inferior aspect of
sacroiliac joint, appears unchanged compared to the abdomen
radiograph of 05/03/2011.  Several phleboliths in the right pelvis
are noted. No radiopaque calculus is seen over either kidney.  The
bowel gas pattern is nonobstructive.
IMPRESSION: Faint calcification in the left in the pelvis is unchanged and may
reflect the distal left ureteral stone is seen on recent CT.
Please also note that there is a small calcification in the left
internal iliac artery near the same level on recent abdominal
pelvic CT.  Suggest correlation with the patient's symptoms.

## 2012-06-11 LAB — CBC WITH DIFFERENTIAL/PLATELET
Basophils Absolute: 0 10*3/uL (ref 0.0–0.1)
Basophils Relative: 0 % (ref 0–1)
Eosinophils Relative: 5 % (ref 0–5)
Lymphocytes Relative: 46 % (ref 12–46)
MCHC: 34.1 g/dL (ref 30.0–36.0)
Monocytes Absolute: 0.4 10*3/uL (ref 0.1–1.0)
Neutro Abs: 2.5 10*3/uL (ref 1.7–7.7)
Platelets: 351 10*3/uL (ref 150–400)
RDW: 14.3 % (ref 11.5–15.5)
WBC: 6 10*3/uL (ref 4.0–10.5)

## 2012-06-11 LAB — COMPREHENSIVE METABOLIC PANEL
ALT: 70 U/L — ABNORMAL HIGH (ref 0–35)
AST: 52 U/L — ABNORMAL HIGH (ref 0–37)
Alkaline Phosphatase: 88 U/L (ref 39–117)
BUN: 20 mg/dL (ref 6–23)
Calcium: 10.2 mg/dL (ref 8.4–10.5)
Chloride: 108 mEq/L (ref 96–112)
Creat: 0.7 mg/dL (ref 0.50–1.10)
Potassium: 4.2 mEq/L (ref 3.5–5.3)

## 2012-06-11 LAB — IRON AND TIBC
%SAT: 26 % (ref 20–55)
TIBC: 409 ug/dL (ref 250–470)

## 2012-06-14 ENCOUNTER — Ambulatory Visit (INDEPENDENT_AMBULATORY_CARE_PROVIDER_SITE_OTHER): Payer: 59 | Admitting: General Surgery

## 2012-06-14 ENCOUNTER — Encounter (INDEPENDENT_AMBULATORY_CARE_PROVIDER_SITE_OTHER): Payer: Self-pay | Admitting: General Surgery

## 2012-06-14 VITALS — BP 104/76 | HR 71 | Temp 97.8°F | Resp 16 | Ht 63.0 in | Wt 176.2 lb

## 2012-06-14 DIAGNOSIS — Z4889 Encounter for other specified surgical aftercare: Secondary | ICD-10-CM

## 2012-06-14 DIAGNOSIS — Z5189 Encounter for other specified aftercare: Secondary | ICD-10-CM

## 2012-06-14 NOTE — Progress Notes (Signed)
Subjective:     Patient ID: Melody Johnson, female   DOB: [birth year], 46 y.o.   MRN: [id number]  HPI 6 weeks s/p lap rygb.  She has only lost about 5-6 lbs since last visit.  She also says that she has thrown up about 2/week about 5 min. After eating.  No specific food association.  She ate chicken, potatoes, and green beans last night without any problem.  No abdominal pains or fevers.  Taking protein and vitamins.  Bowels okay.  She has not had any vomiting this week. Labs noted and okay. She is off all of her diabetes medications and she says that her sugars are very well controlled.  Review of Systems     Objective:   Physical Exam NAD, nontoxic Abdomen appropriate     Assessment:     S/p lap RYGB with some vomiting It really does not sound like she is strictured because the vomiting is inconsistent.  It is not just with solid foods. She has been taking some candy and I think that some of her lack of weight loss may be due to some food choices. I recommended that she not have any restricted diet, consistent standpoint and focus more on the quality and quantity of food sticking in. I also recommended that she continue with her exercise daily. I would like to see her back in about 4 weeks and if she is still having vomiting at that time, then we will check an upper GI or so her up for upper endoscopy to evaluate for stricture.    Plan:     I will see her back in about 4 weeks for repeat evaluation.

## 2012-06-26 ENCOUNTER — Encounter: Payer: 59 | Attending: General Surgery | Admitting: *Deleted

## 2012-06-26 ENCOUNTER — Encounter: Payer: Self-pay | Admitting: *Deleted

## 2012-06-26 VITALS — Ht 63.0 in | Wt 172.5 lb

## 2012-06-26 DIAGNOSIS — Z713 Dietary counseling and surveillance: Secondary | ICD-10-CM | POA: Insufficient documentation

## 2012-06-26 DIAGNOSIS — E669 Obesity, unspecified: Secondary | ICD-10-CM

## 2012-06-26 DIAGNOSIS — Z01818 Encounter for other preprocedural examination: Secondary | ICD-10-CM | POA: Insufficient documentation

## 2012-06-26 NOTE — Patient Instructions (Addendum)
Goals:  Follow Phase 3B: High Protein + Non-Starchy Vegetables  Eat 3-6 small meals/snacks, every 3-5 hrs  Increase lean protein foods to meet 60-80g goal  Increase fluid intake to 64oz +  Avoid drinking 15 minutes before, during and 30 minutes after eating  Aim for >30 min of physical activity daily 

## 2012-06-26 NOTE — Progress Notes (Addendum)
Follow-up visit:  8 Weeks Post-Operative RYGB Surgery  Medical Nutrition Therapy:  Appt start time: 0915 end time:  0945.  Primary concerns today: Post-operative Bariatric Surgery Nutrition Management. Reports her previous vomiting of 2-3 times/week has settled down. Thinks she was eating too fast.  Doing well overall, though not sure she is losing weight quickly enough.   Surgery date: 05/01/12 Surgery type: RYGB Start weight at [facility]: 200.2 lbs (08/11/11)  Weight today: 172.5 lbs Weight change: 11.6 lbs Total weight lost: 27.7 lbs  TANITA  BODY COMP RESULTS  08/11/11 06/26/12   BMI (kg/m^2) 35.1 30.6   Fat Mass (lbs) 93.5 75.0   Fat Free Mass (lbs) 104.5 97.5   Total Body Water (lbs) 76.5 71.5   24-hr recall: B (AM): Premier shake (30g) OR scrambled egg Snk (AM): NONE  L (PM): 8-10 oz Soup (chicken noodle/tomato/vegetable) Snk (PM): NONE  D (PM): Chicken and 1 dumpling; tried tomato sauce with a few noodles and garlic bread last night - vomited Snk (PM): 3 oz Oikos greek yogurt (vanilla or strawberry) OR tootsie pop (nightly) Snk (PM): 8 oz Trop 50  Fluid intake: 11 oz (shake); 17 oz water, 20 oz G2 low calorie gatorade, 8-10 oz (soup) = 55-60 oz  Estimated total protein intake: 60+ oz  Medications: No longer taking Metformin Supplementation: Taking as directed  CBG monitoring: Daily  Average CBG per patient:     FBG: 106 mg     1 hr PP: 130-140 mg Last patient reported A1c: 8.2% (08/04/11)  Using straws: No Drinking while eating: Sip or two if something feels stuck Hair loss: No Carbonated beverages: No N/V/D/C: No longer vomiting Dumping syndrome: None  Recent physical activity:  Walks up and down neighbor's driveway for 30-45 min 3 days/week.   Progress Towards Goal(s):  In progress.  Handouts given during visit include:  Phase 3B: High Protein + Non-Starchy Vegetables   Nutritional Diagnosis:  [place]-3.3 Overweight/obesity related to past poor dietary habits and  physical inactivity as evidenced by patient w/ recent RYGB surgery following dietary guidelines for continued weight loss.   Intervention:  Nutrition education/diet advancement.  Monitoring/Evaluation:  Dietary intake, exercise, lap band fills, and body weight. Follow up in 1 months for 3 month post-op visit.

## 2012-07-18 ENCOUNTER — Ambulatory Visit (INDEPENDENT_AMBULATORY_CARE_PROVIDER_SITE_OTHER): Payer: 59 | Admitting: General Surgery

## 2012-07-24 ENCOUNTER — Encounter: Payer: Self-pay | Admitting: *Deleted

## 2012-07-24 ENCOUNTER — Encounter: Payer: 59 | Attending: General Surgery | Admitting: *Deleted

## 2012-07-24 VITALS — Ht 63.0 in | Wt 164.5 lb

## 2012-07-24 DIAGNOSIS — Z713 Dietary counseling and surveillance: Secondary | ICD-10-CM | POA: Insufficient documentation

## 2012-07-24 DIAGNOSIS — Z01818 Encounter for other preprocedural examination: Secondary | ICD-10-CM | POA: Insufficient documentation

## 2012-07-24 DIAGNOSIS — E669 Obesity, unspecified: Secondary | ICD-10-CM

## 2012-07-24 NOTE — Progress Notes (Addendum)
Follow-up visit:  12 Weeks Post-Operative RYGB Surgery  Medical Nutrition Therapy:  Appt start time: 0830  End time:  0900.  Primary concerns today: Post-operative Bariatric Surgery Nutrition Management. Reports her previous vomiting of 2-3 times/week has resolved. No other problems reported.  Surgery date: 05/01/12 Surgery type: RYGB Start weight at [facility]: 200.2 lbs (08/11/11)  Weight today: 164.5 lbs Weight change: 8.0 lbs Total weight lost: 35.7 lbs  Weight loss goal: 120-140 lbs % goal met: 45-59%  TANITA  BODY COMP RESULTS  08/11/11 06/26/12 07/23/12   BMI (kg/m^2) 35.1 30.6 29.1   Fat Mass (lbs) 93.5 75.0 69.5   Fat Free Mass (lbs) 104.5 97.5 95.0   Total Body Water (lbs) 76.5 71.5 69.5   24-hr recall: B (AM): Premier shake (30g) OR scrambled egg Snk (AM): NONE  L (PM): 8-10 oz Soup (chicken noodle/tomato/vegetable) Snk (PM): NONE; Every now and then will have 1/2 snack bag of doritos D (PM): (1/4 c) Pintos, mustard greens, mexican cornbread (2 oz)  Snk (PM): 3 oz Oikos greek yogurt (vanilla or strawberry)  Snk (PM): Decaf unsweet tea w/ splenda  Fluid intake: 11 oz (shake); 17 oz water, 20 oz G2 low calorie gatorade, 8-10 oz (soup) = 55-60 oz  Estimated total protein intake: 60+ g  Medications: No longer taking Metformin Supplementation: Taking as directed; sometimes misses calcium  CBG monitoring: Daily  Average CBG per patient:  FBG: 96-98 mg  Last patient reported A1c: 8.2% (08/04/11)  Using straws: No Drinking while eating: Sip or two if something feels stuck Hair loss: Mild; not worrisome  Carbonated beverages: No N/V/D/C:  Has problems tolerating beef  Dumping syndrome: None  Recent physical activity:  Continues to walk up and down neighbor's driveway for 30-45 min 2-3 days/week. Just signed up for 5K with her family - 10 wks of training starts in Feb.   Progress Towards Goal(s):  In progress.  Handouts given during visit include:  Meal Planning Card    Samples given during visit include:   Unjury Protein Powder Lot # [id number]; Exp: 06/15 - 1 pkt Lot # [id number]; Exp: 02/15 - 1 pkt   Nutritional Diagnosis:  [place]-3.3 Overweight/obesity related to past poor dietary habits and physical inactivity as evidenced by patient w/ recent RYGB surgery following dietary guidelines for continued weight loss.   Intervention:  Nutrition education/diet advancement.  Monitoring/Evaluation:  Dietary intake, exercise,and body weight. Follow up in 3 months for 6 month post-op visit.

## 2012-07-24 NOTE — Patient Instructions (Addendum)
Goals:  Follow Phase 3B: High Protein + Non-Starchy Vegetables  Increase lean protein foods to meet 60-80g goal  Increase fluid intake to 64oz +  Add 15 grams of carbohydrate (fruit, whole grain) with meals  Aim for >30 min of physical activity daily - YEA on the 5K!! :)  Take calcium citrate in 3 doses of 500 mg daily  TANITA  BODY COMP RESULTS  08/11/11 06/26/12 07/23/12   BMI (kg/m^2) 35.1 30.6 29.1   Fat Mass (lbs) 93.5 75.0 69.5   Fat Free Mass (lbs) 104.5 97.5 95.0   Total Body Water (lbs) 76.5 71.5 69.5

## 2012-08-02 ENCOUNTER — Ambulatory Visit (INDEPENDENT_AMBULATORY_CARE_PROVIDER_SITE_OTHER): Payer: 59 | Admitting: General Surgery

## 2012-08-03 ENCOUNTER — Ambulatory Visit (INDEPENDENT_AMBULATORY_CARE_PROVIDER_SITE_OTHER): Payer: 59 | Admitting: General Surgery

## 2012-08-03 ENCOUNTER — Encounter (INDEPENDENT_AMBULATORY_CARE_PROVIDER_SITE_OTHER): Payer: Self-pay | Admitting: General Surgery

## 2012-08-03 VITALS — BP 112/70 | HR 71 | Resp 16 | Ht 63.5 in | Wt 163.6 lb

## 2012-08-03 DIAGNOSIS — K912 Postsurgical malabsorption, not elsewhere classified: Secondary | ICD-10-CM

## 2012-08-03 NOTE — Progress Notes (Signed)
Subjective:     Patient ID: Melody Johnson, female   DOB: 04-27-1966, 47 y.o.   MRN: [id number]  HPI This patient follows up 3 months status post endoscopic Roux-en-Y gastric bypass.  She is very pleased with her weight loss to this point. She has lost about 30 poundsbut would likely lose another 20-25 pounds. She is no longer taking any diabetic medications but remains on her blood pressure medication and cholesterol medication. She is taking her vitamin supplements and protein supplements and denies any dumping syndrome or numbness or tingling or other neurologic complaints Review of Systems     Objective:   Physical Exam No distress and nontoxic-appearing Her abdomen is soft nontender exam her incisions are well-healed without signs of infection there is no evidence of any neurologic deficits    Assessment:     Status post Roux-en-Y gastric bypass-doing well She seems to be doing well after her bypass. There is no evidence of any neurologic or vitamin deficiencies although we will check her nutrition labs today. She is off all her diabetes medications and I recommended that she followup with her primary care physician to discuss whether she can come off of her high blood pressure medication and a cholesterol medication. She says that she is going to start running and as enrolled in the 5K    Plan:     Continue with high protein low carbohydrate diet and we will check some nutrition labs and I will see her back in 3 months

## 2012-08-11 ENCOUNTER — Other Ambulatory Visit: Payer: Self-pay

## 2012-08-15 LAB — LIPID PANEL
HDL: 39 mg/dL — ABNORMAL LOW (ref >39)
LDL Cholesterol: 82 mg/dL (ref 0–99)
Total CHOL/HDL Ratio: 3.5 Ratio
Triglycerides: 85 mg/dL (ref <150)

## 2012-08-15 LAB — COMPREHENSIVE METABOLIC PANEL
Albumin: 4.5 g/dL (ref 3.5–5.2)
Alkaline Phosphatase: 102 U/L (ref 39–117)
BUN: 20 mg/dL (ref 6–23)
CO2: 27 mEq/L (ref 19–32)
Calcium: 10.3 mg/dL (ref 8.4–10.5)
Chloride: 107 mEq/L (ref 96–112)
Glucose, Bld: 96 mg/dL (ref 70–99)
Potassium: 4.1 mEq/L (ref 3.5–5.3)
Sodium: 145 mEq/L (ref 135–145)
Total Protein: 7 g/dL (ref 6.0–8.3)

## 2012-08-15 LAB — CBC WITH DIFFERENTIAL/PLATELET
Basophils Relative: 0 % (ref 0–1)
HCT: 39.3 % (ref 36.0–46.0)
Hemoglobin: 13.5 g/dL (ref 12.0–15.0)
Lymphocytes Relative: 39 % (ref 12–46)
Lymphs Abs: 2.6 10*3/uL (ref 0.7–4.0)
MCHC: 34.4 g/dL (ref 30.0–36.0)
Monocytes Absolute: 0.7 10*3/uL (ref 0.1–1.0)
Monocytes Relative: 10 % (ref 3–12)
Neutro Abs: 3.1 10*3/uL (ref 1.7–7.7)
Neutrophils Relative %: 45 % (ref 43–77)
RBC: 4.25 MIL/uL (ref 3.87–5.11)

## 2012-08-15 LAB — IBC PANEL
%SAT: 31 % (ref 20–55)
TIBC: 410 ug/dL (ref 250–470)

## 2012-08-19 LAB — VITAMIN D 1,25 DIHYDROXY: Vitamin D3 1, 25 (OH)2: 64 pg/mL

## 2012-09-12 ENCOUNTER — Other Ambulatory Visit (HOSPITAL_COMMUNITY)
Admission: RE | Admit: 2012-09-12 | Discharge: 2012-09-12 | Disposition: A | Discharge status: Home / Self Care | Payer: 59 | Source: Ambulatory Visit | Attending: Obstetrics and Gynecology | Admitting: Obstetrics and Gynecology

## 2012-09-12 ENCOUNTER — Encounter: Payer: Self-pay | Admitting: Adult Health

## 2012-09-12 ENCOUNTER — Other Ambulatory Visit: Payer: Self-pay | Admitting: Adult Health

## 2012-09-12 ENCOUNTER — Ambulatory Visit (INDEPENDENT_AMBULATORY_CARE_PROVIDER_SITE_OTHER): Payer: 59 | Admitting: Adult Health

## 2012-09-12 VITALS — BP 142/88 | HR 74 | Ht 63.0 in | Wt 157.0 lb

## 2012-09-12 DIAGNOSIS — Z1212 Encounter for screening for malignant neoplasm of rectum: Secondary | ICD-10-CM

## 2012-09-12 DIAGNOSIS — Z01419 Encounter for gynecological examination (general) (routine) without abnormal findings: Secondary | ICD-10-CM

## 2012-09-12 DIAGNOSIS — Z0000 Encounter for general adult medical examination without abnormal findings: Secondary | ICD-10-CM

## 2012-09-12 DIAGNOSIS — Z1151 Encounter for screening for human papillomavirus (HPV): Secondary | ICD-10-CM | POA: Insufficient documentation

## 2012-09-12 NOTE — Progress Notes (Signed)
Subjective:     Patient ID: Melody Johnson, female   DOB: May 14, 1966, 47 y.o.   MRN: [id number]  HPICarlene is a 47 yo wf in for pap and physical.She had gastric bypass in November and has lost 45 lbs. And has stopped several medication. And she is training for a 5K.   Review of SystemsShe denies any headache, blurred vision, sob, chest pain, abdomen pain, problems with bowel movements, urination or intercourse.      Objective:   Physical Exam  Vitals reviewed. Constitutional: She is oriented to person, place, and time. She appears well-developed and well-nourished.  Eyes: Pupils are equal, round, and reactive to light.  Neck: Normal range of motion. No tracheal deviation present. No thyromegaly present.  Cardiovascular: Normal rate, regular rhythm and normal heart sounds.   Pulmonary/Chest: Effort normal and breath sounds normal.  Abdominal: Soft. Bowel sounds are normal. She exhibits no distension and no mass. There is no tenderness. There is no rebound and no guarding.  Genitourinary: Vagina normal and uterus normal. Guaiac negative stool. No vaginal discharge found.  Musculoskeletal: Normal range of motion.  Neurological: She is alert and oriented to person, place, and time.  Skin: Skin is warm and dry.  Breast: no dominant mass, retraction or nipple discharge.     Assessment:     Yearly Exam and Pap, history anxiety    Plan:    Continue diet and exercise plan, get mammogram yearly, colonoscopy at 51, follow up in 1 year for Pap, if HPV negative can go 3 years on next pap.

## 2012-09-12 NOTE — Patient Instructions (Signed)
Continue current diet and exercise plan get mammogram as scheduled, colonoscopy at 51, follow up physical in 1 year. Call with any problems

## 2012-10-22 ENCOUNTER — Encounter: Payer: BC Managed Care – PPO | Attending: General Surgery | Admitting: *Deleted

## 2012-10-22 ENCOUNTER — Encounter: Payer: Self-pay | Admitting: *Deleted

## 2012-10-22 DIAGNOSIS — Z09 Encounter for follow-up examination after completed treatment for conditions other than malignant neoplasm: Secondary | ICD-10-CM | POA: Insufficient documentation

## 2012-10-22 DIAGNOSIS — Z713 Dietary counseling and surveillance: Secondary | ICD-10-CM | POA: Insufficient documentation

## 2012-10-22 DIAGNOSIS — Z9884 Bariatric surgery status: Secondary | ICD-10-CM | POA: Insufficient documentation

## 2012-10-22 DIAGNOSIS — E669 Obesity, unspecified: Secondary | ICD-10-CM | POA: Insufficient documentation

## 2012-10-22 NOTE — Progress Notes (Addendum)
Follow-up visit:  5-6 Months Post-Operative RYGB Surgery  Medical Nutrition Therapy:  Appt start time: 0915  End time:  0945.  Primary concerns today: Post-operative Bariatric Surgery Nutrition Management. Melody Johnson is here for 6 mo f/u with a total overall weight loss of of 45.2 lbs (56-75% of goal). Doing well with no problems reported other than issues with McCormick seasoning packets, which cause her to vomit immediately.           Surgery date: 05/01/12 Surgery type: RYGB Start weight at [facility]: 200.2 lbs (08/11/11)  Weight today: 155.0 lbs Weight change: 9.5 lbs Total weight lost: 45.2 lbs  Weight loss goal: 120-140 lbs % goal met: 56-75%  TANITA  BODY COMP RESULTS  08/11/11 06/26/12 07/23/12 10/22/12   BMI (kg/m^2) 35.1 30.6 29.1 27.5   Fat Mass (lbs) 93.5 75.0 69.5 59.0   Fat Free Mass (lbs) 104.5 97.5 95.0 96.0   Total Body Water (lbs) 76.5 71.5 69.5 70.5   24-hr recall: B (AM): Premier shake (30g) OR Dannon [place] yogurt Snk (AM): NONE  L (PM): 6-8 oz Soup (chicken noodle/tomato/vegetable) OR yogurt and doritos (snack bag) Snk (PM): NONE; has water or Gatorade G2 low cal D (PM): 3 oz chicken or beef with onions, peppers Snk (PM): NONE  Fluid intake: 11 oz (shake), 50 oz water, 16 oz G2 low calorie gatorade = 55-60 oz  Estimated total protein intake: 60+ g  Medications:  Not taking penciclovir Supplementation: Taking as directed;  CBG monitoring: 2-3 times/week  Average CBG per patient:  FBG: 96-98 mg  Last patient reported A1c: 8.2% (08/04/11) - printed order slip for new lab   Using straws: No Drinking while eating: Sip or two if something feels stuck Hair loss: Mild; not worrisome  Carbonated beverages: Yes; Puts 12 oz regular Sprite in a "freezer cup" with gel inside (daily) - turns to slushie and decreases carbonation N/V/D/C:  Has problems tolerating crock pot meals when using McCormick seasoning packets Dumping syndrome: None  Recent physical activity:  Ran 5K  last weekend. Walking during [employer] soccer practice, staying very active.   Progress Towards Goal(s):  In progress.  Nutritional Diagnosis:  [place]-3.3 Overweight/obesity related to past poor dietary habits and physical inactivity as evidenced by patient w/ recent RYGB surgery following dietary guidelines for continued weight loss.   Intervention:  Nutrition education/diet advancement.  Monitoring/Evaluation:  Dietary intake, exercise,and body weight. Follow up in 6 mos for 12 month post-op visit.

## 2012-10-22 NOTE — Patient Instructions (Addendum)
Goals:  Follow Phase 3B: High Protein + Non-Starchy Vegetables  Add a small snack in between meals to keep from from overeating at dinner meal  Increase lean protein foods to meet 60-80g goal  Increase fluid intake to 64oz +  Add 15 grams of carbohydrate (fruit, whole grains) with meals  Aim for >30 min of physical activity daily - YEA on the 5K!! :)

## 2013-03-12 ENCOUNTER — Other Ambulatory Visit: Payer: Self-pay | Admitting: Obstetrics and Gynecology

## 2013-03-12 DIAGNOSIS — Z139 Encounter for screening, unspecified: Secondary | ICD-10-CM

## 2013-03-13 ENCOUNTER — Encounter: Payer: Self-pay | Admitting: *Deleted

## 2013-03-13 NOTE — Progress Notes (Signed)
Set samples up front for patient:   BariActiv MVI: 2 pkts Lot: [id number]; Exp: 05/16  BariActiv Iron: 2 pkts Lot: [id number]; Exp: 05/16  BariActiv Calcium + D3/Mg: 2 pkts Lot: [id number]; Exp: 05/16  Dyke Brackett MVI: 1 bottle @ 5 tabs/bottle Lot: [id number]; Exp: 03/17

## 2013-04-19 ENCOUNTER — Ambulatory Visit (HOSPITAL_COMMUNITY)
Admission: RE | Admit: 2013-04-19 | Discharge: 2013-04-19 | Disposition: A | Discharge status: Home / Self Care | Payer: BC Managed Care – PPO | Source: Ambulatory Visit | Attending: Obstetrics and Gynecology | Admitting: Obstetrics and Gynecology

## 2013-04-19 DIAGNOSIS — Z139 Encounter for screening, unspecified: Secondary | ICD-10-CM

## 2013-04-19 DIAGNOSIS — Z1231 Encounter for screening mammogram for malignant neoplasm of breast: Secondary | ICD-10-CM | POA: Insufficient documentation

## 2013-04-30 ENCOUNTER — Ambulatory Visit: Payer: BC Managed Care – PPO | Admitting: Dietician

## 2013-05-02 ENCOUNTER — Other Ambulatory Visit: Payer: Self-pay

## 2013-05-15 ENCOUNTER — Encounter: Payer: BC Managed Care – PPO | Attending: General Surgery | Admitting: Dietician

## 2013-05-15 VITALS — Ht 63.0 in | Wt 146.0 lb

## 2013-05-15 DIAGNOSIS — Z713 Dietary counseling and surveillance: Secondary | ICD-10-CM | POA: Insufficient documentation

## 2013-05-15 DIAGNOSIS — E669 Obesity, unspecified: Secondary | ICD-10-CM | POA: Insufficient documentation

## 2013-05-15 NOTE — Progress Notes (Signed)
Follow-up visit:  12 Months Post-Operative RYGB Surgery  Medical Nutrition Therapy:  Appt start time: 0945  End time:  1015.  Primary concerns today: Post-operative Bariatric Surgery Nutrition Management. Melody Johnson is here for 12 mo f/u with a total overall weight loss of of . States she is not able to tolerate pasta or rice.   45.2 lbs (56-75% of goal). Doing well with no problems reported other than issues with McCormick seasoning packets, which cause her to vomit immediately.           Surgery date: 05/01/12 Surgery type: RYGB Start weight at [facility]: 200.2 lbs (08/11/11)  Weight today: 155.0 lbs Weight change: 9.5 lbs Total weight lost: 45.2 lbs  Weight loss goal: 120-140 lbs % goal met: 56-75%  TANITA  BODY COMP RESULTS  08/11/11 06/26/12 07/23/12 10/22/12 05/15/13   BMI (kg/m^2) 35.1 30.6 29.1 27.5 25.9   Fat Mass (lbs) 93.5 75.0 69.5 59.0 53.0   Fat Free Mass (lbs) 104.5 97.5 95.0 96.0 93.0   Total Body Water (lbs) 76.5 71.5 69.5 70.5 68.0   24-hr recall: B (AM): Premier shake (30g) OR Dannon [place] yogurt Snk (AM): NONE  L (PM): skips 2 x week, chick fil-a 4 nugget (breaded) or soup or a protein shake Snk (PM): NONE  D (PM): 3 oz chicken or beef with onions, peppers Snk (PM): popcorn or bag of Doritos, bugels, or pistachios  Fluid intake: coffee, frozen sprite,  32 oz water 11 oz (shake), 55-60 oz  Estimated total protein intake: 60+ g  Medications:  Not taking penciclovir Supplementation: Taking as directed;  CBG monitoring: hasn't checked in a while Average CBG per patient:  hasn't checked in a while Last patient reported A1c: 5.8% a couple months ago  Using straws: No Drinking while eating: Sip or two if something feels stuck Hair loss: No Carbonated beverages: Yes; Puts 12 oz regular Sprite in a "freezer cup" with gel inside (daily) - turns to slushie and decreases carbonation N/V/D/C:  No Dumping syndrome: None  Recent physical activity:  Walking 4 x week for  3-45 minutes   Progress Towards Goal(s):  In progress.  Nutritional Diagnosis:  [place]-3.3 Overweight/obesity related to past poor dietary habits and physical inactivity as evidenced by patient w/ recent RYGB surgery following dietary guidelines for continued weight loss.   Intervention:  Nutrition education/diet advancement.  Monitoring/Evaluation:  Dietary intake, exercise,and body weight. Follow up prn.

## 2013-05-15 NOTE — Progress Notes (Deleted)
Set samples up front for patient:   BariActiv MVI: 2 pkts Lot: [id number]; Exp: 05/16  BariActiv Iron: 2 pkts Lot: [id number]; Exp: 05/16  BariActiv Calcium + D3/Mg: 2 pkts Lot: [id number]; Exp: 05/16  Dyke Brackett MVI: 1 bottle @ 5 tabs/bottle Lot: [id number]; Exp: 03/17

## 2013-05-15 NOTE — Patient Instructions (Signed)
Goals:  Follow Phase IV: Low fat, low carb, solid foods  Try to plan for lunch and snacks (High Protein + NS vegetables or less than 15 g carbohydrates)  Increase lean protein foods to meet 60-80g goal, try to get most protein from foods  Increase fluid intake to 64oz +  Limit calories from high carb or high fat foods like Sprite or chips  Continue exercising, with a goal of >30 min of physical activity daily

## 2013-10-09 ENCOUNTER — Other Ambulatory Visit: Payer: 59 | Admitting: Adult Health

## 2013-10-22 ENCOUNTER — Other Ambulatory Visit: Payer: 59 | Admitting: Adult Health

## 2013-11-05 ENCOUNTER — Ambulatory Visit (INDEPENDENT_AMBULATORY_CARE_PROVIDER_SITE_OTHER): Payer: BC Managed Care – PPO | Admitting: Adult Health

## 2013-11-05 ENCOUNTER — Encounter: Payer: Self-pay | Admitting: Adult Health

## 2013-11-05 VITALS — BP 100/60 | HR 74 | Ht 63.0 in | Wt 155.0 lb

## 2013-11-05 DIAGNOSIS — Z01419 Encounter for gynecological examination (general) (routine) without abnormal findings: Secondary | ICD-10-CM

## 2013-11-05 DIAGNOSIS — Z1212 Encounter for screening for malignant neoplasm of rectum: Secondary | ICD-10-CM

## 2013-11-05 LAB — HEMOCCULT GUIAC POC 1CARD (OFFICE): Fecal Occult Blood, POC: NEGATIVE

## 2013-11-05 NOTE — Patient Instructions (Signed)
Physical in 1 year Mammogram yearly Colonoscopy at 51  

## 2013-11-05 NOTE — Progress Notes (Signed)
Patient ID: Laural Goldenarlene Ambroise, female   DOB: 11/23/1965, 48 y.o.   MRN: [id number] History of Present Illness: Jesse FallCarlene is a 48 year old white female in for physical, she had a normal pap with negative HPV 09/12/12. She had gastric by pass and is off BP and diabetic meds.  Current Medications, Allergies, Past Medical History, Past Surgical History, Family History and Social History were reviewed in [facility] electronic medical record.     Review of Systems: Patient denies any headaches, blurred vision, shortness of breath, chest pain, abdominal pain, problems with bowel movements, urination, no joint pain, moods good but has had some discomfort with intercourse.    Physical Exam:BP 100/60  Pulse 74  Ht 5\' 3"  (1.6 m)  Wt 155 lb (70.308 kg)  BMI 27.46 kg/m2 General:  Well developed, well nourished, no acute distress Skin:  Warm and dry Neck:  Midline trachea, normal thyroid Lungs; Clear to auscultation bilaterally Breast:  No dominant palpable mass, retraction, or nipple discharge Cardiovascular: Regular rate and rhythm Abdomen:  Soft, non tender, no hepatosplenomegaly Pelvic:  External genitalia is normal in appearance.  The vagina is normal in appearance.  The cervix is smooth .  Uterus is felt to be normal size, shape, and contour.  No  adnexal masses or tenderness noted.Can reproduce discomfort with finger. Rectal: Good sphincter tone, no polyps, or hemorrhoids felt.  Hemoccult negative. Extremities:  No swelling or varicosities noted Psych:  No mood changes, alert and cooperative, seems happy Discussed positions with sex and foreplay.  Impression: Yearly gyn exam no pap    Plan: Change positions with sex and use good lubricate like astro glide Physical in 1 year Mammogram yearly  Colonoscopy at [facility] with PCP

## 2014-03-20 ENCOUNTER — Other Ambulatory Visit: Payer: Self-pay | Admitting: Obstetrics and Gynecology

## 2014-03-20 DIAGNOSIS — Z139 Encounter for screening, unspecified: Secondary | ICD-10-CM

## 2014-04-24 ENCOUNTER — Ambulatory Visit (HOSPITAL_COMMUNITY)
Admission: RE | Admit: 2014-04-24 | Discharge: 2014-04-24 | Disposition: A | Discharge status: Home / Self Care | Payer: BC Managed Care – PPO | Source: Ambulatory Visit | Attending: Obstetrics and Gynecology | Admitting: Obstetrics and Gynecology

## 2014-04-24 DIAGNOSIS — Z1231 Encounter for screening mammogram for malignant neoplasm of breast: Secondary | ICD-10-CM | POA: Insufficient documentation

## 2014-04-24 DIAGNOSIS — Z139 Encounter for screening, unspecified: Secondary | ICD-10-CM

## 2014-07-17 NOTE — Patient Instructions (Signed)
Your procedure is scheduled on: 07/24/2014  Report to [facility] at  1030   AM.  Call this number if you have problems the morning of surgery: [phone]   Do not eat food or drink liquids :After Midnight.      Take these medicines the morning of surgery with A SIP OF WATER: xanax, zyrtec, fluvoxamine, prilosec, valtrex   Do not wear jewelry, make-up or nail polish.  Do not wear lotions, powders, or perfumes.  Do not shave 48 hours prior to surgery.  Do not bring valuables to the hospital.  Contacts, dentures or bridgework may not be worn into surgery.  Leave suitcase in the car. After surgery it may be brought to your room.  For patients admitted to the hospital, checkout time is 11:00 AM the day of discharge.   Patients discharged the day of surgery will not be allowed to drive home.  :     Please read over the following fact sheets that you were given: Coughing and Deep Breathing, Surgical Site Infection Prevention, Anesthesia Post-op Instructions and Care and Recovery After Surgery    Cataract A cataract is a clouding of the lens of the eye. When a lens becomes cloudy, vision is reduced based on the degree and nature of the clouding. Many cataracts reduce vision to some degree. Some cataracts make people more near-sighted as they develop. Other cataracts increase glare. Cataracts that are ignored and become worse can sometimes look white. The white color can be seen through the pupil. CAUSES   Aging. However, cataracts may occur at any age, even in newborns.   Certain drugs.   Trauma to the eye.   Certain diseases such as diabetes.   Specific eye diseases such as chronic inflammation inside the eye or a sudden attack of a rare form of glaucoma.   Inherited or acquired medical problems.  SYMPTOMS   Gradual, progressive drop in vision in the affected eye.   Severe, rapid visual loss. This most often happens when trauma is the cause.  DIAGNOSIS  To detect a cataract, an eye  doctor examines the lens. Cataracts are best diagnosed with an exam of the eyes with the pupils enlarged (dilated) by drops.  TREATMENT  For an early cataract, vision may improve by using different eyeglasses or stronger lighting. If that does not help your vision, surgery is the only effective treatment. A cataract needs to be surgically removed when vision loss interferes with your everyday activities, such as driving, reading, or watching TV. A cataract may also have to be removed if it prevents examination or treatment of another eye problem. Surgery removes the cloudy lens and usually replaces it with a substitute lens (intraocular lens, IOL).  At a time when both you and your doctor agree, the cataract will be surgically removed. If you have cataracts in both eyes, only one is usually removed at a time. This allows the operated eye to heal and be out of danger from any possible problems after surgery (such as infection or poor wound healing). In rare cases, a cataract may be doing damage to your eye. In these cases, your caregiver may advise surgical removal right away. The vast majority of people who have cataract surgery have better vision afterward. HOME CARE INSTRUCTIONS  If you are not planning surgery, you may be asked to do the following:  Use different eyeglasses.   Use stronger or brighter lighting.   Ask your eye doctor about reducing your medicine dose  or changing medicines if it is thought that a medicine caused your cataract. Changing medicines does not make the cataract go away on its own.   Become familiar with your surroundings. Poor vision can lead to injury. Avoid bumping into things on the affected side. You are at a higher risk for tripping or falling.   Exercise extreme care when driving or operating machinery.   Wear sunglasses if you are sensitive to bright light or experiencing problems with glare.  SEEK IMMEDIATE MEDICAL CARE IF:   You have a worsening or sudden  vision loss.   You notice redness, swelling, or increasing pain in the eye.   You have a fever.  Document Released: 06/13/2005 Document Revised: 06/02/2011 Document Reviewed: 02/04/2011 [facility] Patient Information 2012 [place], [place].PATIENT INSTRUCTIONS POST-ANESTHESIA  IMMEDIATELY FOLLOWING SURGERY:  Do not drive or operate machinery for the first twenty four hours after surgery.  Do not make any important decisions for twenty four hours after surgery or while taking narcotic pain medications or sedatives.  If you develop intractable nausea and vomiting or a severe headache please notify your doctor immediately.  FOLLOW-UP:  Please make an appointment with your surgeon as instructed. You do not need to follow up with anesthesia unless specifically instructed to do so.  WOUND CARE INSTRUCTIONS (if applicable):  Keep a dry clean dressing on the anesthesia/puncture wound site if there is drainage.  Once the wound has quit draining you may leave it open to air.  Generally you should leave the bandage intact for twenty four hours unless there is drainage.  If the epidural site drains for more than 36-48 hours please call the anesthesia department.  QUESTIONS?:  Please feel free to call your physician or the hospital operator if you have any questions, and they will be happy to assist you.

## 2014-07-18 ENCOUNTER — Inpatient Hospital Stay (HOSPITAL_COMMUNITY): Admission: RE | Admit: 2014-07-18 | Payer: Medicaid Other | Source: Ambulatory Visit

## 2014-07-21 ENCOUNTER — Other Ambulatory Visit: Payer: Self-pay

## 2014-07-21 ENCOUNTER — Encounter (HOSPITAL_COMMUNITY): Payer: Self-pay

## 2014-07-21 ENCOUNTER — Encounter (HOSPITAL_COMMUNITY)
Admission: RE | Admit: 2014-07-21 | Discharge: 2014-07-21 | Disposition: A | Discharge status: Home / Self Care | Payer: BLUE CROSS/BLUE SHIELD | Source: Ambulatory Visit | Attending: Ophthalmology | Admitting: Ophthalmology

## 2014-07-21 DIAGNOSIS — H25812 Combined forms of age-related cataract, left eye: Secondary | ICD-10-CM | POA: Diagnosis present

## 2014-07-21 HISTORY — DX: Anxiety disorder, unspecified: F41.9

## 2014-07-21 LAB — BASIC METABOLIC PANEL
Anion gap: 7 (ref 5–15)
BUN: 11 mg/dL (ref 6–23)
CALCIUM: 9.7 mg/dL (ref 8.4–10.5)
CO2: 29 mmol/L (ref 19–32)
Chloride: 108 mmol/L (ref 96–112)
Creatinine, Ser: 0.61 mg/dL (ref 0.50–1.10)
GFR calc non Af Amer: >90 mL/min (ref >90)
Glucose, Bld: 80 mg/dL (ref 70–99)
Potassium: 4.5 mmol/L (ref 3.5–5.1)
SODIUM: 144 mmol/L (ref 135–145)

## 2014-07-21 LAB — CBC
HEMATOCRIT: 37.9 % (ref 36.0–46.0)
Hemoglobin: 12.4 g/dL (ref 12.0–15.0)
MCH: 31.6 pg (ref 26.0–34.0)
MCHC: 32.7 g/dL (ref 30.0–36.0)
MCV: 96.4 fL (ref 78.0–100.0)
Platelets: 437 10*3/uL — ABNORMAL HIGH (ref 150–400)
RBC: 3.93 MIL/uL (ref 3.87–5.11)
RDW: 13.7 % (ref 11.5–15.5)
WBC: 5.9 10*3/uL (ref 4.0–10.5)

## 2014-07-23 MED ORDER — NEOMYCIN-POLYMYXIN-DEXAMETH 3.5-10000-0.1 OP SUSP
OPHTHALMIC | Status: AC
  Filled 2014-07-23: qty 5

## 2014-07-23 MED ORDER — LIDOCAINE HCL 3.5 % OP GEL
OPHTHALMIC | Status: AC
  Filled 2014-07-23: qty 1

## 2014-07-23 MED ORDER — TETRACAINE HCL 0.5 % OP SOLN
OPHTHALMIC | Status: AC
  Filled 2014-07-23: qty 2

## 2014-07-23 MED ORDER — PHENYLEPHRINE HCL 2.5 % OP SOLN
OPHTHALMIC | Status: AC
  Filled 2014-07-23: qty 15

## 2014-07-23 MED ORDER — CYCLOPENTOLATE-PHENYLEPHRINE OP SOLN OPTIME - NO CHARGE
OPHTHALMIC | Status: AC
  Filled 2014-07-23: qty 2

## 2014-07-23 MED ORDER — LIDOCAINE HCL (PF) 1 % IJ SOLN
INTRAMUSCULAR | Status: AC
  Filled 2014-07-23: qty 2

## 2014-07-24 ENCOUNTER — Encounter (HOSPITAL_COMMUNITY): Payer: Self-pay | Admitting: Anesthesiology

## 2014-07-24 ENCOUNTER — Ambulatory Visit (HOSPITAL_COMMUNITY)
Admission: RE | Admit: 2014-07-24 | Discharge: 2014-07-24 | Disposition: A | Discharge status: Home / Self Care | Payer: BLUE CROSS/BLUE SHIELD | Source: Ambulatory Visit | Attending: Ophthalmology | Admitting: Ophthalmology

## 2014-07-24 ENCOUNTER — Encounter (HOSPITAL_COMMUNITY)
Admission: RE | Disposition: A | Discharge status: Home / Self Care | Payer: Self-pay | Source: Ambulatory Visit | Attending: Ophthalmology

## 2014-07-24 ENCOUNTER — Ambulatory Visit (HOSPITAL_COMMUNITY): Payer: BLUE CROSS/BLUE SHIELD | Admitting: Anesthesiology

## 2014-07-24 DIAGNOSIS — H25812 Combined forms of age-related cataract, left eye: Secondary | ICD-10-CM | POA: Insufficient documentation

## 2014-07-24 HISTORY — PX: CATARACT EXTRACTION W/PHACO: SHX586

## 2014-07-24 SURGERY — PHACOEMULSIFICATION, CATARACT, WITH IOL INSERTION
Anesthesia: Monitor Anesthesia Care | Site: Eye | Laterality: Left

## 2014-07-24 MED ORDER — FENTANYL CITRATE 0.05 MG/ML IJ SOLN
INTRAMUSCULAR | Status: AC
  Filled 2014-07-24: qty 2

## 2014-07-24 MED ORDER — LACTATED RINGERS IV SOLN
INTRAVENOUS | Status: DC
  Administered 2014-07-24: 11:00:00 via INTRAVENOUS

## 2014-07-24 MED ORDER — EPINEPHRINE HCL 1 MG/ML IJ SOLN
INTRAMUSCULAR | Status: AC
  Filled 2014-07-24: qty 1

## 2014-07-24 MED ORDER — LIDOCAINE HCL (PF) 1 % IJ SOLN
INTRAMUSCULAR | Status: DC | PRN
  Administered 2014-07-24: .5 mL

## 2014-07-24 MED ORDER — TETRACAINE HCL 0.5 % OP SOLN
1.0000 [drp] | OPHTHALMIC | Status: AC
  Administered 2014-07-24 (×3): 1 [drp] via OPHTHALMIC

## 2014-07-24 MED ORDER — CYCLOPENTOLATE-PHENYLEPHRINE 0.2-1 % OP SOLN
1.0000 [drp] | OPHTHALMIC | Status: AC
  Administered 2014-07-24 (×3): 1 [drp] via OPHTHALMIC

## 2014-07-24 MED ORDER — PROVISC 10 MG/ML IO SOLN
INTRAOCULAR | Status: DC | PRN
  Administered 2014-07-24: 0.85 mL via INTRAOCULAR

## 2014-07-24 MED ORDER — FENTANYL CITRATE 0.05 MG/ML IJ SOLN: 25.0000 ug | INTRAMUSCULAR | Status: DC | PRN

## 2014-07-24 MED ORDER — PHENYLEPHRINE HCL 2.5 % OP SOLN
1.0000 [drp] | OPHTHALMIC | Status: AC
  Administered 2014-07-24 (×3): 1 [drp] via OPHTHALMIC

## 2014-07-24 MED ORDER — BSS IO SOLN
INTRAOCULAR | Status: DC | PRN
  Administered 2014-07-24: 15 mL via INTRAOCULAR

## 2014-07-24 MED ORDER — MIDAZOLAM HCL 2 MG/2ML IJ SOLN
INTRAMUSCULAR | Status: AC
  Filled 2014-07-24: qty 2

## 2014-07-24 MED ORDER — POVIDONE-IODINE 5 % OP SOLN
OPHTHALMIC | Status: DC | PRN
  Administered 2014-07-24: 1 via OPHTHALMIC

## 2014-07-24 MED ORDER — EPINEPHRINE HCL 1 MG/ML IJ SOLN
INTRAOCULAR | Status: DC | PRN
  Administered 2014-07-24: 11:00:00

## 2014-07-24 MED ORDER — FENTANYL CITRATE 0.05 MG/ML IJ SOLN
25.0000 ug | INTRAMUSCULAR | Status: AC
  Administered 2014-07-24: 10:00:00 via INTRAVENOUS
  Administered 2014-07-24: 25 ug via INTRAVENOUS

## 2014-07-24 MED ORDER — LIDOCAINE 3.5 % OP GEL OPTIME - NO CHARGE
OPHTHALMIC | Status: DC | PRN
  Administered 2014-07-24: 1 [drp] via OPHTHALMIC

## 2014-07-24 MED ORDER — MIDAZOLAM HCL 2 MG/2ML IJ SOLN
1.0000 mg | INTRAMUSCULAR | Status: DC | PRN
  Administered 2014-07-24 (×2): 2 mg via INTRAVENOUS
  Filled 2014-07-24: qty 2

## 2014-07-24 MED ORDER — LIDOCAINE HCL 3.5 % OP GEL
1.0000 "application " | Freq: Once | OPHTHALMIC | Status: AC
  Administered 2014-07-24: 1 via OPHTHALMIC

## 2014-07-24 MED ORDER — ONDANSETRON HCL 4 MG/2ML IJ SOLN: 4.0000 mg | Freq: Once | INTRAMUSCULAR | Status: DC | PRN

## 2014-07-24 SURGICAL SUPPLY — 13 items
CLOTH BEACON ORANGE TIMEOUT ST (SAFETY) ×2 IMPLANT
EYE SHIELD UNIVERSAL CLEAR (GAUZE/BANDAGES/DRESSINGS) ×2 IMPLANT
GLOVE BIOGEL PI IND STRL 7.0 (GLOVE) IMPLANT
GLOVE BIOGEL PI INDICATOR 7.0 (GLOVE) ×2
GLOVE EXAM NITRILE LRG STRL (GLOVE) ×2 IMPLANT
LENS ALC ACRYL/TECN (Ophthalmic Related) ×2 IMPLANT
PAD ARMBOARD 7.5X6 YLW CONV (MISCELLANEOUS) ×2 IMPLANT
PROC W SPEC LENS (INTRAOCULAR LENS) ×3
PROCESS W SPEC LENS (INTRAOCULAR LENS) IMPLANT
SYRINGE LUER LOK 1CC (MISCELLANEOUS) ×2 IMPLANT
TAPE SURG TRANSPORE 1 IN (GAUZE/BANDAGES/DRESSINGS) IMPLANT
TAPE SURGICAL TRANSPORE 1 IN (GAUZE/BANDAGES/DRESSINGS) ×2
WATER STERILE IRR 250ML POUR (IV SOLUTION) ×2 IMPLANT

## 2014-07-24 NOTE — Discharge Instructions (Signed)

## 2014-07-24 NOTE — Transfer of Care (Signed)
Immediate Anesthesia Transfer of Care Note  Patient: Melody Johnson  Procedure(s) Performed: Procedure(s) with comments: CATARACT EXTRACTION PHACO AND INTRAOCULAR LENS PLACEMENT LEFT (Left) - CDE:1.82  Patient Location: Short Stay  Anesthesia Type:MAC  Level of Consciousness: awake  Airway & Oxygen Therapy: Patient Spontanous Breathing  Post-op Assessment: Report given to PACU RN  Post vital signs: Reviewed  Last Vitals:  Filed Vitals:   07/24/14 1040  BP: 130/89  Pulse:   Temp:   Resp: 23    Complications: No apparent anesthesia complications

## 2014-07-24 NOTE — H&P (Signed)
I have reviewed the H&P, the patient was re-examined, and I have identified no interval changes in medical condition and plan of care since the history and physical of record  

## 2014-07-24 NOTE — Anesthesia Preprocedure Evaluation (Signed)
Anesthesia Evaluation  Patient identified by MRN, date of birth, ID band Patient awake    History of Anesthesia Complications (+) PONV and history of anesthetic complications  Airway Mallampati: II  TM Distance: >3 FB     Dental  (+) Teeth Intact   Pulmonary sleep apnea ,  breath sounds clear to auscultation        Cardiovascular Rhythm:Regular Rate:Normal     Neuro/Psych PSYCHIATRIC DISORDERS Anxiety Depression    GI/Hepatic Gastric bypass    Endo/Other    Renal/GU      Musculoskeletal  (+) Arthritis -,   Abdominal   Peds  Hematology   Anesthesia Other Findings   Reproductive/Obstetrics                             Anesthesia Physical Anesthesia Plan  ASA: II  Anesthesia Plan: MAC   Post-op Pain Management:    Induction: Intravenous  Airway Management Planned: Nasal Cannula  Additional Equipment:   Intra-op Plan:   Post-operative Plan:   Informed Consent: I have reviewed the patients History and Physical, chart, labs and discussed the procedure including the risks, benefits and alternatives for the proposed anesthesia with the patient or authorized representative who has indicated his/her understanding and acceptance.     Plan Discussed with:   Anesthesia Plan Comments:         Anesthesia Quick Evaluation

## 2014-07-24 NOTE — Anesthesia Postprocedure Evaluation (Signed)
  Anesthesia Post-op Note  Patient: Melody Johnson  Procedure(s) Performed: Procedure(s) with comments: CATARACT EXTRACTION PHACO AND INTRAOCULAR LENS PLACEMENT LEFT (Left) - CDE:1.82  Patient Location: Short Stay  Anesthesia Type:MAC  Level of Consciousness: awake, alert  and oriented  Airway and Oxygen Therapy: Patient Spontanous Breathing  Post-op Pain: none  Post-op Assessment: Post-op Vital signs reviewed, Patient's Cardiovascular Status Stable, Respiratory Function Stable, Patent Airway and No signs of Nausea or vomiting  Post-op Vital Signs: Reviewed and stable  Last Vitals:  Filed Vitals:   07/24/14 1040  BP: 130/89  Pulse:   Temp:   Resp: 23    Complications: No apparent anesthesia complications

## 2014-07-24 NOTE — Op Note (Signed)
Date of Admission: 07/24/2014  Date of Surgery: 07/24/2014  Pre-Op Dx: Cataract Left  Eye  Post-Op Dx: Senile Combined Cataract  Left  Eye,  Dx Code Q46.962H25.812  Surgeon: Gemma PayorKerry Mckinnley Cottier, M.D.  Assistants: None  Anesthesia: Topical with MAC  Indications: Painless, progressive loss of vision with compromise of daily activities.  Surgery: Cataract Extraction with Intraocular lens Implant Left Eye  Discription: The patient had dilating drops and viscous lidocaine placed into the Left eye in the pre-op holding area. After transfer to the operating room, a time out was performed. The patient was then prepped and draped. Beginning with a 75 degree blade a paracentesis port was made at the surgeon's 2 o'clock position. The anterior chamber was then filled with 1% non-preserved lidocaine. This was followed by filling the anterior chamber with Provisc.  A 2.604mm keratome blade was used to make a clear corneal incision at the temporal limbus.  A bent cystatome needle was used to create a continuous tear capsulotomy. Hydrodissection was performed with balanced salt solution on a Fine canula. The lens nucleus was then removed using the phacoemulsification handpiece. Residual cortex was removed with the I&A handpiece. The anterior chamber and capsular bag were refilled with Provisc. A posterior chamber intraocular lens was placed into the capsular bag with it's injector. The implant was positioned with the Kuglan hook. The Provisc was then removed from the anterior chamber and capsular bag with the I&A handpiece. Stromal hydration of the main incision and paracentesis port was performed with BSS on a Fine canula. The wounds were tested for leak which was negative. The patient tolerated the procedure well. There were no operative complications. The patient was then transferred to the recovery room in stable condition.  Complications: None  Specimen: None  EBL: None  Prosthetic device: Alcon AcrySof [id number], power  17.5 D, SN [id number].

## 2014-07-25 ENCOUNTER — Encounter (HOSPITAL_COMMUNITY): Payer: Self-pay | Admitting: Ophthalmology

## 2014-07-30 ENCOUNTER — Encounter (HOSPITAL_COMMUNITY)
Admission: RE | Admit: 2014-07-30 | Discharge: 2014-07-30 | Disposition: A | Discharge status: Home / Self Care | Payer: BLUE CROSS/BLUE SHIELD | Source: Ambulatory Visit | Attending: Ophthalmology | Admitting: Ophthalmology

## 2014-08-01 MED ORDER — LIDOCAINE HCL (PF) 1 % IJ SOLN
INTRAMUSCULAR | Status: AC
  Filled 2014-08-01: qty 2

## 2014-08-01 MED ORDER — CYCLOPENTOLATE-PHENYLEPHRINE OP SOLN OPTIME - NO CHARGE
OPHTHALMIC | Status: AC
  Filled 2014-08-01: qty 2

## 2014-08-01 MED ORDER — PHENYLEPHRINE HCL 2.5 % OP SOLN
OPHTHALMIC | Status: AC
  Filled 2014-08-01: qty 15

## 2014-08-01 MED ORDER — TETRACAINE HCL 0.5 % OP SOLN
OPHTHALMIC | Status: AC
  Filled 2014-08-01: qty 2

## 2014-08-01 MED ORDER — NEOMYCIN-POLYMYXIN-DEXAMETH 3.5-10000-0.1 OP SUSP
OPHTHALMIC | Status: AC
  Filled 2014-08-01: qty 5

## 2014-08-01 MED ORDER — LIDOCAINE HCL 3.5 % OP GEL
OPHTHALMIC | Status: AC
  Filled 2014-08-01: qty 1

## 2014-08-04 ENCOUNTER — Ambulatory Visit (HOSPITAL_COMMUNITY): Payer: BLUE CROSS/BLUE SHIELD | Admitting: Anesthesiology

## 2014-08-04 ENCOUNTER — Ambulatory Visit (HOSPITAL_COMMUNITY)
Admission: RE | Admit: 2014-08-04 | Discharge: 2014-08-04 | Disposition: A | Discharge status: Home / Self Care | Payer: BLUE CROSS/BLUE SHIELD | Source: Ambulatory Visit | Attending: Ophthalmology | Admitting: Ophthalmology

## 2014-08-04 ENCOUNTER — Encounter (HOSPITAL_COMMUNITY): Payer: Self-pay | Admitting: *Deleted

## 2014-08-04 ENCOUNTER — Encounter (HOSPITAL_COMMUNITY)
Admission: RE | Disposition: A | Discharge status: Home / Self Care | Payer: Self-pay | Source: Ambulatory Visit | Attending: Ophthalmology

## 2014-08-04 DIAGNOSIS — H52201 Unspecified astigmatism, right eye: Secondary | ICD-10-CM | POA: Insufficient documentation

## 2014-08-04 DIAGNOSIS — H25811 Combined forms of age-related cataract, right eye: Secondary | ICD-10-CM | POA: Insufficient documentation

## 2014-08-04 HISTORY — PX: CATARACT EXTRACTION W/PHACO: SHX586

## 2014-08-04 SURGERY — PHACOEMULSIFICATION, CATARACT, WITH IOL INSERTION
Anesthesia: Monitor Anesthesia Care | Site: Eye | Laterality: Right

## 2014-08-04 MED ORDER — ONDANSETRON HCL 4 MG/2ML IJ SOLN
INTRAMUSCULAR | Status: AC
  Filled 2014-08-04: qty 2

## 2014-08-04 MED ORDER — LACTATED RINGERS IV SOLN
INTRAVENOUS | Status: DC
  Administered 2014-08-04: 08:00:00 via INTRAVENOUS

## 2014-08-04 MED ORDER — FENTANYL CITRATE 0.05 MG/ML IJ SOLN
INTRAMUSCULAR | Status: AC
  Filled 2014-08-04: qty 2

## 2014-08-04 MED ORDER — FENTANYL CITRATE 0.05 MG/ML IJ SOLN
25.0000 ug | INTRAMUSCULAR | Status: AC
  Administered 2014-08-04 (×2): 25 ug via INTRAVENOUS

## 2014-08-04 MED ORDER — PHENYLEPHRINE HCL 2.5 % OP SOLN
1.0000 [drp] | OPHTHALMIC | Status: AC
  Administered 2014-08-04 (×3): 1 [drp] via OPHTHALMIC

## 2014-08-04 MED ORDER — MIDAZOLAM HCL 2 MG/2ML IJ SOLN
1.0000 mg | INTRAMUSCULAR | Status: DC | PRN
Start: 2014-08-04 — End: 2014-08-04
  Administered 2014-08-04: 2 mg via INTRAVENOUS

## 2014-08-04 MED ORDER — LIDOCAINE HCL (PF) 1 % IJ SOLN
INTRAMUSCULAR | Status: DC | PRN
  Administered 2014-08-04: .4 mL

## 2014-08-04 MED ORDER — TETRACAINE HCL 0.5 % OP SOLN
1.0000 [drp] | OPHTHALMIC | Status: AC | PRN
  Administered 2014-08-04 (×3): 1 [drp] via OPHTHALMIC

## 2014-08-04 MED ORDER — BSS IO SOLN
INTRAOCULAR | Status: DC | PRN
  Administered 2014-08-04: 15 mL

## 2014-08-04 MED ORDER — CYCLOPENTOLATE-PHENYLEPHRINE 0.2-1 % OP SOLN
1.0000 [drp] | OPHTHALMIC | Status: AC
  Administered 2014-08-04 (×3): 1 [drp] via OPHTHALMIC

## 2014-08-04 MED ORDER — EPINEPHRINE HCL 1 MG/ML IJ SOLN
INTRAMUSCULAR | Status: AC
  Filled 2014-08-04: qty 1

## 2014-08-04 MED ORDER — POVIDONE-IODINE 5 % OP SOLN
OPHTHALMIC | Status: DC | PRN
  Administered 2014-08-04: 1 via OPHTHALMIC

## 2014-08-04 MED ORDER — MIDAZOLAM HCL 2 MG/2ML IJ SOLN
INTRAMUSCULAR | Status: AC
  Filled 2014-08-04: qty 2

## 2014-08-04 MED ORDER — ONDANSETRON HCL 4 MG/2ML IJ SOLN
4.0000 mg | Freq: Once | INTRAMUSCULAR | Status: AC
  Administered 2014-08-04: 4 mg via INTRAVENOUS

## 2014-08-04 MED ORDER — EPINEPHRINE HCL 1 MG/ML IJ SOLN
INTRAOCULAR | Status: DC | PRN
  Administered 2014-08-04: 500 mL

## 2014-08-04 MED ORDER — LIDOCAINE HCL 3.5 % OP GEL: 1.0000 "application " | Freq: Once | OPHTHALMIC | Status: DC

## 2014-08-04 MED ORDER — PROVISC 10 MG/ML IO SOLN
INTRAOCULAR | Status: DC | PRN
  Administered 2014-08-04: 0.85 mL via INTRAOCULAR

## 2014-08-04 SURGICAL SUPPLY — 13 items
CLOTH BEACON ORANGE TIMEOUT ST (SAFETY) ×2 IMPLANT
EYE SHIELD UNIVERSAL CLEAR (GAUZE/BANDAGES/DRESSINGS) ×2 IMPLANT
GLOVE BIOGEL PI IND STRL 7.0 (GLOVE) IMPLANT
GLOVE BIOGEL PI INDICATOR 7.0 (GLOVE) ×2
GLOVE EXAM NITRILE MD LF STRL (GLOVE) ×2 IMPLANT
LENS IOL ACRYSOF IQ TORIC 15.0 ×2 IMPLANT
PAD ARMBOARD 7.5X6 YLW CONV (MISCELLANEOUS) ×2 IMPLANT
PROC W SPEC LENS (INTRAOCULAR LENS) ×3
PROCESS W SPEC LENS (INTRAOCULAR LENS) IMPLANT
SYRINGE LUER LOK 1CC (MISCELLANEOUS) ×2 IMPLANT
TAPE SURG TRANSPORE 1 IN (GAUZE/BANDAGES/DRESSINGS) IMPLANT
TAPE SURGICAL TRANSPORE 1 IN (GAUZE/BANDAGES/DRESSINGS) ×2
WATER STERILE IRR 250ML POUR (IV SOLUTION) ×2 IMPLANT

## 2014-08-04 NOTE — Transfer of Care (Signed)
Immediate Anesthesia Transfer of Care Note  Patient: Melody Johnson  Procedure(s) Performed: Procedure(s): CATARACT EXTRACTION PHACO AND INTRAOCULAR LENS PLACEMENT RIGHT EYE CDE=1.23 (Right)  Patient Location: Short Stay  Anesthesia Type:MAC  Level of Consciousness: awake, alert , oriented and patient cooperative  Airway & Oxygen Therapy: Patient Spontanous Breathing  Post-op Assessment: Report given to RN, Post -op Vital signs reviewed and stable and Patient moving all extremities  Post vital signs: Reviewed and stable  Last Vitals:  Filed Vitals:   08/04/14 0810  BP: 114/78  Pulse:   Temp:   Resp: 27    Complications: No apparent anesthesia complications

## 2014-08-04 NOTE — Op Note (Signed)
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  Assistants: None  Anesthesia: Topical with MAC  Indications: Painless, progressive loss of vision with compromise of daily activities.  Surgery: Cataract Extraction with Intraocular lens Implant Right Eye  Discription: The patient had dilating drops and viscous lidocaine placed into the Right in the pre-op holding area. In the sitting position horizontal reference marks were made on the cornea.  After transfer to the operating room, a time out was performed. The patient was then prepped and draped. Beginning with a 75 degree blade a paracentesis port was made at the surgeon's 2 o'clock position. The anterior chamber was then filled with 1% non-preserved lidocaine. This was followed by filling the anterior chamber with Provisc. A 2.184mm keratome blade was used to make a clear cornea incision at the temporal limbus. A bent cystatome needle was used to create a continuous tear capsulotomy. Hydrodissection was performed with balanced salt solution on a Fine canula. The lens nucleus was then removed using the phacoemulsification handpiece. Residual cortex was removed with the I&A handpiece. The anterior chamber and capsular bag were refilled with Provisc. A posterior chamber intraocular lens was placed into the capsular bag with it's injector. Additional corneal marks were made on the 113/293 degree meridians.  The Provisc was then removed from the anterior chamber and capsular bag with the I&A handpiece. The implant was positioned with the Kuglan hook. Stromal hydration of the main incision and paracentesis port was performed with BSS on a Fine canula. The wounds were tested for leak which was negative. The patient tolerated the procedure well. There were no operative complications. The  patient was then transferred to the recovery room in stable condition.  Complications: None  Specimen: None  EBL: None  Prosthetic device: Alcon AcrySof Toric, [id number], power 19.5SE, SN [id number].

## 2014-08-04 NOTE — H&P (Signed)
I have reviewed the H&P, the patient was re-examined, and I have identified no interval changes in medical condition and plan of care since the history and physical of record  

## 2014-08-04 NOTE — Anesthesia Postprocedure Evaluation (Signed)
  Anesthesia Post-op Note  Patient: Melody Johnson  Procedure(s) Performed: Procedure(s): CATARACT EXTRACTION PHACO AND INTRAOCULAR LENS PLACEMENT RIGHT EYE CDE=1.23 (Right)  Patient Location: Short Stay  Anesthesia Type:MAC  Level of Consciousness: awake, alert , oriented and patient cooperative  Airway and Oxygen Therapy: Patient Spontanous Breathing  Post-op Pain: none  Post-op Assessment: Post-op Vital signs reviewed, Patient's Cardiovascular Status Stable, Respiratory Function Stable, Patent Airway and No headache  Post-op Vital Signs: Reviewed and stable  Last Vitals:  Filed Vitals:   08/04/14 0810  BP: 114/78  Pulse:   Temp:   Resp: 27    Complications: No apparent anesthesia complications

## 2014-08-04 NOTE — Discharge Instructions (Signed)

## 2014-08-04 NOTE — Anesthesia Preprocedure Evaluation (Signed)
Anesthesia Evaluation  Patient identified by MRN, date of birth, ID band Patient awake    History of Anesthesia Complications (+) PONV and history of anesthetic complications  Airway Mallampati: II  TM Distance: >3 FB     Dental  (+) Teeth Intact   Pulmonary sleep apnea ,  breath sounds clear to auscultation        Cardiovascular hypertension, Rhythm:Regular Rate:Normal     Neuro/Psych PSYCHIATRIC DISORDERS Anxiety Depression    GI/Hepatic Gastric bypass    Endo/Other  diabetes  Renal/GU      Musculoskeletal  (+) Arthritis -,   Abdominal   Peds  Hematology   Anesthesia Other Findings   Reproductive/Obstetrics                             Anesthesia Physical Anesthesia Plan  ASA: II  Anesthesia Plan: MAC   Post-op Pain Management:    Induction: Intravenous  Airway Management Planned: Nasal Cannula  Additional Equipment:   Intra-op Plan:   Post-operative Plan:   Informed Consent: I have reviewed the patients History and Physical, chart, labs and discussed the procedure including the risks, benefits and alternatives for the proposed anesthesia with the patient or authorized representative who has indicated his/her understanding and acceptance.     Plan Discussed with:   Anesthesia Plan Comments:         Anesthesia Quick Evaluation

## 2014-08-05 ENCOUNTER — Encounter (HOSPITAL_COMMUNITY): Payer: Self-pay | Admitting: Ophthalmology

## 2014-12-16 ENCOUNTER — Other Ambulatory Visit: Payer: Self-pay | Admitting: Adult Health

## 2014-12-24 ENCOUNTER — Ambulatory Visit (INDEPENDENT_AMBULATORY_CARE_PROVIDER_SITE_OTHER): Payer: BLUE CROSS/BLUE SHIELD | Admitting: Adult Health

## 2014-12-24 ENCOUNTER — Encounter: Payer: Self-pay | Admitting: Adult Health

## 2014-12-24 VITALS — BP 134/82 | HR 80 | Ht 63.0 in | Wt 146.0 lb

## 2014-12-24 DIAGNOSIS — Z01419 Encounter for gynecological examination (general) (routine) without abnormal findings: Secondary | ICD-10-CM | POA: Diagnosis not present

## 2014-12-24 DIAGNOSIS — Z1212 Encounter for screening for malignant neoplasm of rectum: Secondary | ICD-10-CM | POA: Diagnosis not present

## 2014-12-24 DIAGNOSIS — R61 Generalized hyperhidrosis: Secondary | ICD-10-CM | POA: Insufficient documentation

## 2014-12-24 DIAGNOSIS — R232 Flushing: Secondary | ICD-10-CM

## 2014-12-24 HISTORY — DX: Flushing: R23.2

## 2014-12-24 HISTORY — DX: Generalized hyperhidrosis: R61

## 2014-12-24 LAB — HEMOCCULT GUIAC POC 1CARD (OFFICE): FECAL OCCULT BLD: NEGATIVE

## 2014-12-24 NOTE — Progress Notes (Signed)
Patient ID: Melody Johnson, female   DOB: 10/07/1965, 49 y.o.   MRN: [id number] History of Present Illness: Melody Johnson is a 49 year old white female, married in for well woman gyn exam.She had a normal pap with negative HPV 09/12/12.She is complaining of night sweats and hot flashes, she has not had a period in 6-7 years. [facility].  Current Medications, Allergies, Past Medical History, Past Surgical History, Family History and Social History were reviewed in [facility] electronic medical record.     Review of Systems:  Patient denies any headaches, hearing loss, fatigue, blurred vision, shortness of breath, chest pain, abdominal pain, problems with bowel movements, urination(may leak some), or intercourse(some discomfort at times). No joint pain or mood swings.See HPI for positives. She had gastric by pass in 2013.  Physical Exam:BP 134/82 mmHg  Pulse 80  Ht 5\' 3"  (1.6 m)  Wt 146 lb (66.225 kg)  BMI 25.87 kg/m2 General:  Well developed, well nourished, no acute distress Skin:  Warm and dry Neck:  Midline trachea, normal thyroid, good ROM, no lymphadenopathy Lungs; Clear to auscultation bilaterally Breast:  No dominant palpable mass, retraction, or nipple discharge Cardiovascular: Regular rate and rhythm Abdomen:  Soft, non tender, no hepatosplenomegaly Pelvic:  External genitalia is normal in appearance, no lesions.  The vagina is normal in appearance. Urethra has no lesions or masses. The cervix is smooth.  Uterus is felt to be normal size, shape, and contour.  No adnexal masses or tenderness noted.Bladder is non tender, no masses felt. Rectal: Good sphincter tone, no polyps, or hemorrhoids felt.  Hemoccult negative. Extremities/musculoskeletal:  No swelling or varicosities noted, no clubbing or cyanosis Psych:  No mood changes, alert and cooperative,seems happy Discussed some options, like HRT, low dose OC or brisdelle.  Impression: Well woman gyn exam no pap Hot  flashes and night sweats  Plan: Check CBC,CMP,TSH and FSH and B12, will talk when labs back about options Pap and physical in 1 year Mammogram yearly Review handout on menopause

## 2014-12-24 NOTE — Patient Instructions (Signed)
Menopause Menopause is the normal time of life when menstrual periods stop completely. Menopause is complete when you have missed 12 consecutive menstrual periods. It usually occurs between the ages of 48 years and 55 years. Very rarely does a woman develop menopause before the age of 40 years. At menopause, your ovaries stop producing the female hormones estrogen and progesterone. This can cause undesirable symptoms and also affect your health. Sometimes the symptoms may occur 4-5 years before the menopause begins. There is no relationship between menopause and:  Oral contraceptives.  Number of children you had.  Race.  The age your menstrual periods started (menarche). Heavy smokers and very thin women may develop menopause earlier in life. CAUSES  The ovaries stop producing the female hormones estrogen and progesterone.  Other causes include:  Surgery to remove both ovaries.  The ovaries stop functioning for no known reason.  Tumors of the pituitary gland in the brain.  Medical disease that affects the ovaries and hormone production.  Radiation treatment to the abdomen or pelvis.  Chemotherapy that affects the ovaries. SYMPTOMS   Hot flashes.  Night sweats.  Decrease in sex drive.  Vaginal dryness and thinning of the vagina causing painful intercourse.  Dryness of the skin and developing wrinkles.  Headaches.  Tiredness.  Irritability.  Memory problems.  Weight gain.  Bladder infections.  Hair growth of the face and chest.  Infertility. More serious symptoms include:  Loss of bone (osteoporosis) causing breaks (fractures).  Depression.  Hardening and narrowing of the arteries (atherosclerosis) causing heart attacks and strokes. DIAGNOSIS   When the menstrual periods have stopped for 12 straight months.  Physical exam.  Hormone studies of the blood. TREATMENT  There are many treatment choices and nearly as many questions about them. The  decisions to treat or not to treat menopausal changes is an individual choice made with your health care provider. Your health care provider can discuss the treatments with you. Together, you can decide which treatment will work best for you. Your treatment choices may include:   Hormone therapy (estrogen and progesterone).  Non-hormonal medicines.  Treating the individual symptoms with medicine (for example antidepressants for depression).  Herbal medicines that may help specific symptoms.  Counseling by a psychiatrist or psychologist.  Group therapy.  Lifestyle changes including:  Eating healthy.  Regular exercise.  Limiting caffeine and alcohol.  Stress management and meditation.  No treatment. HOME CARE INSTRUCTIONS   Take the medicine your health care provider gives you as directed.  Get plenty of sleep and rest.  Exercise regularly.  Eat a diet that contains calcium (good for the bones) and soy products (acts like estrogen hormone).  Avoid alcoholic beverages.  Do not smoke.  If you have hot flashes, dress in layers.  Take supplements, calcium, and vitamin D to strengthen bones.  You can use over-the-counter lubricants or moisturizers for vaginal dryness.  Group therapy is sometimes very helpful.  Acupuncture may be helpful in some cases. SEEK MEDICAL CARE IF:   You are not sure you are in menopause.  You are having menopausal symptoms and need advice and treatment.  You are still having menstrual periods after age 55 years.  You have pain with intercourse.  Menopause is complete (no menstrual period for 12 months) and you develop vaginal bleeding.  You need a referral to a specialist (gynecologist, psychiatrist, or psychologist) for treatment. SEEK IMMEDIATE MEDICAL CARE IF:   You have severe depression.  You have excessive vaginal bleeding.    You fell and think you have a broken bone.  You have pain when you urinate.  You develop leg or  chest pain.  You have a fast pounding heart beat (palpitations).  You have severe headaches.  You develop vision problems.  You feel a lump in your breast.  You have abdominal pain or severe indigestion. Document Released: 09/03/2003 Document Revised: 02/13/2013 Document Reviewed: 01/10/2013 [facility] Patient Information 2015 [employer] [place] [place]. This information is not intended to replace advice given to you by your health care provider. Make sure you discuss any questions you have with your health care provider. Pap and physical in 1 year Mammogram yearly

## 2014-12-25 LAB — COMPREHENSIVE METABOLIC PANEL
A/G RATIO: 1.7 (ref 1.1–2.5)
ALT: 44 IU/L — ABNORMAL HIGH (ref 0–32)
AST: 32 IU/L (ref 0–40)
Albumin: 4.5 g/dL (ref 3.5–5.5)
Alkaline Phosphatase: 98 IU/L (ref 39–117)
BUN/Creatinine Ratio: 23 (ref 9–23)
BUN: 16 mg/dL (ref 6–24)
Bilirubin Total: 0.3 mg/dL (ref 0.0–1.2)
CHLORIDE: 102 mmol/L (ref 97–108)
CO2: 23 mmol/L (ref 18–29)
Calcium: 10.4 mg/dL — ABNORMAL HIGH (ref 8.7–10.2)
Creatinine, Ser: 0.7 mg/dL (ref 0.57–1.00)
GFR calc Af Amer: 119 mL/min/{1.73_m2} (ref >59)
GFR calc non Af Amer: 104 mL/min/{1.73_m2} (ref >59)
Globulin, Total: 2.6 g/dL (ref 1.5–4.5)
Glucose: 91 mg/dL (ref 65–99)
Potassium: 4.2 mmol/L (ref 3.5–5.2)
Sodium: 143 mmol/L (ref 134–144)
TOTAL PROTEIN: 7.1 g/dL (ref 6.0–8.5)

## 2014-12-25 LAB — TSH: TSH: 0.906 u[IU]/mL (ref 0.450–4.500)

## 2014-12-25 LAB — CBC
Hematocrit: 39.9 % (ref 34.0–46.6)
Hemoglobin: 13.6 g/dL (ref 11.1–15.9)
MCH: 32.8 pg (ref 26.6–33.0)
MCHC: 34.1 g/dL (ref 31.5–35.7)
MCV: 96 fL (ref 79–97)
Platelets: 465 10*3/uL — ABNORMAL HIGH (ref 150–379)
RBC: 4.15 x10E6/uL (ref 3.77–5.28)
RDW: 13.7 % (ref 12.3–15.4)
WBC: 6.6 10*3/uL (ref 3.4–10.8)

## 2014-12-25 LAB — FOLLICLE STIMULATING HORMONE: FSH: 25.1 m[IU]/mL

## 2014-12-25 LAB — VITAMIN B12: VITAMIN B 12: 677 pg/mL (ref 211–946)

## 2014-12-30 ENCOUNTER — Telehealth: Payer: Self-pay | Admitting: Adult Health

## 2014-12-30 NOTE — Telephone Encounter (Signed)
Pt aware of labs, FSH 25.1, has not had period in years, will get US to assess

## 2015-01-01 ENCOUNTER — Other Ambulatory Visit: Payer: Self-pay | Admitting: Adult Health

## 2015-01-01 DIAGNOSIS — N912 Amenorrhea, unspecified: Secondary | ICD-10-CM

## 2015-01-05 ENCOUNTER — Ambulatory Visit (INDEPENDENT_AMBULATORY_CARE_PROVIDER_SITE_OTHER): Payer: BLUE CROSS/BLUE SHIELD

## 2015-01-05 DIAGNOSIS — N912 Amenorrhea, unspecified: Secondary | ICD-10-CM | POA: Diagnosis not present

## 2015-01-05 NOTE — Progress Notes (Signed)
US PELVIC TA/TV normal anteverted uterus,normal ov's bilat, eec .208mm,no free fluid,no pain during ultrasound

## 2015-01-07 ENCOUNTER — Telehealth: Payer: Self-pay | Admitting: Adult Health

## 2015-01-07 NOTE — Telephone Encounter (Signed)
Pt aware that US was normal, and if she wants to try HRT let me know

## 2015-01-14 ENCOUNTER — Telehealth: Payer: Self-pay | Admitting: Adult Health

## 2015-01-14 MED ORDER — ESTRADIOL-NORETHINDRONE ACET 0.05-0.14 MG/DAY TD PTTW: 1.0000 | MEDICATED_PATCH | TRANSDERMAL | Status: DC

## 2015-01-14 NOTE — Telephone Encounter (Signed)
Pt wants to try HRT, will Rx Combipatch, stop estroven, pt aware of risk/benefits and could start period

## 2015-03-27 ENCOUNTER — Other Ambulatory Visit: Payer: Self-pay | Admitting: Obstetrics and Gynecology

## 2015-03-27 DIAGNOSIS — Z1231 Encounter for screening mammogram for malignant neoplasm of breast: Secondary | ICD-10-CM

## 2015-04-27 ENCOUNTER — Ambulatory Visit (HOSPITAL_COMMUNITY)
Admission: RE | Admit: 2015-04-27 | Discharge: 2015-04-27 | Disposition: A | Discharge status: Home / Self Care | Payer: BLUE CROSS/BLUE SHIELD | Source: Ambulatory Visit | Attending: Obstetrics and Gynecology | Admitting: Obstetrics and Gynecology

## 2015-04-27 DIAGNOSIS — Z1231 Encounter for screening mammogram for malignant neoplasm of breast: Secondary | ICD-10-CM | POA: Insufficient documentation

## 2015-04-27 DIAGNOSIS — R928 Other abnormal and inconclusive findings on diagnostic imaging of breast: Secondary | ICD-10-CM | POA: Insufficient documentation

## 2015-04-29 ENCOUNTER — Other Ambulatory Visit: Payer: Self-pay | Admitting: Obstetrics and Gynecology

## 2015-04-29 DIAGNOSIS — R928 Other abnormal and inconclusive findings on diagnostic imaging of breast: Secondary | ICD-10-CM

## 2015-05-12 ENCOUNTER — Encounter (HOSPITAL_COMMUNITY): Payer: BLUE CROSS/BLUE SHIELD

## 2015-05-15 ENCOUNTER — Other Ambulatory Visit: Payer: Self-pay | Admitting: Neurology

## 2015-05-15 MED ORDER — MONTELUKAST SODIUM 10 MG PO TABS: 10.0000 mg | ORAL_TABLET | Freq: Every day | ORAL | Status: DC

## 2015-05-26 ENCOUNTER — Other Ambulatory Visit: Payer: Self-pay | Admitting: Obstetrics and Gynecology

## 2015-05-26 ENCOUNTER — Ambulatory Visit (HOSPITAL_COMMUNITY)
Admission: RE | Admit: 2015-05-26 | Discharge: 2015-05-26 | Disposition: A | Discharge status: Home / Self Care | Payer: BLUE CROSS/BLUE SHIELD | Source: Ambulatory Visit | Attending: Obstetrics and Gynecology | Admitting: Obstetrics and Gynecology

## 2015-05-26 ENCOUNTER — Ambulatory Visit (HOSPITAL_COMMUNITY)
Admission: RE | Admit: 2015-05-26 | Discharge: 2015-05-26 | Disposition: A | Discharge status: Home / Self Care | Payer: BLUE CROSS/BLUE SHIELD | Source: Ambulatory Visit | Attending: Radiology | Admitting: Radiology

## 2015-05-26 DIAGNOSIS — R928 Other abnormal and inconclusive findings on diagnostic imaging of breast: Secondary | ICD-10-CM

## 2015-05-26 DIAGNOSIS — N6001 Solitary cyst of right breast: Secondary | ICD-10-CM | POA: Insufficient documentation

## 2015-05-26 DIAGNOSIS — N6310 Unspecified lump in the right breast, unspecified quadrant: Secondary | ICD-10-CM

## 2015-05-27 ENCOUNTER — Telehealth: Payer: Self-pay | Admitting: Obstetrics and Gynecology

## 2015-05-27 NOTE — Telephone Encounter (Signed)
Call of support to patient .

## 2015-06-02 ENCOUNTER — Ambulatory Visit (HOSPITAL_COMMUNITY)
Admission: RE | Admit: 2015-06-02 | Discharge: 2015-06-02 | Disposition: A | Discharge status: Home / Self Care | Payer: BLUE CROSS/BLUE SHIELD | Source: Ambulatory Visit | Attending: Internal Medicine | Admitting: Internal Medicine

## 2015-06-02 ENCOUNTER — Encounter (HOSPITAL_COMMUNITY): Payer: Self-pay

## 2015-06-02 ENCOUNTER — Other Ambulatory Visit (HOSPITAL_COMMUNITY): Payer: Self-pay | Admitting: Internal Medicine

## 2015-06-02 ENCOUNTER — Ambulatory Visit (HOSPITAL_COMMUNITY)
Admission: RE | Admit: 2015-06-02 | Discharge: 2015-06-02 | Disposition: A | Discharge status: Home / Self Care | Payer: BLUE CROSS/BLUE SHIELD | Source: Ambulatory Visit | Attending: Radiology | Admitting: Radiology

## 2015-06-02 ENCOUNTER — Other Ambulatory Visit (HOSPITAL_COMMUNITY): Payer: BLUE CROSS/BLUE SHIELD | Admitting: Internal Medicine

## 2015-06-02 DIAGNOSIS — N63 Unspecified lump in breast: Secondary | ICD-10-CM | POA: Diagnosis present

## 2015-06-02 DIAGNOSIS — N6310 Unspecified lump in the right breast, unspecified quadrant: Secondary | ICD-10-CM

## 2015-06-02 DIAGNOSIS — N6081 Other benign mammary dysplasias of right breast: Secondary | ICD-10-CM | POA: Insufficient documentation

## 2015-06-02 DIAGNOSIS — R928 Other abnormal and inconclusive findings on diagnostic imaging of breast: Secondary | ICD-10-CM

## 2015-06-02 HISTORY — PX: BREAST BIOPSY: SHX20

## 2015-06-02 MED ORDER — LIDOCAINE HCL (PF) 1 % IJ SOLN
INTRAMUSCULAR | Status: AC
  Filled 2015-06-02: qty 10

## 2015-06-02 MED ORDER — SODIUM BICARBONATE 4 % IV SOLN
INTRAVENOUS | Status: AC
  Filled 2015-06-02: qty 5

## 2015-07-03 ENCOUNTER — Ambulatory Visit: Payer: BLUE CROSS/BLUE SHIELD | Admitting: Allergy and Immunology

## 2015-07-07 ENCOUNTER — Ambulatory Visit: Payer: BLUE CROSS/BLUE SHIELD | Admitting: Allergy and Immunology

## 2015-07-14 ENCOUNTER — Ambulatory Visit: Payer: BLUE CROSS/BLUE SHIELD | Admitting: Allergy and Immunology

## 2015-07-21 ENCOUNTER — Ambulatory Visit (INDEPENDENT_AMBULATORY_CARE_PROVIDER_SITE_OTHER): Payer: BLUE CROSS/BLUE SHIELD | Admitting: Allergy and Immunology

## 2015-07-21 ENCOUNTER — Encounter: Payer: Self-pay | Admitting: Allergy and Immunology

## 2015-07-21 VITALS — BP 116/68 | HR 72 | Temp 97.8°F | Resp 18

## 2015-07-21 DIAGNOSIS — R05 Cough: Secondary | ICD-10-CM

## 2015-07-21 DIAGNOSIS — R059 Cough, unspecified: Secondary | ICD-10-CM

## 2015-07-21 NOTE — Progress Notes (Signed)
     FOLLOW UP NOTE  RE: Melody Johnson MRN: [id number] DOB: 05/19/1966  [facility] [place] [address] [place], [place] [address] Date of Office Visit: 07/21/2015  Subjective:  Melody Johnson is a 50 y.o. female who presents today for Medication Management  Assessment:  No diagnosis found. Plan:  No orders of the defined types were placed in this encounter.   There are no Patient Instructions on file for this visit.   HPI: Melody Johnson returns to the office regarding mixed rhinitis and cough.  Since her last visit in July she describes feeling very good and is pleased with this medication regime.  She feels medications are beneficial and prefers to maintain on them.  She denies any wheeze, difficulty breathing or shortness of breath.  There is a rare episode of throat irritation, especially in the morning, though nothing persisting or even daily.  No fever, headache, sore throat, discolored drainage or concerns with the fluctuant weather patterns.  She typically does not snore.  Denies ED or urgent care visits, prednisone or antibiotic courses. Reports sleep and activity are normal.  No new medical issues.  Melody Johnson has a current medication list which includes the following prescription(s): alprazolam, beclomethasone dipropionate, calcium citrate-vitamin d, cetirizine, citalopram hydrobromide, mirtazapine, montelukast, multivitamin, omeprazole, valacyclovir, and estradiol-norethindrone.   Drug Allergies: Allergies  Allergen Reactions  . Bacitracin-Polymyxin B Other (See Comments)    Will actually cause infection instead of healing the area it has been applied to.  . Codeine Nausea Only  . Neomycin-Bacitracin Zn-Polymyx Other (See Comments)    Will actually cause infection instead of healing the area it has been applied to.  . Penicillins Rash    Starts at feet and works its way up the body.   Marland Kitchen Roxicet [Oxycodone-Acetaminophen] Itching  . Sulfonamide Derivatives       REACTION: UNKNOWN REACTION   Objective:   Filed Vitals:   07/21/15 1626  BP: 116/68  Pulse: 72  Temp: 97.8 F (36.6 C)  Resp: 18   SpO2 Readings from Last 1 Encounters:  07/21/15 98%  ]Physical Exam  Constitutional: She is well-developed, well-nourished, and in no distress.  HENT:  Head: Atraumatic.  Right Ear: Tympanic membrane and ear canal normal.  Left Ear: Tympanic membrane and ear canal normal.  Nose: Mucosal edema present. No rhinorrhea. No epistaxis.  Mouth/Throat: Oropharynx is clear and moist and mucous membranes are normal. No oropharyngeal exudate, posterior oropharyngeal edema or posterior oropharyngeal erythema.  Neck: Neck supple.  Cardiovascular: Normal rate, S1 normal and S2 normal.   No murmur heard. Pulmonary/Chest: Effort normal. She has no wheezes. She has no rhonchi. She has no rales.  Lymphadenopathy:    She has no cervical adenopathy.   Diagnostics: Spirometry:  FVC  3.50--115%, FEV1 2.78--107%.    Theopolis Sloop M. Willa Rough, MD  cc: Cassell Smiles., MD

## 2015-07-21 NOTE — Patient Instructions (Signed)
Continue current medications. ----Qnasl, Zyrtec, Singulair.   Saline nasal wash 2-4 times daily.   Call with additional questions or concerns.   Follow-up in 6-9 months.

## 2015-08-17 ENCOUNTER — Telehealth: Payer: Self-pay | Admitting: Adult Health

## 2015-08-17 MED ORDER — NORETHINDRONE-ETH ESTRADIOL 0.5-2.5 MG-MCG PO TABS: 1.0000 | ORAL_TABLET | Freq: Every day | ORAL | Status: DC

## 2015-08-17 NOTE — Telephone Encounter (Signed)
Spoke with pt. Pt has been on Combi patch but her pharmacy hasn't been able to get it since December. Plus, her insurance is not covering it now. Pt is requesting a different patch or pill. Thanks!! JSY

## 2015-08-17 NOTE — Telephone Encounter (Signed)
Will rx Femhrt

## 2015-09-22 ENCOUNTER — Other Ambulatory Visit: Payer: Self-pay

## 2015-09-22 MED ORDER — MONTELUKAST SODIUM 10 MG PO TABS: 10.0000 mg | ORAL_TABLET | Freq: Every day | ORAL | Status: DC

## 2015-10-08 ENCOUNTER — Ambulatory Visit (HOSPITAL_COMMUNITY)
Admission: RE | Admit: 2015-10-08 | Discharge: 2015-10-08 | Disposition: A | Discharge status: Home / Self Care | Payer: BLUE CROSS/BLUE SHIELD | Source: Ambulatory Visit | Attending: Registered Nurse | Admitting: Registered Nurse

## 2015-10-08 ENCOUNTER — Other Ambulatory Visit (HOSPITAL_COMMUNITY): Payer: Self-pay | Admitting: Registered Nurse

## 2015-10-08 DIAGNOSIS — Z1389 Encounter for screening for other disorder: Secondary | ICD-10-CM

## 2015-10-08 DIAGNOSIS — M7989 Other specified soft tissue disorders: Secondary | ICD-10-CM | POA: Insufficient documentation

## 2015-10-13 ENCOUNTER — Encounter: Payer: Self-pay | Admitting: Orthopaedic Surgery

## 2015-10-13 ENCOUNTER — Ambulatory Visit (INDEPENDENT_AMBULATORY_CARE_PROVIDER_SITE_OTHER): Payer: BLUE CROSS/BLUE SHIELD | Admitting: Orthopaedic Surgery

## 2015-10-13 VITALS — BP 145/95 | HR 77 | Ht 63.0 in | Wt 160.0 lb

## 2015-10-13 DIAGNOSIS — S92002A Unspecified fracture of left calcaneus, initial encounter for closed fracture: Secondary | ICD-10-CM

## 2015-10-13 NOTE — Progress Notes (Signed)
Subjective: I hurt my left foot and heel    Patient ID: Melody Johnson, female    DOB: 03/14/1966, 50 y.o.   MRN: [id number]  Foot Pain This is a new problem. The current episode started in the past 7 days. The problem occurs daily. The problem has been gradually improving. Associated symptoms include joint swelling. Pertinent negatives include no chest pain, congestion or coughing. The symptoms are aggravated by walking. She has tried ice, acetaminophen and rest for the symptoms. The treatment provided moderate relief.   She fell and hurt her left foot and left ankle on 10-08-15.  X-rays were done on 10-09-15.  I have reviewed the reports and the x-rays.  She has a time bony density of the lateral aspect of the calcaneus on the left consistent with avulsion fracture.  She has swelling here.  She has bruising as well.  She has no crutches. I have given Rx for this.  I have also given instructions for Contrast Baths.  She has no other injury.   Review of Systems  HENT: Negative for congestion.   Respiratory: Negative for cough and shortness of breath.   Cardiovascular: Negative for chest pain and leg swelling.  Endocrine: Negative for cold intolerance.  Musculoskeletal: Positive for joint swelling and gait problem.  Allergic/Immunologic: Positive for environmental allergies.   Past Medical History  Diagnosis Date  . High cholesterol   . Arthritis   . GERD (gastroesophageal reflux disease)   . Depression   . PONV (postoperative nausea and vomiting)   . Hypertension     off meds after gastric bypass  . Obstructive sleep apnea     not using CPAP due to gastric bypass- not used for iver 2 yrs  . Diabetes mellitus without complication (HCC)     off meds after gastric bypass  . Anxiety   . Hot flashes 12/24/2014  . Night sweats 12/24/2014    Past Surgical History  Procedure Laterality Date  . Cervical spine surgery  2000  . Lasik  1996  . Lithotripsy  05/2011  . Cholecystectomy  2002   . 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 Cataract extraction w/phaco Left 07/24/2014    Procedure: CATARACT EXTRACTION PHACO AND INTRAOCULAR LENS PLACEMENT LEFT;  Surgeon: Gemma PayorKerry Hunt, MD;  Location: [facility];  Service: Ophthalmology;  Laterality: Left;  CDE:1.82  . Cataract extraction w/phaco Right 08/04/2014    Procedure: CATARACT EXTRACTION PHACO AND INTRAOCULAR LENS PLACEMENT RIGHT EYE CDE=1.23;  Surgeon: Gemma PayorKerry Hunt, MD;  Location: [facility];  Service: Ophthalmology;  Laterality: Right;    Current Outpatient Prescriptions on File Prior to Visit  Medication Sig Dispense Refill  . ALPRAZolam (XANAX) 0.5 MG tablet Take 0.5 mg by mouth 3 (three) times daily as needed for anxiety.    . Beclomethasone Dipropionate (QNASL) 80 MCG/ACT AERS Place into the nose.    . Calcium Citrate-Vitamin D (CITRACAL PETITES/VITAMIN D) 200-250 MG-UNIT TABS Take 2 tablets by mouth daily.     . cetirizine (ZYRTEC) 10 MG tablet Take 10 mg by mouth every morning.     . Citalopram Hydrobromide (CELEXA PO) Take by mouth.    . mirtazapine (REMERON) 30 MG tablet Take 30 mg by mouth at bedtime.    . montelukast (SINGULAIR) 10 MG tablet Take 1 tablet (10 mg total) by mouth at bedtime. 30 tablet  5  . Multiple Vitamin (MULTIVITAMIN) tablet Take by mouth daily. Freedavite Multi Vit    . norethindrone-ethinyl estradiol (FEMHRT LOW DOSE) 0.5-2.5 MG-MCG tablet Take 1 tablet by mouth daily. 30 tablet 3  . omeprazole (PRILOSEC) 20 MG capsule Take 20 mg by mouth every morning.     . valACYclovir (VALTREX) 500 MG tablet Take 500 mg by mouth every morning.      No current facility-administered medications on file prior to visit.    Social History   Social History  . Marital Status: Married    Spouse Name: N/A  . Number of Children: N/A  . Years of Education: N/A   Occupational  History  . Not on file.   Social History Main Topics  . Smoking status: Never Smoker   . Smokeless tobacco: Never Used  . Alcohol Use: Yes     Comment: occ  . Drug Use: No  . Sexual Activity: Yes    Birth Control/ Protection: None, Post-menopausal   Other Topics Concern  . Not on file   Social History Narrative    BP 145/95 mmHg  Pulse 77  Ht  (1.6 m)  Wt 160 lb (72.576 kg)  BMI 28.35 kg/m2     Objective:   Physical Exam  Constitutional: She is oriented to person, place, and time. She appears well-developed and well-nourished.  HENT:  Head: Normocephalic and atraumatic.  Eyes: Conjunctivae and EOM are normal. Pupils are equal, round, and reactive to light.  Neck: Normal range of motion. Neck supple.  Cardiovascular: Normal rate, regular rhythm and intact distal pulses.   Pulmonary/Chest: Effort normal.  Abdominal: Soft.  Musculoskeletal: She exhibits tenderness (She is tender over the lateral left ankle and heel area.  She has lateral swelling of the ankle with ecchymosis.  ROM of the toes and ankle is full.  NV is intact.).       Left foot: There is tenderness.       Feet:  Neurological: She is alert and oriented to person, place, and time. She displays normal reflexes. No cranial nerve deficit. She exhibits normal muscle tone. Coordination normal.  Skin: Skin is warm and dry.  Psychiatric: She has a normal mood and affect. Her behavior is normal. Judgment and thought content normal.   Rx for crutches given.     Assessment & Plan:   Encounter Diagnosis  Name Primary?  . Fracture of left calcaneus, closed, initial encounter Yes   Do the contrast baths.  Elevate,  Ice.  Call if any problem.  Return in one week.

## 2015-10-13 NOTE — Patient Instructions (Addendum)
Do contrast bath, elevate and ice your foot to reduce swelling. Crutch or cane on opposite side will help Wear boot

## 2015-10-20 ENCOUNTER — Ambulatory Visit (INDEPENDENT_AMBULATORY_CARE_PROVIDER_SITE_OTHER): Payer: BLUE CROSS/BLUE SHIELD

## 2015-10-20 ENCOUNTER — Ambulatory Visit: Payer: BLUE CROSS/BLUE SHIELD | Admitting: Orthopaedic Surgery

## 2015-10-20 VITALS — BP 147/92 | HR 79 | Temp 98.1°F | Ht 63.0 in | Wt 160.0 lb

## 2015-10-20 DIAGNOSIS — S92902D Unspecified fracture of left foot, subsequent encounter for fracture with routine healing: Secondary | ICD-10-CM

## 2015-10-20 NOTE — Progress Notes (Signed)
Patient ID: Melody Johnson, female   DOB: 07/27/1965, 50 y.o.   MRN: [id number]  CC:  My left foot is doing well  She has no pain in the left lateral foot.  She has some resolving swelling.  She is using her CAM walker.  NV is intact.  Impression:  Healing left lateral distal calcaneus avulsion fracture  Plan:  Gradually come out of the CAM walker.  Return in one month.  X-rays on return.

## 2015-10-20 NOTE — Patient Instructions (Signed)
X-rays on return. 

## 2015-11-09 ENCOUNTER — Other Ambulatory Visit: Payer: Self-pay | Admitting: Adult Health

## 2015-11-17 ENCOUNTER — Ambulatory Visit: Payer: BLUE CROSS/BLUE SHIELD | Admitting: Orthopaedic Surgery

## 2015-11-25 ENCOUNTER — Ambulatory Visit (INDEPENDENT_AMBULATORY_CARE_PROVIDER_SITE_OTHER): Payer: BLUE CROSS/BLUE SHIELD | Admitting: Orthopaedic Surgery

## 2015-11-25 ENCOUNTER — Ambulatory Visit (INDEPENDENT_AMBULATORY_CARE_PROVIDER_SITE_OTHER): Payer: BLUE CROSS/BLUE SHIELD

## 2015-11-25 ENCOUNTER — Encounter: Payer: Self-pay | Admitting: Orthopaedic Surgery

## 2015-11-25 VITALS — BP 136/90 | HR 81 | Temp 98.1°F | Ht 63.5 in | Wt 162.0 lb

## 2015-11-25 DIAGNOSIS — S92902D Unspecified fracture of left foot, subsequent encounter for fracture with routine healing: Secondary | ICD-10-CM

## 2015-11-25 NOTE — Progress Notes (Signed)
CC:  My foot is doing OK  She has some slight discomfort of the left foot after a lot of walking, otherwise doing well.  NV intact.  Normal gait.  X-rays were done, recorded separately.   Encounter Diagnosis  Name Primary?  Marland Kitchen. Foot fracture, left, with routine healing, subsequent encounter Yes    I will see her as needed.  Call if any problem.

## 2015-12-25 ENCOUNTER — Ambulatory Visit (INDEPENDENT_AMBULATORY_CARE_PROVIDER_SITE_OTHER): Payer: BLUE CROSS/BLUE SHIELD | Admitting: Adult Health

## 2015-12-25 ENCOUNTER — Encounter: Payer: Self-pay | Admitting: Adult Health

## 2015-12-25 ENCOUNTER — Other Ambulatory Visit (HOSPITAL_COMMUNITY)
Admission: RE | Admit: 2015-12-25 | Discharge: 2015-12-25 | Disposition: A | Discharge status: Home / Self Care | Payer: BLUE CROSS/BLUE SHIELD | Source: Ambulatory Visit | Attending: Adult Health | Admitting: Adult Health

## 2015-12-25 VITALS — BP 134/82 | HR 90 | Ht 63.0 in | Wt 162.0 lb

## 2015-12-25 DIAGNOSIS — Z1212 Encounter for screening for malignant neoplasm of rectum: Secondary | ICD-10-CM | POA: Diagnosis not present

## 2015-12-25 DIAGNOSIS — Z01419 Encounter for gynecological examination (general) (routine) without abnormal findings: Secondary | ICD-10-CM | POA: Insufficient documentation

## 2015-12-25 DIAGNOSIS — Z79890 Hormone replacement therapy (postmenopausal): Secondary | ICD-10-CM | POA: Insufficient documentation

## 2015-12-25 DIAGNOSIS — N950 Postmenopausal bleeding: Secondary | ICD-10-CM

## 2015-12-25 DIAGNOSIS — Z1151 Encounter for screening for human papillomavirus (HPV): Secondary | ICD-10-CM | POA: Insufficient documentation

## 2015-12-25 DIAGNOSIS — R232 Flushing: Secondary | ICD-10-CM

## 2015-12-25 HISTORY — DX: Hormone replacement therapy: Z79.890

## 2015-12-25 HISTORY — DX: Postmenopausal bleeding: N95.0

## 2015-12-25 LAB — HEMOCCULT GUIAC POC 1CARD (OFFICE): FECAL OCCULT BLD: NEGATIVE

## 2015-12-25 MED ORDER — ESTRADIOL-NORETHINDRONE ACET 0.05-0.14 MG/DAY TD PTTW: 1.0000 | MEDICATED_PATCH | TRANSDERMAL | Status: DC

## 2015-12-25 NOTE — Progress Notes (Signed)
Patient ID: Melody Johnson, female   DOB: 02/27/1966, 50 y.o.   MRN: [id number] History of Present Illness:  Melody Johnson is a 50 year old white female in for a well woman gyn exam and pap.She says she is having hot flashes on femhrt and did not on the patch and has decreased libido and had a period 10/18/15 and had not had one in years, had normal US 2016.  Current Medications, Allergies, Past Medical History, Past Surgical History, Family History and Social History were reviewed in [facility] electronic medical record.     Review of Systems: Patient denies any headaches, hearing loss, fatigue, blurred vision, shortness of breath, chest pain, abdominal pain, problems with bowel movements, urination, or intercourse. No joint pain or mood swings.See HPI for positives.     Physical Exam:BP 134/82 mmHg  Pulse 90  Ht 5\' 3"  (1.6 m)  Wt 162 lb (73.483 kg)  BMI 28.70 kg/m2  LMP 10/18/2015 General:  Well developed, well nourished, no acute distress Skin:  Warm and dry Neck:  Midline trachea, normal thyroid, good ROM, no lymphadenopathy Lungs; Clear to auscultation bilaterally Breast:  No dominant palpable mass, retraction, or nipple discharge Cardiovascular: Regular rate and rhythm Abdomen:  Soft, non tender, no hepatosplenomegaly Pelvic:  External genitalia is normal in appearance, no lesions.  The vagina is normal in appearance. Urethra has no lesions or masses. The cervix is smooth, pap with HPV performed. Uterus is felt to be normal size, shape, and contour.  No adnexal masses or tenderness noted.Bladder is non tender, no masses felt. Rectal: Good sphincter tone, no polyps, or hemorrhoids felt.  Hemoccult negative. Extremities/musculoskeletal:  No swelling or varicosities noted, no clubbing or cyanosis Psych:  No mood changes, alert and cooperative,seems happy Will get US to assess uterus and will go back to patch.  Impression: Well woman gyn exam and pap PMB HRT Hot flashes      Plan: Stop femhrt Rx combipatch 0.05-0.14 #8 use 1 bi weekly with 12 refills Return in 1 week for GYN US Physical in 1 year,pap in 3 if normal Mammogram yearly Colonoscopy at 51

## 2015-12-25 NOTE — Patient Instructions (Signed)
Physical in 1 year, pap in 3 if normal Mammogram yearly Colonoscopy at 51 Return in 1 week for US Postmenopausal Bleeding Postmenopausal bleeding is any bleeding a woman has after she has entered into menopause. Menopause is the end of a woman's fertile years. After menopause, a woman no longer ovulates or has menstrual periods.  Postmenopausal bleeding can be caused by various things. Any type of postmenopausal bleeding, even if it appears to be a typical menstrual period, is concerning. This should be evaluated by your health care provider. Any treatment will depend on the cause of the bleeding. HOME CARE INSTRUCTIONS Monitor your condition for any changes. The following actions may help to alleviate any discomfort you are experiencing:  Avoid the use of tampons and douches as directed by your health care provider.  Change your pads frequently.  Get regular pelvic exams and Pap tests.  Keep all follow-up appointments for diagnostic tests as directed by your health care provider. SEEK MEDICAL CARE IF:   Your bleeding lasts more than 1 week.  You have abdominal pain.  You have bleeding with sexual intercourse. SEEK IMMEDIATE MEDICAL CARE IF:   You have a fever, chills, headache, dizziness, muscle aches, and bleeding.  You have severe pain with bleeding.  You are passing blood clots.  You have bleeding and need more than 1 pad an hour.  You feel faint. MAKE SURE YOU:  Understand these instructions.  Will watch your condition.  Will get help right away if you are not doing well or get worse.   This information is not intended to replace advice given to you by your health care provider. Make sure you discuss any questions you have with your health care provider.   Document Released: 09/21/2005 Document Revised: 04/03/2013 Document Reviewed: 01/10/2013 Elsevier Interactive Patient Education [employer]2016 Elsevier Inc.

## 2015-12-30 ENCOUNTER — Telehealth: Payer: Self-pay | Admitting: Adult Health

## 2015-12-30 LAB — CYTOLOGY - PAP

## 2015-12-30 NOTE — Telephone Encounter (Signed)
Pt aware insurance will not pay for combipatch till failed 2 other meds, will stay on femhrt til after US then will try duavee and see if works

## 2016-01-01 ENCOUNTER — Ambulatory Visit (INDEPENDENT_AMBULATORY_CARE_PROVIDER_SITE_OTHER): Payer: BLUE CROSS/BLUE SHIELD

## 2016-01-01 ENCOUNTER — Other Ambulatory Visit: Payer: Self-pay | Admitting: Adult Health

## 2016-01-01 DIAGNOSIS — N854 Malposition of uterus: Secondary | ICD-10-CM

## 2016-01-01 DIAGNOSIS — N950 Postmenopausal bleeding: Secondary | ICD-10-CM | POA: Diagnosis not present

## 2016-01-01 MED ORDER — CONJ ESTROGENS-BAZEDOXIFENE 0.45-20 MG PO TABS: ORAL_TABLET | ORAL | Status: DC

## 2016-01-01 NOTE — Progress Notes (Signed)
US PELVIC US TA/TV: Homogenous anteverted uterus,EEC 1.7 mm,normal ov's bilat (mobile),no free fluid,no pain during ultrasound

## 2016-01-13 ENCOUNTER — Other Ambulatory Visit: Payer: Self-pay | Admitting: *Deleted

## 2016-01-14 ENCOUNTER — Other Ambulatory Visit: Payer: Self-pay

## 2016-01-14 ENCOUNTER — Other Ambulatory Visit: Payer: Self-pay | Admitting: Allergy and Immunology

## 2016-01-14 MED ORDER — BECLOMETHASONE DIPROPIONATE 80 MCG/ACT NA AERS: 1.0000 | INHALATION_SPRAY | Freq: Two times a day (BID) | NASAL | Status: DC

## 2016-01-14 NOTE — Telephone Encounter (Signed)
Pt called and needs to have qnasl refilled at [facility]. [phone].

## 2016-01-14 NOTE — Telephone Encounter (Signed)
Sent script into [facility].

## 2016-01-21 ENCOUNTER — Telehealth: Payer: Self-pay | Admitting: Adult Health

## 2016-01-21 MED ORDER — ESTRADIOL-NORETHINDRONE ACET 0.05-0.14 MG/DAY TD PTTW: 1.0000 | MEDICATED_PATCH | TRANSDERMAL | 12 refills | Status: DC

## 2016-01-21 NOTE — Telephone Encounter (Signed)
Pt has failed on femhrt and duavee but did well on comb ipatch will rx combi patch again

## 2016-01-21 NOTE — Telephone Encounter (Signed)
Spoke with pt. Pt was given samples of Duavee. Pt says it's ok but she would rather have patch. Thanks!! JSY

## 2016-01-21 NOTE — Telephone Encounter (Signed)
Pt called stating that Victorino Dike given her samples of a hormone medication to try out. Pt states that she prefers the patch. Please contact pt

## 2016-01-27 ENCOUNTER — Telehealth: Payer: Self-pay | Admitting: *Deleted

## 2016-01-27 NOTE — Telephone Encounter (Signed)
Pt says she will check with drug to see if insurance covered combi patch first

## 2016-01-28 ENCOUNTER — Telehealth: Payer: Self-pay | Admitting: Adult Health

## 2016-01-28 NOTE — Telephone Encounter (Signed)
Pt called stating that her and Victorino Dike spoke and they were going to call in a hormone medication into the pharmacy. Pt states that she has called them and they state they didn't receive anything. Please contact pt

## 2016-01-28 NOTE — Telephone Encounter (Signed)
Pt said combi patch not at drug store, was sent 7.27 will call [facility] and give verbal

## 2016-02-02 ENCOUNTER — Encounter: Payer: Self-pay | Admitting: Allergy & Immunology

## 2016-02-02 ENCOUNTER — Ambulatory Visit (INDEPENDENT_AMBULATORY_CARE_PROVIDER_SITE_OTHER): Payer: BLUE CROSS/BLUE SHIELD | Admitting: Allergy & Immunology

## 2016-02-02 VITALS — BP 120/75 | HR 65 | Temp 98.4°F | Resp 16

## 2016-02-02 DIAGNOSIS — J310 Chronic rhinitis: Secondary | ICD-10-CM | POA: Diagnosis not present

## 2016-02-02 MED ORDER — BECLOMETHASONE DIPROPIONATE 40 MCG/ACT NA AERS: 1.0000 | INHALATION_SPRAY | Freq: Every day | NASAL | 11 refills | Status: DC

## 2016-02-02 MED ORDER — MONTELUKAST SODIUM 10 MG PO TABS: 10.0000 mg | ORAL_TABLET | Freq: Every day | ORAL | 11 refills | Status: DC

## 2016-02-02 NOTE — Patient Instructions (Signed)
1. Chronic rhinitis - Continue all current medications.  - I think we can see you once per year.  - Call us if there are issues!   It was a pleasure to meet you today!

## 2016-02-02 NOTE — Progress Notes (Signed)
FOLLOW UP  Date of Service/Encounter:  02/02/16   Assessment:   Chronic rhinitis    Plan/Recommendations:     1. Chronic rhinitis - Continue all current medications. - No indication for any more medications at this time.  - I think we can see you once per year.  - Call us if there are issues!    Subjective:   Kenlie Seki is a 50 y.o. female presenting today for follow up of  Chief Complaint  Patient presents with  . Allergic Rhinitis   .  Elma Wojciak has a history of the following: Patient Active Problem List   Diagnosis Date Noted  . PMB (postmenopausal bleeding) 12/25/2015  . Postmenopausal HRT (hormone replacement therapy) 12/25/2015  . Hot flashes 12/24/2014  . Night sweats 12/24/2014  . NAUSEA AND VOMITING 10/21/2009  . OTHER DYSPHAGIA 10/21/2009  . ANXIETY 03/06/2008  . OBSESSIVE-COMPULSIVE DISORDER 03/06/2008  . DEPRESSION 03/06/2008  . HYPERTENSION 03/06/2008  . ESOPHAGITIS, REFLUX 03/06/2008  . GERD 03/06/2008  . GASTRITIS, ANTRAL 03/06/2008  . HIATAL HERNIA 03/06/2008  . IRRITABLE BOWEL SYNDROME 03/06/2008  . ROSACEA 03/06/2008  . DIARRHEA 03/06/2008  . ALLERGY 03/06/2008    History obtained from: chart review and patient.  Destanie Laski was referred by Cassell Smiles., MD.     Lilyonna is a 50 y.o. female presenting for a follow up visit for allergic rhinitis. The patient was last seen in January 2017 by Dr. Willa Rough, who has since left our practice. At that time, she was doing well on her medications and was maintained without changes. Currently, she is on 2 nasal 80 g 1 spray in each nostril twice a day, cetirizine 10 mg daily, and Singulair 10 mg daily.  She reports that she has done well since the last visit. She is doing well on her Qnasal one spray per nostril daily, cetirizine  daily as well as Singulair. She has been on this regimen for two years. Her worst times of the year are fall and spring. She does have an  intermittent cough that is good as long as her allergic rhinitis symptoms are controlled. There is discussion about a diagnosis of asthma in the last visit, however her breathing test was normal and cough improved once her nasal symptoms were treated. Otherwise, there have been no changes to the past medical history, surgical history, family history, or social history. Immunizations are up to date. She does receive an annual flu shot. 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 There are several pets at home including cats, dogs, and fish.   Review of Systems: a 14-point review of systems is pertinent for what is mentioned in HPI.  Otherwise, all other systems were negative. Constitutional: negative other than that listed in the HPI Eyes: negative other than that listed in the HPI Ears, nose, mouth, throat, and face: negative other than that listed in the HPI Respiratory: negative other than that listed in the HPI Cardiovascular: negative other than that listed in the HPI Gastrointestinal: negative other than that listed in the HPI Genitourinary: negative other than that listed in the HPI Integument: negative other than that listed in the HPI Hematologic: negative other than that listed in the HPI Musculoskeletal: negative other than that listed in the HPI Neurological: negative other than that listed in the HPI Allergy/Immunologic: negative other than that listed in the HPI    Objective:   Blood pressure 120/75, pulse 65, temperature 98.4 F (36.9  C), temperature source Oral, resp. rate 16, last menstrual period 10/18/2015, SpO2 94 %. There is no height or weight on file to calculate BMI.   Physical Exam:  General: Alert, interactive, in no acute distress. Cooperative female, personable HEENT: TMs pearly gray, turbinates edematous without discharge, post-pharynx unremarkable. Neck: Supple without lymphadenopathy. Lungs: Clear to auscultation  without wheezing, rhonchi or rales. No crackles or increased work of breathing. CV: Normal S1, S2 without murmurs. Capillary refill within normal limits. Abdomen: Nondistended, nontender. Skin:Warm and dry, without lesions or rashes. Extremities: No clubbing, cyanosis or edema. Neuro: Grossly intact. No focal deficits.   Diagnostic studies: None   Malachi BondsJoel Karo Rog, MD [id number]

## 2016-02-04 ENCOUNTER — Telehealth: Payer: Self-pay | Admitting: *Deleted

## 2016-02-04 MED ORDER — ESTRADIOL 1 MG PO TABS: 1.0000 mg | ORAL_TABLET | Freq: Every day | ORAL | 11 refills | Status: DC

## 2016-02-04 MED ORDER — PROGESTERONE MICRONIZED 200 MG PO CAPS: ORAL_CAPSULE | ORAL | 11 refills | Status: DC

## 2016-02-04 NOTE — Telephone Encounter (Signed)
Will rx estrace and prometrium,since insurance will not pay for combi patch

## 2016-02-04 NOTE — Telephone Encounter (Signed)
Spoke with pt letting her know her insurance is not going to cover Combipatch. I spoke with JAG and she advised the other option is to try another generic hormone replacement, estrogen tabs and progesterone pill. Pt does want to try this. Thanks!! JSY

## 2016-04-27 ENCOUNTER — Other Ambulatory Visit: Payer: Self-pay | Admitting: Obstetrics and Gynecology

## 2016-04-27 DIAGNOSIS — Z1231 Encounter for screening mammogram for malignant neoplasm of breast: Secondary | ICD-10-CM

## 2016-06-03 ENCOUNTER — Ambulatory Visit (HOSPITAL_COMMUNITY)
Admission: RE | Admit: 2016-06-03 | Discharge: 2016-06-03 | Disposition: A | Discharge status: Home / Self Care | Payer: BLUE CROSS/BLUE SHIELD | Source: Ambulatory Visit | Attending: Obstetrics and Gynecology | Admitting: Obstetrics and Gynecology

## 2016-06-03 DIAGNOSIS — Z1231 Encounter for screening mammogram for malignant neoplasm of breast: Secondary | ICD-10-CM | POA: Insufficient documentation

## 2016-09-22 ENCOUNTER — Other Ambulatory Visit: Payer: Self-pay

## 2016-09-22 MED ORDER — BECLOMETHASONE DIPROPIONATE 40 MCG/ACT NA AERS: 1.0000 | INHALATION_SPRAY | Freq: Every day | NASAL | 5 refills | Status: DC

## 2016-09-26 ENCOUNTER — Other Ambulatory Visit: Payer: Self-pay

## 2017-01-10 ENCOUNTER — Other Ambulatory Visit: Payer: Self-pay | Admitting: Adult Health

## 2017-02-10 ENCOUNTER — Other Ambulatory Visit: Payer: Self-pay | Admitting: Allergy & Immunology

## 2017-02-10 NOTE — Telephone Encounter (Signed)
I gave 1 refill for Montelukast with no extra refills. Patient was last seen 01/2016. Patient needs office visit for further refills.

## 2017-03-14 ENCOUNTER — Other Ambulatory Visit: Payer: Self-pay | Admitting: Allergy & Immunology

## 2017-04-14 ENCOUNTER — Other Ambulatory Visit: Payer: Self-pay | Admitting: Allergy & Immunology

## 2017-04-14 ENCOUNTER — Other Ambulatory Visit: Payer: Self-pay | Admitting: Adult Health

## 2017-04-20 ENCOUNTER — Other Ambulatory Visit: Payer: Self-pay | Admitting: Obstetrics and Gynecology

## 2017-04-20 DIAGNOSIS — Z1231 Encounter for screening mammogram for malignant neoplasm of breast: Secondary | ICD-10-CM

## 2017-06-05 ENCOUNTER — Ambulatory Visit (HOSPITAL_COMMUNITY): Payer: BLUE CROSS/BLUE SHIELD

## 2017-06-13 ENCOUNTER — Other Ambulatory Visit: Payer: Self-pay | Admitting: Allergy & Immunology

## 2017-06-13 NOTE — Telephone Encounter (Signed)
Received fax for an alternative medication due to Qnasl 40 not being covered in the patients formulary. Patient was last seen 02/02/2016. Patient was to return in one year. Patient needs office visit.

## 2017-06-28 ENCOUNTER — Encounter: Payer: Self-pay | Admitting: Gastroenterology

## 2017-08-07 ENCOUNTER — Ambulatory Visit (HOSPITAL_COMMUNITY)
Admission: RE | Admit: 2017-08-07 | Discharge: 2017-08-07 | Disposition: A | Discharge status: Home / Self Care | Payer: 59 | Source: Ambulatory Visit | Attending: Obstetrics and Gynecology | Admitting: Obstetrics and Gynecology

## 2017-08-07 DIAGNOSIS — Z1231 Encounter for screening mammogram for malignant neoplasm of breast: Secondary | ICD-10-CM | POA: Insufficient documentation

## 2017-09-18 ENCOUNTER — Other Ambulatory Visit: Payer: Self-pay | Admitting: Obstetrics & Gynecology

## 2017-12-07 ENCOUNTER — Other Ambulatory Visit: Payer: Self-pay

## 2017-12-07 ENCOUNTER — Encounter (HOSPITAL_COMMUNITY): Payer: Self-pay | Admitting: Emergency Medicine

## 2017-12-07 DIAGNOSIS — M545 Low back pain: Secondary | ICD-10-CM | POA: Insufficient documentation

## 2017-12-07 DIAGNOSIS — I10 Essential (primary) hypertension: Secondary | ICD-10-CM | POA: Diagnosis not present

## 2017-12-07 DIAGNOSIS — E119 Type 2 diabetes mellitus without complications: Secondary | ICD-10-CM | POA: Diagnosis not present

## 2017-12-07 DIAGNOSIS — M62838 Other muscle spasm: Secondary | ICD-10-CM | POA: Diagnosis not present

## 2017-12-07 DIAGNOSIS — Z79899 Other long term (current) drug therapy: Secondary | ICD-10-CM | POA: Insufficient documentation

## 2017-12-07 NOTE — ED Triage Notes (Signed)
Pt C/O lower back pain that started Friday last week. Pt states she saw her PCP today and was prescribed tizanidine and meloxicam. Pt states she has no relief.

## 2017-12-08 ENCOUNTER — Emergency Department (HOSPITAL_COMMUNITY)
Admission: EM | Admit: 2017-12-08 | Discharge: 2017-12-08 | Disposition: A | Discharge status: Home / Self Care | Payer: BLUE CROSS/BLUE SHIELD | Attending: Emergency Medicine | Admitting: Emergency Medicine

## 2017-12-08 ENCOUNTER — Emergency Department (HOSPITAL_COMMUNITY): Payer: BLUE CROSS/BLUE SHIELD

## 2017-12-08 DIAGNOSIS — M62838 Other muscle spasm: Secondary | ICD-10-CM

## 2017-12-08 DIAGNOSIS — M545 Low back pain, unspecified: Secondary | ICD-10-CM

## 2017-12-08 LAB — CBC
HEMATOCRIT: 42.1 % (ref 36.0–46.0)
HEMOGLOBIN: 13.8 g/dL (ref 12.0–15.0)
MCH: 32 pg (ref 26.0–34.0)
MCHC: 32.8 g/dL (ref 30.0–36.0)
MCV: 97.7 fL (ref 78.0–100.0)
Platelets: 413 10*3/uL — ABNORMAL HIGH (ref 150–400)
RBC: 4.31 MIL/uL (ref 3.87–5.11)
RDW: 13.3 % (ref 11.5–15.5)
WBC: 8.4 10*3/uL (ref 4.0–10.5)

## 2017-12-08 LAB — BASIC METABOLIC PANEL
ANION GAP: 9 (ref 5–15)
BUN: 21 mg/dL — ABNORMAL HIGH (ref 6–20)
CALCIUM: 9.9 mg/dL (ref 8.9–10.3)
CHLORIDE: 106 mmol/L (ref 101–111)
CO2: 25 mmol/L (ref 22–32)
Creatinine, Ser: 0.8 mg/dL (ref 0.44–1.00)
GFR calc non Af Amer: >60 mL/min (ref >60)
GLUCOSE: 107 mg/dL — AB (ref 65–99)
POTASSIUM: 3.8 mmol/L (ref 3.5–5.1)
Sodium: 140 mmol/L (ref 135–145)

## 2017-12-08 LAB — URINALYSIS, ROUTINE W REFLEX MICROSCOPIC
BACTERIA UA: NONE SEEN
Glucose, UA: NEGATIVE mg/dL
Hgb urine dipstick: NEGATIVE
KETONES UR: 20 mg/dL — AB
LEUKOCYTES UA: NEGATIVE
NITRITE: NEGATIVE
PH: 5 (ref 5.0–8.0)
PROTEIN: 30 mg/dL — AB
Specific Gravity, Urine: >1.046 — ABNORMAL HIGH (ref 1.005–1.030)

## 2017-12-08 MED ORDER — HYDROMORPHONE HCL 1 MG/ML IJ SOLN
1.0000 mg | Freq: Once | INTRAMUSCULAR | Status: AC
  Administered 2017-12-08: 1 mg via INTRAVENOUS
  Filled 2017-12-08: qty 1

## 2017-12-08 MED ORDER — DIAZEPAM 5 MG PO TABS: 5.0000 mg | ORAL_TABLET | Freq: Three times a day (TID) | ORAL | 0 refills | Status: DC | PRN

## 2017-12-08 MED ORDER — KETOROLAC TROMETHAMINE 30 MG/ML IJ SOLN
15.0000 mg | Freq: Once | INTRAMUSCULAR | Status: AC
  Administered 2017-12-08: 15 mg via INTRAVENOUS
  Filled 2017-12-08: qty 1

## 2017-12-08 MED ORDER — ONDANSETRON HCL 4 MG/2ML IJ SOLN
4.0000 mg | Freq: Once | INTRAMUSCULAR | Status: AC
  Administered 2017-12-08: 4 mg via INTRAVENOUS
  Filled 2017-12-08: qty 2

## 2017-12-08 MED ORDER — OXYCODONE-ACETAMINOPHEN 5-325 MG PO TABS: 1.0000 | ORAL_TABLET | ORAL | 0 refills | Status: DC | PRN

## 2017-12-08 MED ORDER — DIAZEPAM 5 MG PO TABS
5.0000 mg | ORAL_TABLET | Freq: Once | ORAL | Status: AC
  Administered 2017-12-08: 5 mg via ORAL
  Filled 2017-12-08: qty 1

## 2017-12-08 MED ORDER — LIDOCAINE 5 % EX PTCH: 1.0000 | MEDICATED_PATCH | CUTANEOUS | 0 refills | Status: DC

## 2017-12-08 MED ORDER — HYDROCODONE-ACETAMINOPHEN 5-325 MG PO TABS: 1.0000 | ORAL_TABLET | ORAL | 0 refills | Status: DC | PRN

## 2017-12-08 NOTE — ED Provider Notes (Signed)
[facility] EMERGENCY DEPARTMENT Provider Note   CSN: [id number] Arrival date & time: 12/07/17  2142     History   Chief Complaint Chief Complaint  Patient presents with  . Back Pain    HPI Melody Johnson is a 52 y.o. female.  Patient presents to the emergency department for evaluation of back pain.  Patient reports that her back pain started as a slight pain last week.  She has noticed that the pain is worsened each day.  Pain is predominantly on the side of her lower back, not midline.  She does not have radiation to lower extremities, no numbness, tingling or weakness.  She denies any direct injury.  She saw her doctor yesterday and was placed on Mobic and Zanaflex, has not had any relief.  In fact tonight, her pain significantly worsened, causing her to be unable to sleep.     Past Medical History:  Diagnosis Date  . Anxiety   . Arthritis   . Depression   . Diabetes mellitus without complication (HCC)    off meds after gastric bypass  . GERD (gastroesophageal reflux disease)   . High cholesterol   . Hot flashes 12/24/2014  . Hypertension    off meds after gastric bypass  . Night sweats 12/24/2014  . Obstructive sleep apnea    not using CPAP due to gastric bypass- not used for iver 2 yrs  . PMB (postmenopausal bleeding) 12/25/2015  . PONV (postoperative nausea and vomiting)   . Postmenopausal HRT (hormone replacement therapy) 12/25/2015    Patient Active Problem List   Diagnosis Date Noted  . PMB (postmenopausal bleeding) 12/25/2015  . Postmenopausal HRT (hormone replacement therapy) 12/25/2015  . Hot flashes 12/24/2014  . Night sweats 12/24/2014  . NAUSEA AND VOMITING 10/21/2009  . OTHER DYSPHAGIA 10/21/2009  . ANXIETY 03/06/2008  . OBSESSIVE-COMPULSIVE DISORDER 03/06/2008  . DEPRESSION 03/06/2008  . HYPERTENSION 03/06/2008  . ESOPHAGITIS, REFLUX 03/06/2008  . GERD 03/06/2008  . GASTRITIS, ANTRAL 03/06/2008  . HIATAL HERNIA 03/06/2008  . IRRITABLE BOWEL  SYNDROME 03/06/2008  . ROSACEA 03/06/2008  . DIARRHEA 03/06/2008  . ALLERGY 03/06/2008    Past Surgical History:  Procedure Laterality Date  . CATARACT EXTRACTION W/PHACO Left 07/24/2014   Procedure: CATARACT EXTRACTION PHACO AND INTRAOCULAR LENS PLACEMENT LEFT;  Surgeon: Gemma Payor, MD;  Location: [facility];  Service: Ophthalmology;  Laterality: Left;  CDE:1.82  . CATARACT EXTRACTION W/PHACO Right 08/04/2014   Procedure: CATARACT EXTRACTION PHACO AND INTRAOCULAR LENS PLACEMENT RIGHT EYE CDE=1.23;  Surgeon: Gemma Payor, MD;  Location: [facility];  Service: Ophthalmology;  Laterality: Right;  . CERVICAL SPINE SURGERY  2000  . CHOLECYSTECTOMY  2002  . GASTRIC ROUX-EN-Y  05/01/2012   Procedure: LAPAROSCOPIC ROUX-EN-Y GASTRIC BYPASS WITH UPPER ENDOSCOPY;  Surgeon: Lodema Pilot, DO;  Location: [facility];  Service: General;  Laterality: N/A;  . LASIK  1996  . LITHOTRIPSY  05/2011     OB History    Gravida  0   Para      Term      Preterm      AB      Living        SAB      TAB      Ectopic      Multiple      Live Births               Home Medications    Prior to Admission medications   Medication Sig Start Date End  Date Taking? Authorizing Provider  ALPRAZolam Prudy Feeler) 0.5 MG tablet Take 0.5 mg by mouth 3 (three) times daily as needed for anxiety.    [provider]  Beclomethasone Dipropionate 40 MCG/ACT AERS Place 1 spray into the nose daily. 09/22/16   Alfonse Spruce, MD  Calcium Citrate-Vitamin D ([employer] CALCIUM CITRATE +D3 MINI) 200-250 MG-UNIT TABS Take by mouth.    [provider]  cetirizine (ZYRTEC) 10 MG tablet Take 10 mg by mouth every morning.     [provider]  diazepam (VALIUM) 5 MG tablet Take 1 tablet (5 mg total) by mouth every 8 (eight) hours as needed for muscle spasms. 12/08/17   Gilda Crease, MD  escitalopram (LEXAPRO) 20 MG tablet Take 20 mg by mouth. 01/13/16 04/12/16  [provider]  estradiol (ESTRACE) 1  MG tablet TAKE 1 TABLET BY MOUTH ONCE DAILY 09/18/17   Lazaro Arms, MD  meloxicam (MOBIC) 15 MG tablet Take 15 mg by mouth. 12/07/17   [provider]  mirtazapine (REMERON) 30 MG tablet Take 30 mg by mouth at bedtime.    [provider]  montelukast (SINGULAIR) 10 MG tablet TAKE (1) TABLET BY MOUTH AT BEDTIME. 03/14/17   Alfonse Spruce, MD  Multiple Vitamin (MULTIVITAMIN) tablet Take by mouth daily. Freedavite Multi Vit    [provider]  Multiple Vitamins-Minerals (MULTIVITAMIN WITH MINERALS) tablet Take by mouth.    [provider]  omeprazole (PRILOSEC) 20 MG capsule Take 20 mg by mouth every morning.     [provider]  omeprazole (PRILOSEC) 20 MG capsule Take 20 mg by mouth daily. 11/30/17   [provider]  oxyCODONE-acetaminophen (PERCOCET) 5-325 MG tablet Take 1-2 tablets by mouth every 4 (four) hours as needed. 12/08/17   Gilda Crease, MD  progesterone (PROMETRIUM) 200 MG capsule TAKE (1) CAPSULE BY MOUTH AT BEDTIME. 04/14/17   Adline Potter, NP  progesterone (PROMETRIUM) 200 MG capsule Take 200 mg by mouth daily.    [provider]  RESTASIS MULTIDOSE 0.05 % ophthalmic emulsion  01/26/16   [provider]  tiZANidine (ZANAFLEX) 4 MG tablet Take 4 mg by mouth daily. 12/07/17   [provider]  valACYclovir (VALTREX) 500 MG tablet Take 500 mg by mouth. 01/22/16   [provider]  valACYclovir (VALTREX) 500 MG tablet Take 500 mg by mouth daily. 11/24/17   [provider]    Family History Family History  Problem Relation Age of Onset  . Cancer Sister        cervical  . Cancer Maternal Grandmother        colon  . Crohn's disease Mother   . Leukemia Mother   . Hyperlipidemia Mother   . Thyroid disease Mother   . Stroke Mother   . Diabetes Father   . Hypertension Father   . Hyperlipidemia Father   . Diabetes Sister     Social History Social History   Tobacco  Use  . Smoking status: Never Smoker  . Smokeless tobacco: Never Used  Substance Use Topics  . Alcohol use: Yes    Comment: occ  . Drug use: No     Allergies   Bacitracin-polymyxin b; Codeine; Neomycin-bacitracin zn-polymyx; Penicillins; Neomycin-polymyxin-gramicidin; Roxicet [oxycodone-acetaminophen]; Sulfonamide derivatives; Penicillin g; and Sulfamethoxazole   Review of Systems Review of Systems  Musculoskeletal: Positive for back pain.  All other systems reviewed and are negative.    Physical Exam Updated Vital Signs BP (!) 147/103  Pulse 86   Temp 98.1 F (36.7 C) (Oral)   Resp 18   Ht 5\' 3"  (1.6 m)   Wt 77.6 kg (171 lb)   LMP 10/18/2015   SpO2 96%   BMI 30.29 kg/m   Physical Exam  Constitutional: She is oriented to person, place, and time. She appears well-developed and well-nourished. No distress.  HENT:  Head: Normocephalic and atraumatic.  Right Ear: Hearing normal.  Left Ear: Hearing normal.  Nose: Nose normal.  Mouth/Throat: Oropharynx is clear and moist and mucous membranes are normal.  Eyes: Pupils are equal, round, and reactive to light. Conjunctivae and EOM are normal.  Neck: Normal range of motion. Neck supple.  Cardiovascular: Regular rhythm, S1 normal and S2 normal. Exam reveals no gallop and no friction rub.  No murmur heard. Pulmonary/Chest: Effort normal and breath sounds normal. No respiratory distress. She exhibits no tenderness.  Abdominal: Soft. Normal appearance and bowel sounds are normal. There is no hepatosplenomegaly. There is no tenderness. There is no rebound, no guarding, no tenderness at McBurney's point and negative Murphy's sign. No hernia.  Musculoskeletal: Normal range of motion.       Lumbar back: She exhibits tenderness and spasm.       Back:  Neurological: She is alert and oriented to person, place, and time. She has normal strength. No cranial nerve deficit or sensory deficit. Coordination normal. GCS eye subscore is 4.  GCS verbal subscore is 5. GCS motor subscore is 6.  Skin: Skin is warm, dry and intact. No rash noted. No cyanosis.  Psychiatric: She has a normal mood and affect. Her speech is normal and behavior is normal. Thought content normal.  Nursing note and vitals reviewed.    ED Treatments / Results  Labs (all labs ordered are listed, but only abnormal results are displayed) Labs Reviewed  CBC - Abnormal; Notable for the following components:      Result Value   Platelets 413 (*)    All other components within normal limits  BASIC METABOLIC PANEL - Abnormal; Notable for the following components:   Glucose, Bld 107 (*)    BUN 21 (*)    All other components within normal limits  URINALYSIS, ROUTINE W REFLEX MICROSCOPIC - Abnormal; Notable for the following components:   APPearance HAZY (*)    Specific Gravity, Urine >1.046 (*)    Bilirubin Urine SMALL (*)    Ketones, ur 20 (*)    Protein, ur 30 (*)    All other components within normal limits    EKG None  Radiology Ct Renal Stone Study  Result Date: 12/08/2017 CLINICAL DATA:  Low back pain EXAM: CT ABDOMEN AND PELVIS WITHOUT CONTRAST TECHNIQUE: Multidetector CT imaging of the abdomen and pelvis was performed following the standard protocol without IV contrast. COMPARISON:  CT abdomen pelvis 04/25/2011 FINDINGS: LOWER CHEST: No basilar pulmonary nodules or pleural effusion. No apical pericardial effusion. HEPATOBILIARY: Normal hepatic contours and density. No intra- or extrahepatic biliary dilatation. Status post cholecystectomy. PANCREAS: Normal parenchymal contours without ductal dilatation. No peripancreatic fluid collection. SPLEEN: Normal. ADRENALS/URINARY TRACT: --Adrenal glands: Normal. --Right kidney/ureter: No hydronephrosis, nephroureterolithiasis, perinephric stranding or solid renal mass. --Left kidney/ureter: Single nonobstructing renal calculus measuring 2 mm. No hydronephrosis, perinephric stranding or solid renal mass.  --Urinary bladder: Normal for degree of distention STOMACH/BOWEL: --Stomach/Duodenum: Status post gastrectomy. --Small bowel: No dilatation or inflammation. --Colon: No focal abnormality. --Appendix: Normal. VASCULAR/LYMPHATIC: Normal course and caliber of the major abdominal vessels. No abdominal or pelvic  lymphadenopathy. REPRODUCTIVE: Normal uterus and ovaries. MUSCULOSKELETAL. No bony spinal canal stenosis or focal osseous abnormality. OTHER: None. IMPRESSION: No acute abdominal or pelvic abnormality. Electronically Signed   By: Deatra RobinsonKevin  Herman M.D.   On: 12/08/2017 04:28    Procedures Procedures (including critical care time)  Medications Ordered in ED Medications  ondansetron (ZOFRAN) injection 4 mg (4 mg Intravenous Given 12/08/17 0236)  HYDROmorphone (DILAUDID) injection 1 mg (1 mg Intravenous Given 12/08/17 0237)  ketorolac (TORADOL) 30 MG/ML injection 15 mg (15 mg Intravenous Given 12/08/17 0337)  diazepam (VALIUM) tablet 5 mg (5 mg Oral Given 12/08/17 [id number])     Initial Impression / Assessment and Plan / ED Course  I have reviewed the triage vital signs and the nursing notes.  Pertinent labs & imaging results that were available during my care of the patient were reviewed by me and considered in my medical decision making (see chart for details).     Patient presents for evaluation of back pain that has been ongoing for approximately a week.  It has slowly worsened over time without any known injury.  Examination was consistent with intermittent muscle spasm.  She had a constant dull pain that significantly worsened with movement.  Due to the severity of her pain currently, however, kidney stone was also considered.  Patient was standing in the room slightly pacing.  She apparently feels better standing up than lying down.  Urinalysis and CT scan performed, no acute abnormality noted.  Patient's examination is consistent with some spasm and soft tissue tenderness.  She does not have any  neurologic symptoms.  She has normal strength, sensation in her lower extremities.  No foot drop.  No saddle anesthesia.  Patient treated with analgesia and muscle relaxer, will require continued treatment at home.  Follow-up with PCP.  Final Clinical Impressions(s) / ED Diagnoses   Final diagnoses:  Acute bilateral low back pain without sciatica  Muscle spasm    ED Discharge Orders        Ordered    oxyCODONE-acetaminophen (PERCOCET) 5-325 MG tablet  Every 4 hours PRN     12/08/17 0611    diazepam (VALIUM) 5 MG tablet  Every 8 hours PRN     12/08/17 0611       Gilda CreasePollina, Sonja Manseau J, MD 12/08/17 [phone]

## 2017-12-08 NOTE — Discharge Instructions (Addendum)
Stop taking the tizanidine.  Use the Valium instead for muscle spasms.  You may continue taking the meloxicam.  Use the Vicodin for severe pain.

## 2017-12-22 ENCOUNTER — Other Ambulatory Visit: Payer: Self-pay | Admitting: Obstetrics & Gynecology

## 2018-06-15 ENCOUNTER — Other Ambulatory Visit: Payer: Self-pay | Admitting: Physician Assistant

## 2018-06-15 ENCOUNTER — Ambulatory Visit
Admission: RE | Admit: 2018-06-15 | Discharge: 2018-06-15 | Disposition: A | Discharge status: Home / Self Care | Payer: BLUE CROSS/BLUE SHIELD | Source: Ambulatory Visit | Attending: Physician Assistant | Admitting: Physician Assistant

## 2018-06-15 DIAGNOSIS — R1032 Left lower quadrant pain: Secondary | ICD-10-CM

## 2018-06-18 ENCOUNTER — Other Ambulatory Visit: Payer: Self-pay | Admitting: Physician Assistant

## 2018-06-18 DIAGNOSIS — R1032 Left lower quadrant pain: Secondary | ICD-10-CM

## 2018-06-22 ENCOUNTER — Ambulatory Visit
Admission: RE | Admit: 2018-06-22 | Discharge: 2018-06-22 | Disposition: A | Discharge status: Home / Self Care | Payer: BLUE CROSS/BLUE SHIELD | Source: Ambulatory Visit | Attending: Physician Assistant | Admitting: Physician Assistant

## 2018-06-22 DIAGNOSIS — R1032 Left lower quadrant pain: Secondary | ICD-10-CM

## 2018-07-16 ENCOUNTER — Other Ambulatory Visit (HOSPITAL_COMMUNITY): Payer: Self-pay | Admitting: Obstetrics and Gynecology

## 2018-07-16 DIAGNOSIS — Z1231 Encounter for screening mammogram for malignant neoplasm of breast: Secondary | ICD-10-CM

## 2018-08-09 ENCOUNTER — Ambulatory Visit (HOSPITAL_COMMUNITY)
Admission: RE | Admit: 2018-08-09 | Discharge: 2018-08-09 | Disposition: A | Discharge status: Home / Self Care | Payer: BLUE CROSS/BLUE SHIELD | Source: Ambulatory Visit | Attending: Obstetrics and Gynecology | Admitting: Obstetrics and Gynecology

## 2018-08-09 DIAGNOSIS — Z1231 Encounter for screening mammogram for malignant neoplasm of breast: Secondary | ICD-10-CM | POA: Diagnosis present

## 2018-08-23 ENCOUNTER — Other Ambulatory Visit (HOSPITAL_COMMUNITY)
Admission: RE | Admit: 2018-08-23 | Discharge: 2018-08-23 | Disposition: A | Discharge status: Home / Self Care | Payer: BLUE CROSS/BLUE SHIELD | Source: Ambulatory Visit | Attending: Adult Health | Admitting: Adult Health

## 2018-08-23 ENCOUNTER — Encounter: Payer: Self-pay | Admitting: Adult Health

## 2018-08-23 ENCOUNTER — Ambulatory Visit (INDEPENDENT_AMBULATORY_CARE_PROVIDER_SITE_OTHER): Payer: BLUE CROSS/BLUE SHIELD | Admitting: Adult Health

## 2018-08-23 VITALS — BP 158/90 | HR 78 | Ht 63.0 in | Wt 169.5 lb

## 2018-08-23 DIAGNOSIS — Z1212 Encounter for screening for malignant neoplasm of rectum: Secondary | ICD-10-CM

## 2018-08-23 DIAGNOSIS — Z01419 Encounter for gynecological examination (general) (routine) without abnormal findings: Secondary | ICD-10-CM | POA: Insufficient documentation

## 2018-08-23 DIAGNOSIS — Z1211 Encounter for screening for malignant neoplasm of colon: Secondary | ICD-10-CM

## 2018-08-23 DIAGNOSIS — Z79890 Hormone replacement therapy (postmenopausal): Secondary | ICD-10-CM | POA: Insufficient documentation

## 2018-08-23 DIAGNOSIS — I10 Essential (primary) hypertension: Secondary | ICD-10-CM

## 2018-08-23 LAB — HEMOCCULT GUIAC POC 1CARD (OFFICE): FECAL OCCULT BLD: NEGATIVE

## 2018-08-23 MED ORDER — HYDROCHLOROTHIAZIDE 12.5 MG PO CAPS: 12.5000 mg | ORAL_CAPSULE | Freq: Every day | ORAL | 3 refills | Status: DC

## 2018-08-23 NOTE — Progress Notes (Signed)
Patient ID: Melody Johnson, female   DOB: 07/23/1965, 53 y.o.   MRN: [id number] History of Present Illness: Melody Johnson is a 53 year old white female, married, PM in for a well woman gyn exam and pap. BP elevated today. Has had stressful week, son had a seizure during fire training. Had history of HTN but after gastric surgery it returned to normal  PCP is [facility].    Current Medications, Allergies, Past Medical History, Past Surgical History, Family History and Social History were reviewed in [employer] record.     Review of Systems: Patient denies any headaches, hearing loss, fatigue, blurred vision, shortness of breath, chest pain, abdominal pain, problems with bowel movements, urination, or intercourse(painful at beginning at times). No joint pain or mood swings.    Physical Exam:BP (!) 158/90 (BP Location: Left Arm, Cuff Size: Normal)   Pulse 78   Ht 5\' 3"  (1.6 m)   Wt 169 lb 8 oz (76.9 kg)   LMP 10/18/2015   BMI 30.03 kg/m  General:  Well developed, well nourished, no acute distress Skin:  Warm and dry Neck:  Midline trachea, normal thyroid, good ROM, no lymphadenopathy Lungs; Clear to auscultation bilaterally Breast:  No dominant palpable mass, retraction, or nipple discharge Cardiovascular: Regular rate and rhythm Abdomen:  Soft, non tender, no hepatosplenomegaly Pelvic:  External genitalia is normal in appearance, no lesions.  The vagina is normal in appearance. Urethra has no lesions or masses. The cervix is smooth, pap with HPV performed.  Uterus is felt to be normal size, shape, and contour.  No adnexal masses or tenderness noted.Bladder is non tender, no masses felt. Rectal: Good sphincter tone, no polyps, or hemorrhoids felt.  Hemoccult negative. Extremities/musculoskeletal:  No swelling or varicosities noted, no clubbing or cyanosis Psych:  No mood changes, alert and cooperative,seems happy Fall risk is low. PHQ 2  score is 0. Examination chaperoned by Federico Flake CMA. Will ad Microzide 12.5 mg and she has appt with PCP in March.  Impression: 1. Encounter for gynecological examination with Papanicolaou smear of cervix   2. Screening for colorectal cancer   3. Essential hypertension   4. Hormone replacement therapy (HRT)       Plan: Meds ordered this encounter  Medications  . hydrochlorothiazide (MICROZIDE) 12.5 MG capsule    Sig: Take 1 capsule (12.5 mg total) by mouth daily.    Dispense:  30 capsule    Refill:  3    Order Specific Question:   Supervising Provider    Answer:   Duane Lope H [[id number]]   Continue HRT has refills Try lubricate and increased foreplay with sex. F/U with PCp on BP Physical in 1 year Pap in 3 if normal Mammogram yearly

## 2018-08-24 LAB — CYTOLOGY - PAP
Diagnosis: NEGATIVE
HPV (WINDOPATH): NOT DETECTED

## 2018-10-30 ENCOUNTER — Other Ambulatory Visit: Payer: Self-pay | Admitting: Obstetrics & Gynecology

## 2018-11-02 ENCOUNTER — Other Ambulatory Visit: Payer: Self-pay | Admitting: *Deleted

## 2018-11-02 MED ORDER — ESTRADIOL 1 MG PO TABS: 1.0000 mg | ORAL_TABLET | Freq: Every day | ORAL | 3 refills | Status: DC

## 2019-01-03 ENCOUNTER — Other Ambulatory Visit: Payer: Self-pay | Admitting: Obstetrics & Gynecology

## 2019-06-13 IMAGING — CR DG ABDOMEN 1V
1 series · 1 of 1 positions shown · non-contrast
Comparison: Abdomen and pelvis CT dated 12/08/2017.

CLINICAL DATA: Left lower quadrant abdominal pain 1 day. Hematuria.

EXAM:
ABDOMEN - 1 VIEW

[w abdomen upright]
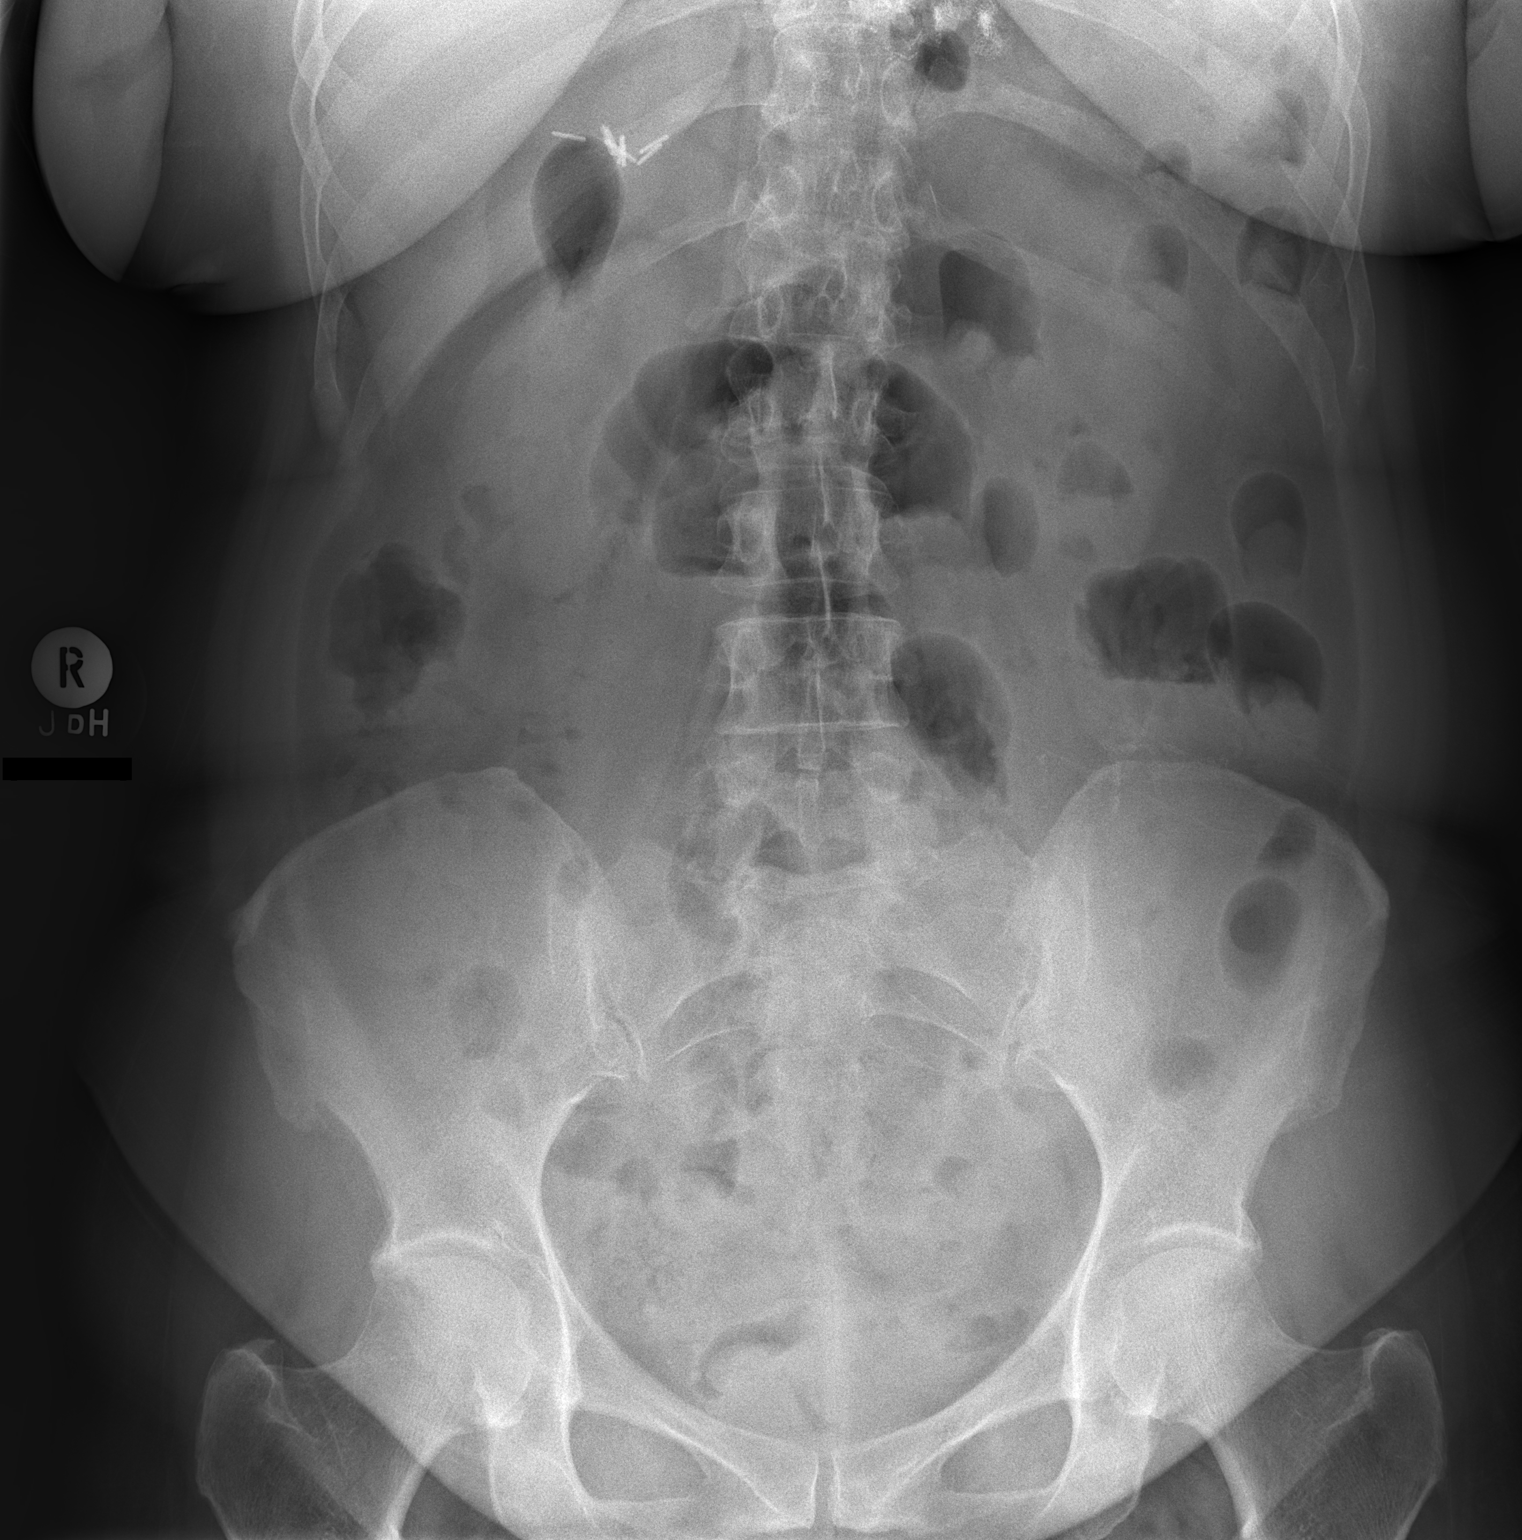

[1 of 1 positions shown; findings below may reference images not displayed]

FINDINGS: Normal bowel gas pattern. No radiographically visible calcified
urinary tract calculi. Cholecystectomy clips. Unremarkable bones.
IMPRESSION: No acute abnormality.

## 2019-07-15 ENCOUNTER — Other Ambulatory Visit (HOSPITAL_COMMUNITY): Payer: Self-pay | Admitting: Obstetrics and Gynecology

## 2019-07-15 DIAGNOSIS — Z1231 Encounter for screening mammogram for malignant neoplasm of breast: Secondary | ICD-10-CM

## 2019-07-23 ENCOUNTER — Other Ambulatory Visit: Payer: Self-pay | Admitting: Obstetrics & Gynecology

## 2019-08-14 ENCOUNTER — Other Ambulatory Visit: Payer: Self-pay

## 2019-08-14 ENCOUNTER — Ambulatory Visit (HOSPITAL_COMMUNITY)
Admission: RE | Admit: 2019-08-14 | Discharge: 2019-08-14 | Disposition: A | Discharge status: Home / Self Care | Payer: BC Managed Care – PPO | Source: Ambulatory Visit | Attending: Obstetrics and Gynecology | Admitting: Obstetrics and Gynecology

## 2019-08-14 DIAGNOSIS — Z1231 Encounter for screening mammogram for malignant neoplasm of breast: Secondary | ICD-10-CM | POA: Diagnosis not present

## 2020-01-25 ENCOUNTER — Other Ambulatory Visit: Payer: Self-pay | Admitting: Obstetrics & Gynecology

## 2020-02-23 ENCOUNTER — Other Ambulatory Visit: Payer: Self-pay | Admitting: Obstetrics & Gynecology

## 2020-03-10 ENCOUNTER — Other Ambulatory Visit: Payer: Self-pay

## 2020-03-10 ENCOUNTER — Ambulatory Visit
Admission: EM | Admit: 2020-03-10 | Discharge: 2020-03-10 | Disposition: A | Discharge status: Home / Self Care | Payer: BC Managed Care – PPO | Attending: Emergency Medicine | Admitting: Emergency Medicine

## 2020-03-12 LAB — SARS-COV-2, NAA 2 DAY TAT

## 2020-03-12 LAB — NOVEL CORONAVIRUS, NAA: SARS-CoV-2, NAA: NOT DETECTED

## 2020-04-02 ENCOUNTER — Other Ambulatory Visit: Payer: Self-pay

## 2020-04-02 ENCOUNTER — Ambulatory Visit
Admission: EM | Admit: 2020-04-02 | Discharge: 2020-04-02 | Disposition: A | Discharge status: Home / Self Care | Payer: BC Managed Care – PPO | Attending: Emergency Medicine | Admitting: Emergency Medicine

## 2020-04-02 DIAGNOSIS — Z20822 Contact with and (suspected) exposure to covid-19: Secondary | ICD-10-CM | POA: Diagnosis not present

## 2020-04-02 NOTE — ED Triage Notes (Signed)
Pt presents with exposure to covid at sons school, in need of testing. Denies symptoms.

## 2020-04-04 LAB — NOVEL CORONAVIRUS, NAA: SARS-CoV-2, NAA: NOT DETECTED

## 2020-04-04 LAB — SARS-COV-2, NAA 2 DAY TAT

## 2020-05-16 ENCOUNTER — Ambulatory Visit
Admission: EM | Admit: 2020-05-16 | Discharge: 2020-05-16 | Disposition: A | Discharge status: Home / Self Care | Payer: BC Managed Care – PPO | Attending: Emergency Medicine | Admitting: Emergency Medicine

## 2020-05-16 ENCOUNTER — Encounter: Payer: Self-pay | Admitting: Emergency Medicine

## 2020-05-16 ENCOUNTER — Other Ambulatory Visit: Payer: Self-pay

## 2020-05-16 DIAGNOSIS — J069 Acute upper respiratory infection, unspecified: Secondary | ICD-10-CM | POA: Diagnosis not present

## 2020-05-16 DIAGNOSIS — Z1152 Encounter for screening for COVID-19: Secondary | ICD-10-CM | POA: Diagnosis not present

## 2020-05-16 DIAGNOSIS — R509 Fever, unspecified: Secondary | ICD-10-CM

## 2020-05-16 DIAGNOSIS — Z20822 Contact with and (suspected) exposure to covid-19: Secondary | ICD-10-CM | POA: Diagnosis not present

## 2020-05-16 MED ORDER — DEXAMETHASONE 4 MG PO TABS: 4.0000 mg | ORAL_TABLET | Freq: Every day | ORAL | 0 refills | Status: AC

## 2020-05-16 MED ORDER — CETIRIZINE HCL 10 MG PO TABS: 10.0000 mg | ORAL_TABLET | Freq: Every day | ORAL | 0 refills | Status: DC

## 2020-05-16 MED ORDER — FLUTICASONE PROPIONATE 50 MCG/ACT NA SUSP: 1.0000 | Freq: Every day | NASAL | 0 refills | Status: DC

## 2020-05-16 MED ORDER — BENZONATATE 100 MG PO CAPS: 100.0000 mg | ORAL_CAPSULE | Freq: Three times a day (TID) | ORAL | 0 refills | Status: DC

## 2020-05-16 NOTE — ED Triage Notes (Addendum)
Fever, chills, cough, upset stomach that started Thursday night.pt's son seen here x 2 days ago and is covid +.  Pt has not received covid vaccine.

## 2020-05-16 NOTE — ED Provider Notes (Signed)
[facility]   [id number] 05/16/20 Arrival Time: 0806   CC: COVID symptoms  SUBJECTIVE: History from: patient.  Melody Johnson is a 54 y.o. female who presents to the urgent care for complaint of chills, fever, cough, stomach upset that started this past Thursday.  Reported Covid positive exposure at home 2 days ago.  Denies recent travel.  Has tried OTC medication without relief.  Denies aggravating factors.  Denies previous symptoms in the past.   Denies  fatigue, sinus pain, rhinorrhea, sore throat, SOB, wheezing, chest pain, nausea, changes in bowel or bladder habits.     ROS: As per HPI.  All other pertinent ROS negative.     Past Medical History:  Diagnosis Date   Anxiety    Arthritis    Depression    Diabetes mellitus without complication (HCC)    off meds after gastric bypass   GERD (gastroesophageal reflux disease)    High cholesterol    Hot flashes 12/24/2014   Hypertension    off meds after gastric bypass   Night sweats 12/24/2014   Obstructive sleep apnea    not using CPAP due to gastric bypass- not used for iver 2 yrs   PMB (postmenopausal bleeding) 12/25/2015   PONV (postoperative nausea and vomiting)    Postmenopausal HRT (hormone replacement therapy) 12/25/2015   Past Surgical History:  Procedure Laterality Date   CATARACT EXTRACTION W/PHACO Left 07/24/2014   Procedure: CATARACT EXTRACTION PHACO AND INTRAOCULAR LENS PLACEMENT LEFT;  Surgeon: Gemma Payor, MD;  Location: [facility];  Service: Ophthalmology;  Laterality: Left;  CDE:1.82   CATARACT EXTRACTION W/PHACO Right 08/04/2014   Procedure: CATARACT EXTRACTION PHACO AND INTRAOCULAR LENS PLACEMENT RIGHT EYE CDE=1.23;  Surgeon: Gemma Payor, MD;  Location: [facility];  Service: Ophthalmology;  Laterality: Right;   CERVICAL SPINE SURGERY  2000   CHOLECYSTECTOMY  2002   GASTRIC ROUX-EN-Y  05/01/2012   Procedure: LAPAROSCOPIC ROUX-EN-Y GASTRIC BYPASS WITH UPPER ENDOSCOPY;  Surgeon: Lodema Pilot, DO;   Location: [facility];  Service: General;  Laterality: N/A;   LASIK  1996   LITHOTRIPSY  05/2011   Allergies  Allergen Reactions   Bacitracin-Polymyxin B Other (See Comments)    Cause wound to become infected Will actually cause infection instead of healing the area it has been applied to.   Codeine Nausea Only and Nausea And Vomiting   Neomycin-Bacitracin Zn-Polymyx Other (See Comments)    Will actually cause infection instead of healing the area it has been applied to.   Penicillins Rash    Starts at feet and works its way up the body.    Neomycin-Polymyxin-Gramicidin Other (See Comments)    Cause wound to become infected   Roxicet [Oxycodone-Acetaminophen] Itching   Sulfonamide Derivatives     REACTION: UNKNOWN REACTION   Penicillin G Rash   Sulfamethoxazole Rash   No current facility-administered medications on file prior to encounter.   Current Outpatient Medications on File Prior to Encounter  Medication Sig Dispense Refill   ALPRAZolam (XANAX) 0.5 MG tablet Take 0.5 mg by mouth 3 (three) times daily as needed for anxiety.     Beclomethasone Diprop, Nasal, (QNASL NA) Place into the nose at bedtime.     Biotin w/ Vitamins C & E (HAIR/SKIN/NAILS PO) Take by mouth daily.     Calcium Citrate-Vitamin D (CVS CALCIUM CITRATE +D3 MINI) 200-250 MG-UNIT TABS Take by mouth daily.      estradiol (ESTRACE) 1 MG tablet TAKE 1 TABLET BY MOUTH ONCE DAILY 120 tablet  3   estradiol (ESTRACE) 1 MG tablet TAKE 1 TABLET BY MOUTH EVERY DAY 120 tablet 3   hydrochlorothiazide (MICROZIDE) 12.5 MG capsule Take 1 capsule (12.5 mg total) by mouth daily. 30 capsule 3   mirtazapine (REMERON) 30 MG tablet Take 30 mg by mouth at bedtime.     montelukast (SINGULAIR) 10 MG tablet TAKE (1) TABLET BY MOUTH AT BEDTIME. 30 tablet 0   Multiple Vitamin (MULTIVITAMIN) tablet Take by mouth daily. Freedavite Multi Vit     Multiple Vitamins-Minerals (OCUVITE PO) Take by mouth daily.     omeprazole  (PRILOSEC) 20 MG capsule Take 20 mg by mouth every morning.      Probiotic Product (PROBIOTIC PO) Take by mouth daily.     progesterone (PROMETRIUM) 200 MG capsule TAKE ONE CAPSULE BY MOUTH AT BEDTIME 30 capsule 11   RESTASIS MULTIDOSE 0.05 % ophthalmic emulsion   11   Social History   Socioeconomic History   Marital status: Married    Spouse name: Not on file   Number of children: Not on file   Years of education: Not on file   Highest education level: Not on file  Occupational History   Not on file  Tobacco Use   Smoking status: Never Smoker   Smokeless tobacco: Never Used  Vaping Use   Vaping Use: Never used  Substance and Sexual Activity   Alcohol use: Yes    Comment: occ   Drug use: No   Sexual activity: Yes    Birth control/protection: Post-menopausal  Other Topics Concern   Not on file  Social History Narrative   Not on file   Social Determinants of Health   Financial Resource Strain:    Difficulty of Paying Living Expenses: Not on file  Food Insecurity:    Worried About Running Out of Food in the Last Year: Not on file   [employer] of Food in the Last Year: Not on file  Transportation Needs:    Lack of Transportation (Medical): Not on file   Lack of Transportation (Non-Medical): Not on file  Physical Activity:    Days of Exercise per Week: Not on file   Minutes of Exercise per Session: Not on file  Stress:    Feeling of Stress : Not on file  Social Connections:    Frequency of Communication with Friends and Family: Not on file   Frequency of Social Gatherings with Friends and Family: Not on file   Attends Religious Services: Not on file   Active Member of Clubs or Organizations: Not on file   Attends Banker Meetings: Not on file   Marital Status: Not on file  Intimate Partner Violence:    Fear of Current or Ex-Partner: Not on file   Emotionally Abused: Not on file   Physically Abused: Not on file   Sexually  Abused: Not on file   Family History  Problem Relation Age of Onset   Cancer Sister        cervical   Cancer Maternal Grandmother        colon   Crohn's disease Mother    Leukemia Mother    Hyperlipidemia Mother    Thyroid disease Mother    Stroke Mother    Diabetes Father    Hypertension Father    Hyperlipidemia Father    Diabetes Sister     OBJECTIVE:  Vitals:   05/16/20 0821 05/16/20 0822 05/16/20 0824  BP:   (!) 137/92  Pulse:  Marland Kitchen)  114   Resp:  18   Temp:  100 F (37.8 C)   TempSrc:  Oral   SpO2:  94%   Weight: 175 lb (79.4 kg)    Height: 5\' 3"  (1.6 m)       General appearance: alert; appears fatigued, but nontoxic; speaking in full sentences and tolerating own secretions HEENT: NCAT; Ears: EACs clear, TMs pearly gray; Eyes: PERRL.  EOM grossly intact. Sinuses: nontender; Nose: nares patent without rhinorrhea, Throat: oropharynx clear, tonsils non erythematous or enlarged, uvula midline  Neck: supple without LAD Lungs: unlabored respirations, symmetrical air entry; cough: moderate; no respiratory distress; CTAB Heart: regular rate and rhythm.  Radial pulses 2+ symmetrical bilaterally Skin: warm and dry Psychological: alert and cooperative; normal mood and affect  LABS:  No results found for this or any previous visit (from the past 24 hour(s)).   ASSESSMENT & PLAN:  1. Fever, unspecified   2. Viral URI with cough   3. Encounter for screening for COVID-19   4. Close exposure to COVID-19 virus     Meds ordered this encounter  Medications   fluticasone (FLONASE) 50 MCG/ACT nasal spray    Sig: Place 1 spray into both nostrils daily for 14 days.    Dispense:  16 g    Refill:  0   cetirizine (ZYRTEC ALLERGY) 10 MG tablet    Sig: Take 1 tablet (10 mg total) by mouth daily.    Dispense:  30 tablet    Refill:  0   benzonatate (TESSALON) 100 MG capsule    Sig: Take 1 capsule (100 mg total) by mouth every 8 (eight) hours.    Dispense:  30  capsule    Refill:  0   dexamethasone (DECADRON) 4 MG tablet    Sig: Take 1 tablet (4 mg total) by mouth daily for 7 days.    Dispense:  7 tablet    Refill:  0    Discharge instructions  COVID testing ordered.  It will take between 2-7 days for test results.  Someone will contact you regarding abnormal results.    In the meantime: You should remain isolated in your home for 10 days from symptom onset AND greater than 24 hours after symptoms resolution (absence of fever without the use of fever-reducing medication and improvement in respiratory symptoms), whichever is longer Get plenty of rest and push fluids Tessalon Perles prescribed for cough Zyrtec for nasal congestion, runny nose, and/or sore throat Flonase for nasal congestion and runny nose Decadron was prescribed Use medications daily for symptom relief Use OTC medications like ibuprofen or tylenol as needed fever or pain Call or go to the ED if you have any new or worsening symptoms such as fever, worsening cough, shortness of breath, chest tightness, chest pain, turning blue, changes in mental status, etc...   Reviewed expectations re: course of current medical issues. Questions answered. Outlined signs and symptoms indicating need for more acute intervention. Patient verbalized understanding. After Visit Summary given.         , FNP 05/16/20 [phone]

## 2020-05-16 NOTE — Discharge Instructions (Signed)

## 2020-05-17 LAB — NOVEL CORONAVIRUS, NAA: SARS-CoV-2, NAA: DETECTED — AB

## 2020-05-17 LAB — SARS-COV-2, NAA 2 DAY TAT

## 2020-05-18 ENCOUNTER — Ambulatory Visit (HOSPITAL_COMMUNITY)
Admission: RE | Admit: 2020-05-18 | Discharge: 2020-05-18 | Disposition: A | Discharge status: Home / Self Care | Payer: BC Managed Care – PPO | Source: Ambulatory Visit | Attending: Pulmonary Disease | Admitting: Pulmonary Disease

## 2020-05-18 ENCOUNTER — Other Ambulatory Visit: Payer: Self-pay | Admitting: Physician Assistant

## 2020-05-18 DIAGNOSIS — I10 Essential (primary) hypertension: Secondary | ICD-10-CM

## 2020-05-18 DIAGNOSIS — Z6831 Body mass index (BMI) 31.0-31.9, adult: Secondary | ICD-10-CM | POA: Diagnosis not present

## 2020-05-18 DIAGNOSIS — U071 COVID-19: Secondary | ICD-10-CM | POA: Diagnosis not present

## 2020-05-18 MED ORDER — SODIUM CHLORIDE 0.9 % IV SOLN: INTRAVENOUS | Status: DC | PRN

## 2020-05-18 MED ORDER — DIPHENHYDRAMINE HCL 50 MG/ML IJ SOLN: 50.0000 mg | Freq: Once | INTRAMUSCULAR | Status: DC | PRN

## 2020-05-18 MED ORDER — SOTROVIMAB 500 MG/8ML IV SOLN
500.0000 mg | Freq: Once | INTRAVENOUS | Status: AC
  Administered 2020-05-18: 500 mg via INTRAVENOUS

## 2020-05-18 MED ORDER — FAMOTIDINE IN NACL 20-0.9 MG/50ML-% IV SOLN: 20.0000 mg | Freq: Once | INTRAVENOUS | Status: DC | PRN

## 2020-05-18 MED ORDER — ALBUTEROL SULFATE HFA 108 (90 BASE) MCG/ACT IN AERS: 2.0000 | INHALATION_SPRAY | Freq: Once | RESPIRATORY_TRACT | Status: DC | PRN

## 2020-05-18 MED ORDER — METHYLPREDNISOLONE SODIUM SUCC 125 MG IJ SOLR: 125.0000 mg | Freq: Once | INTRAMUSCULAR | Status: DC | PRN

## 2020-05-18 MED ORDER — EPINEPHRINE 0.3 MG/0.3ML IJ SOAJ: 0.3000 mg | Freq: Once | INTRAMUSCULAR | Status: DC | PRN

## 2020-05-18 NOTE — Progress Notes (Signed)
Diagnosis: COVID-19  Physician: Dr. Patrick Wright  Procedure: Covid Infusion Clinic Med: Sotrovimab infusion - Provided patient with sotrovimab fact sheet for patients, parents, and caregivers prior to infusion.   Complications: No immediate complications noted  Discharge: Discharged home  If after the infusion you have any questions or concerns please call the Advanced Practice Provider at [phone] 

## 2020-05-18 NOTE — Discharge Instructions (Signed)

## 2020-05-18 NOTE — Progress Notes (Signed)
I connected by phone with Waverley Ohmann on 05/18/2020 at 9:04 AM to discuss the potential use of a new treatment for mild to moderate COVID-19 viral infection in non-hospitalized patients.  This patient is a 54 y.o. female that meets the FDA criteria for Emergency Use Authorization of COVID monoclonal antibody sotrovimab, casirivimab/imdevimab or bamlamivimab/estevimab.  Has a (+) direct SARS-CoV-2 viral test result  Has mild or moderate COVID-19   Is NOT hospitalized due to COVID-19  Is within 10 days of symptom onset  Has at least one of the high risk factor(s) for progression to severe COVID-19 and/or hospitalization as defined in EUA.  Specific high risk criteria : BMI > 25 and Cardiovascular disease or hypertension   I have spoken and communicated the following to the patient or parent/caregiver regarding COVID monoclonal antibody treatment:  1. FDA has authorized the emergency use for the treatment of mild to moderate COVID-19 in adults and pediatric patients with positive results of direct SARS-CoV-2 viral testing who are 38 years of age and older weighing at least 40 kg, and who are at high risk for progressing to severe COVID-19 and/or hospitalization.  2. The significant known and potential risks and benefits of COVID monoclonal antibody, and the extent to which such potential risks and benefits are unknown.  3. Information on available alternative treatments and the risks and benefits of those alternatives, including clinical trials.  4. Patients treated with COVID monoclonal antibody should continue to self-isolate and use infection control measures (e.g., wear mask, isolate, social distance, avoid sharing personal items, clean and disinfect "high touch" surfaces, and frequent handwashing) according to CDC guidelines.   5. The patient or parent/caregiver has the option to accept or refuse COVID monoclonal antibody treatment.  After reviewing this information with the  patient, the patient has agreed to receive one of the available covid 19 monoclonal antibodies and will be provided an appropriate fact sheet prior to infusion.  Sx onset 11/18. Set up for infusion on 11/22 @ 2:30pm. Directions given to [facility]. Pt is aware that insurance will be charged an infusion fee. Pt is unvaccinated.   Cline Crock 05/18/2020 9:04 AM

## 2020-05-18 NOTE — Progress Notes (Signed)
Patient reviewed Fact Sheet for Patients, Parents, and Caregivers for Emergency Use Authorization (EUA) of Sotrovimab for the Treatment of Coronavirus. Patient also reviewed and is agreeable to the estimated cost of treatment. Patient is agreeable to proceed.   

## 2020-05-25 ENCOUNTER — Telehealth: Payer: Self-pay | Admitting: Adult Health

## 2020-05-25 ENCOUNTER — Other Ambulatory Visit: Payer: Self-pay

## 2020-05-25 ENCOUNTER — Ambulatory Visit
Admission: EM | Admit: 2020-05-25 | Discharge: 2020-05-25 | Disposition: A | Discharge status: Home / Self Care | Payer: BC Managed Care – PPO | Attending: Emergency Medicine | Admitting: Emergency Medicine

## 2020-05-25 DIAGNOSIS — U071 COVID-19: Secondary | ICD-10-CM | POA: Diagnosis not present

## 2020-05-25 MED ORDER — DEXAMETHASONE 4 MG PO TABS: 4.0000 mg | ORAL_TABLET | Freq: Every day | ORAL | 0 refills | Status: AC

## 2020-05-25 MED ORDER — BENZONATATE 100 MG PO CAPS: 100.0000 mg | ORAL_CAPSULE | Freq: Three times a day (TID) | ORAL | 0 refills | Status: DC

## 2020-05-25 NOTE — Telephone Encounter (Signed)
Received call from patient on the COVID APP coverage #.  The patient is concerned because she received her Sotrovimab a week ago, and has still been coughing, running a fever, and feeling tired.  She denies shortness of breath, chest pain, or dizziness.  She wants to know what to do.  I recommended that she go to urgent care, her PCP, or the post covid clinic.  She let me know that she plans on going to urgent care today.    I reviewed with her reasons to go to the ER.  She understands.  Lillard Anes, NP

## 2020-05-25 NOTE — ED Provider Notes (Signed)
[facility]   [id number] 05/25/20 Arrival Time: 1446   CC: COVID Infections  SUBJECTIVE: History from: patient and family.  Melody Johnson is a 54 y.o. female who presented to the urgent care with a complaint of worsening fatigue and fever after post Covid infusion last week.  Has tested positive for Covid on 05/16/2020.  Has tried Decadron, Zyrtec, Flonase and Tessalon Perles with mild relief.  Denies alleviating or aggravating factors.  Denies previous symptoms in the past..   Denies  sinus pain, rhinorrhea, sore throat, SOB, wheezing, chest pain, nausea, changes in bowel or bladder habits.    ROS: As per HPI.  All other pertinent ROS negative.     Past Medical History:  Diagnosis Date  . Anxiety   . Arthritis   . Depression   . Diabetes mellitus without complication (HCC)    off meds after gastric bypass  . GERD (gastroesophageal reflux disease)   . High cholesterol   . Hot flashes 12/24/2014  . Hypertension    off meds after gastric bypass  . Night sweats 12/24/2014  . Obstructive sleep apnea    not using CPAP due to gastric bypass- not used for iver 2 yrs  . PMB (postmenopausal bleeding) 12/25/2015  . PONV (postoperative nausea and vomiting)   . Postmenopausal HRT (hormone replacement therapy) 12/25/2015   Past Surgical History:  Procedure Laterality Date  . CATARACT EXTRACTION W/PHACO Left 07/24/2014   Procedure: CATARACT EXTRACTION PHACO AND INTRAOCULAR LENS PLACEMENT LEFT;  Surgeon: Gemma Payor, MD;  Location: [facility];  Service: Ophthalmology;  Laterality: Left;  CDE:1.82  . CATARACT EXTRACTION W/PHACO Right 08/04/2014   Procedure: CATARACT EXTRACTION PHACO AND INTRAOCULAR LENS PLACEMENT RIGHT EYE CDE=1.23;  Surgeon: Gemma Payor, MD;  Location: [facility];  Service: Ophthalmology;  Laterality: Right;  . CERVICAL SPINE SURGERY  2000  . CHOLECYSTECTOMY  2002  . GASTRIC ROUX-EN-Y  05/01/2012   Procedure: LAPAROSCOPIC ROUX-EN-Y GASTRIC BYPASS WITH UPPER ENDOSCOPY;   Surgeon: Lodema Pilot, DO;  Location: [facility];  Service: General;  Laterality: N/A;  . LASIK  1996  . LITHOTRIPSY  05/2011   Allergies  Allergen Reactions  . Bacitracin-Polymyxin B Other (See Comments)    Cause wound to become infected Will actually cause infection instead of healing the area it has been applied to.  . Codeine Nausea Only and Nausea And Vomiting  . Neomycin-Bacitracin Zn-Polymyx Other (See Comments)    Will actually cause infection instead of healing the area it has been applied to.  . Penicillins Rash    Starts at feet and works its way up the body.   Marland Kitchen Neomycin-Polymyxin-Gramicidin Other (See Comments)    Cause wound to become infected  . Roxicet [Oxycodone-Acetaminophen] Itching  . Sulfonamide Derivatives     REACTION: UNKNOWN REACTION  . Penicillin G Rash  . Sulfamethoxazole Rash   No current facility-administered medications on file prior to encounter.   Current Outpatient Medications on File Prior to Encounter  Medication Sig Dispense Refill  . ALPRAZolam (XANAX) 0.5 MG tablet Take 0.5 mg by mouth 3 (three) times daily as needed for anxiety.    . Beclomethasone Diprop, Nasal, (QNASL NA) Place into the nose at bedtime.    . Biotin w/ Vitamins C & E (HAIR/SKIN/NAILS PO) Take by mouth daily.    . Calcium Citrate-Vitamin D (CVS CALCIUM CITRATE +D3 MINI) 200-250 MG-UNIT TABS Take by mouth daily.     . cetirizine (ZYRTEC ALLERGY) 10 MG tablet Take 1 tablet (10 mg total)  by mouth daily. 30 tablet 0  . estradiol (ESTRACE) 1 MG tablet TAKE 1 TABLET BY MOUTH ONCE DAILY 120 tablet 3  . estradiol (ESTRACE) 1 MG tablet TAKE 1 TABLET BY MOUTH EVERY DAY 120 tablet 3  . fluticasone (FLONASE) 50 MCG/ACT nasal spray Place 1 spray into both nostrils daily for 14 days. 16 g 0  . hydrochlorothiazide (MICROZIDE) 12.5 MG capsule Take 1 capsule (12.5 mg total) by mouth daily. 30 capsule 3  . mirtazapine (REMERON) 30 MG tablet Take 30 mg by mouth at bedtime.    . montelukast  (SINGULAIR) 10 MG tablet TAKE (1) TABLET BY MOUTH AT BEDTIME. 30 tablet 0  . Multiple Vitamin (MULTIVITAMIN) tablet Take by mouth daily. Freedavite Multi Vit    . Multiple Vitamins-Minerals (OCUVITE PO) Take by mouth daily.    Marland Kitchen omeprazole (PRILOSEC) 20 MG capsule Take 20 mg by mouth every morning.     . Probiotic Product (PROBIOTIC PO) Take by mouth daily.    . progesterone (PROMETRIUM) 200 MG capsule TAKE ONE CAPSULE BY MOUTH AT BEDTIME 30 capsule 11  . RESTASIS MULTIDOSE 0.05 % ophthalmic emulsion   11   Social History   Socioeconomic History  . Marital status: Married    Spouse name: Not on file  . Number of children: Not on file  . Years of education: Not on file  . Highest education level: Not on file  Occupational History  . Not on file  Tobacco Use  . Smoking status: Never Smoker  . Smokeless tobacco: Never Used  Vaping Use  . Vaping Use: Never used  Substance and Sexual Activity  . Alcohol use: Yes    Comment: occ  . Drug use: No  . Sexual activity: Yes    Birth control/protection: Post-menopausal  Other Topics Concern  . Not on file  Social History Narrative  . Not on file   Social Determinants of Health   Financial Resource Strain:   . Difficulty of Paying Living Expenses: Not on file  Food Insecurity:   . Worried About Programme researcher, broadcasting/film/video in the Last Year: Not on file  . Ran Out of Food in the Last Year: Not on file  Transportation Needs:   . Lack of Transportation (Medical): Not on file  . Lack of Transportation (Non-Medical): Not on file  Physical Activity:   . Days of Exercise per Week: Not on file  . Minutes of Exercise per Session: Not on file  Stress:   . Feeling of Stress : Not on file  Social Connections:   . Frequency of Communication with Friends and Family: Not on file  . Frequency of Social Gatherings with Friends and Family: Not on file  . Attends Religious Services: Not on file  . Active Member of Clubs or Organizations: Not on file  .  Attends Banker Meetings: Not on file  . Marital Status: Not on file  Intimate Partner Violence:   . Fear of Current or Ex-Partner: Not on file  . Emotionally Abused: Not on file  . Physically Abused: Not on file  . Sexually Abused: Not on file   Family History  Problem Relation Age of Onset  . Cancer Sister        cervical  . Cancer Maternal Grandmother        colon  . Crohn's disease Mother   . Leukemia Mother   . Hyperlipidemia Mother   . Thyroid disease Mother   . Stroke Mother   .  Diabetes Father   . Hypertension Father   . Hyperlipidemia Father   . Diabetes Sister     OBJECTIVE:  Vitals:   05/25/20 1529  BP: (!) 125/91  Pulse: (!) 101  Resp: 20  Temp: 97.9 F (36.6 C)  SpO2: 95%     General appearance: alert; appears fatigued, but nontoxic; speaking in full sentences and tolerating own secretions HEENT: NCAT; Ears: EACs clear, TMs pearly gray; Eyes: PERRL.  EOM grossly intact. Sinuses: nontender; Nose: nares patent without rhinorrhea, Throat: oropharynx clear, tonsils non erythematous or enlarged, uvula midline  Neck: supple without LAD Lungs: unlabored respirations, symmetrical air entry; cough: moderate; no respiratory distress; CTAB Heart: regular rate and rhythm.  Radial pulses 2+ symmetrical bilaterally Skin: warm and dry Psychological: alert and cooperative; normal mood and affect  LABS:  No results found for this or any previous visit (from the past 24 hour(s)).   ASSESSMENT & PLAN:  1. COVID-19 virus infection     Meds ordered this encounter  Medications  . dexamethasone (DECADRON) 4 MG tablet    Sig: Take 1 tablet (4 mg total) by mouth daily for 7 days.    Dispense:  7 tablet    Refill:  0  . benzonatate (TESSALON) 100 MG capsule    Sig: Take 1 capsule (100 mg total) by mouth every 8 (eight) hours.    Dispense:  21 capsule    Refill:  0    Discharge instructions  Get plenty of rest and push fluids Continue Zyrtec,  Tessalon Perles, Flonase as prescribed and directed  Decadron as prescribed/take as directed Follow-up with PCP Use OTC medications like ibuprofen or tylenol as needed fever or pain Call or go to the ED if you have any new or worsening symptoms such as fever, worsening cough, shortness of breath, chest tightness, chest pain, turning blue, changes in mental status, etc...   Reviewed expectations re: course of current medical issues. Questions answered. Outlined signs and symptoms indicating need for more acute intervention. Patient verbalized understanding. After Visit Summary given.         Durward Parcel, FNP 05/25/20 1635

## 2020-05-25 NOTE — ED Triage Notes (Signed)
Pt presents with worsening fatigue and fevers post covid infusion therapy last week. Spoke to infusion clinic and advised to be evaluated in person

## 2020-05-25 NOTE — Discharge Instructions (Addendum)
Get plenty of rest and push fluids Continue Zyrtec, Tessalon Perles, Flonase as prescribed and directed  Decadron as prescribed/take as directed Follow-up with PCP Use OTC medications like ibuprofen or tylenol as needed fever or pain Call or go to the ED if you have any new or worsening symptoms such as fever, worsening cough, shortness of breath, chest tightness, chest pain, turning blue, changes in mental status, etc..Marland Kitchen

## 2020-06-30 ENCOUNTER — Other Ambulatory Visit (HOSPITAL_COMMUNITY): Payer: Self-pay | Admitting: Adult Health

## 2020-06-30 DIAGNOSIS — Z1231 Encounter for screening mammogram for malignant neoplasm of breast: Secondary | ICD-10-CM

## 2020-08-17 ENCOUNTER — Other Ambulatory Visit: Payer: Self-pay

## 2020-08-17 ENCOUNTER — Ambulatory Visit (HOSPITAL_COMMUNITY)
Admission: RE | Admit: 2020-08-17 | Discharge: 2020-08-17 | Disposition: A | Discharge status: Home / Self Care | Payer: BC Managed Care – PPO | Source: Ambulatory Visit | Attending: Adult Health | Admitting: Adult Health

## 2020-08-17 DIAGNOSIS — Z1231 Encounter for screening mammogram for malignant neoplasm of breast: Secondary | ICD-10-CM | POA: Diagnosis present

## 2020-08-24 ENCOUNTER — Other Ambulatory Visit: Payer: Self-pay

## 2020-08-24 ENCOUNTER — Encounter: Payer: Self-pay | Admitting: Adult Health

## 2020-08-24 ENCOUNTER — Ambulatory Visit (INDEPENDENT_AMBULATORY_CARE_PROVIDER_SITE_OTHER): Payer: BC Managed Care – PPO | Admitting: Adult Health

## 2020-08-24 VITALS — BP 135/89 | HR 100 | Ht 63.0 in | Wt 173.5 lb

## 2020-08-24 DIAGNOSIS — I10 Essential (primary) hypertension: Secondary | ICD-10-CM

## 2020-08-24 DIAGNOSIS — Z79890 Hormone replacement therapy (postmenopausal): Secondary | ICD-10-CM

## 2020-08-24 DIAGNOSIS — Z1211 Encounter for screening for malignant neoplasm of colon: Secondary | ICD-10-CM | POA: Diagnosis not present

## 2020-08-24 DIAGNOSIS — Z01419 Encounter for gynecological examination (general) (routine) without abnormal findings: Secondary | ICD-10-CM | POA: Diagnosis not present

## 2020-08-24 LAB — HEMOCCULT GUIAC POC 1CARD (OFFICE): Fecal Occult Blood, POC: NEGATIVE

## 2020-08-24 MED ORDER — HYDROCHLOROTHIAZIDE 12.5 MG PO CAPS: 12.5000 mg | ORAL_CAPSULE | Freq: Every day | ORAL | 4 refills | Status: DC

## 2020-08-24 MED ORDER — PROGESTERONE 200 MG PO CAPS: 200.0000 mg | ORAL_CAPSULE | Freq: Every day | ORAL | 4 refills | Status: DC

## 2020-08-24 MED ORDER — ESTRADIOL 1 MG PO TABS: 1.0000 mg | ORAL_TABLET | Freq: Every day | ORAL | 4 refills | Status: DC

## 2020-08-24 NOTE — Progress Notes (Signed)
Patient ID: Melody Johnson, female   DOB: 12/13/1965, 55 y.o.   MRN: [id number] History of Present Illness: Melody Johnson is a 55 year old white female,married, PM in for a well woman gyn exam, she had a normal pap with negative HPV 08/23/2018. She declines COVID vaccine but did have COVID.  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   Current Medications, Allergies, Past Medical History, Past Surgical History, Family History and Social History were reviewed in [employer] record.     Review of Systems: Patient denies any headaches, hearing loss, fatigue, blurred vision, shortness of breath, chest pain, abdominal pain, problems with bowel movements, urination, or intercourse. No joint pain or mood swings. Denies any vaginal bleeding, happy with HRT    Physical Exam:BP 135/89 (BP Location: Left Arm, Patient Position: Sitting, Cuff Size: Normal)   Pulse 100   Ht 5\' 3"  (1.6 m)   Wt 173 lb 8 oz (78.7 kg)   LMP 10/18/2015   BMI 30.73 kg/m  General:  Well developed, well nourished, no acute distress Skin:  Warm and dry Neck:  Midline trachea, normal thyroid, good ROM, no lymphadenopathy Lungs; Clear to auscultation bilaterally Breast:  No dominant palpable mass, retraction, or nipple discharge Cardiovascular: Regular rate and rhythm Abdomen:  Soft, non tender, no hepatosplenomegaly Pelvic:  External genitalia is normal in appearance, no lesions.  The vagina is normal in appearance. Urethra has no lesions or masses. The cervix is bulbous.  Uterus is felt to be normal size, shape, and contour.  No adnexal masses or tenderness noted.Bladder is non tender, no masses felt. Rectal: Good sphincter tone, no polyps, or hemorrhoids felt.  Hemoccult negative. Extremities/musculoskeletal:  No swelling or varicosities noted, no clubbing or cyanosis Psych:  No mood changes, alert and cooperative,seems happy AA is 0  Fall risk is low PHQ 9 score is 6 GAD 7 score is 6  [facility] - 08/24/20 0839       Pregnancy Intention Screening   Does the patient want to become pregnant in the next year? N/A    Does the patient's partner want to become pregnant in the next year? N/A    Would the patient like to discuss contraceptive options today? N/A      Contraception Wrap Up   Current Method No Method - Other Reason   postmenopausal   End Method No Method - Other Reason   postmenopausal   Contraception Counseling Provided No         Examination chaperoned by 08/26/20 LPN  Impression and Plan: 1. Encounter for well woman exam with routine gynecological exam Pap and physical in 1 year Mammogram yearly Labs with PCP Colonoscopy 2024  2. Encounter for screening fecal occult blood testing   3. Postmenopausal HRT (hormone replacement therapy) Continue estrace and prometrium  Meds ordered this encounter  Medications  . progesterone (PROMETRIUM) 200 MG capsule    Sig: Take 1 capsule (200 mg total) by mouth at bedtime.    Dispense:  90 capsule    Refill:  4    Order Specific Question:   Supervising Provider    Answer:   2025, LUTHER H [[id number]]  . estradiol (ESTRACE) 1 MG tablet    Sig: Take 1 tablet (1 mg total) by mouth daily.    Dispense:  90 tablet    Refill:  4    Order Specific Question:   Supervising Provider    Answer:   Despina Hidden, LUTHER H [[id number]]  . hydrochlorothiazide (MICROZIDE) 12.5 MG  capsule    Sig: Take 1 capsule (12.5 mg total) by mouth daily.    Dispense:  90 capsule    Refill:  4    Order Specific Question:   Supervising Provider    Answer:   Despina Hidden, LUTHER H [[id number]]    4. Essential hypertension Continue microzide

## 2020-09-23 ENCOUNTER — Telehealth: Payer: Self-pay | Admitting: Adult Health

## 2020-09-23 MED ORDER — FLUCONAZOLE 150 MG PO TABS: ORAL_TABLET | ORAL | 1 refills | Status: DC

## 2020-09-23 NOTE — Telephone Encounter (Signed)
Pt has been on an antibiotic and has a yeast infection. She has tried Monistat with no help. Can you prescribe Diflucan? Thanks!! JSY

## 2020-09-23 NOTE — Telephone Encounter (Signed)
Patient called stating that she is having a yeast infection and would like for Victorino Dike to call her something in to the [facility] pharmacy on [address]. Please contact pt when sent.

## 2020-09-23 NOTE — Telephone Encounter (Signed)
Will rx diflucan  

## 2020-09-23 NOTE — Addendum Note (Signed)
Addended by: Cyril Mourning A on: 09/23/2020 12:13 PM   Modules accepted: Orders

## 2021-02-20 ENCOUNTER — Other Ambulatory Visit: Payer: Self-pay | Admitting: Obstetrics & Gynecology

## 2021-06-18 ENCOUNTER — Other Ambulatory Visit: Payer: Self-pay

## 2021-06-18 ENCOUNTER — Ambulatory Visit
Admission: EM | Admit: 2021-06-18 | Discharge: 2021-06-18 | Disposition: A | Discharge status: Home / Self Care | Payer: BC Managed Care – PPO | Attending: Physician Assistant | Admitting: Physician Assistant

## 2021-06-18 DIAGNOSIS — Z20828 Contact with and (suspected) exposure to other viral communicable diseases: Secondary | ICD-10-CM

## 2021-06-18 DIAGNOSIS — J069 Acute upper respiratory infection, unspecified: Secondary | ICD-10-CM

## 2021-06-18 NOTE — Discharge Instructions (Addendum)
Your flu and covid are pending 

## 2021-06-18 NOTE — ED Triage Notes (Signed)
Patient states she got up yesterday and had a scratchy throat and a headache. It quickly developed into body aches and a sore throat with heavy coughing.   Patient states she too Tylenol Cold and Flu without any relief.   She states she has had a fever of 102.0 yesterday.  Patient states she took a home covid tests and it negative.   Patient would like to be tested today for the flu.

## 2021-06-19 LAB — COVID-19, FLU A+B NAA
Influenza A, NAA: NOT DETECTED
Influenza B, NAA: NOT DETECTED
SARS-CoV-2, NAA: NOT DETECTED

## 2021-06-25 NOTE — ED Provider Notes (Signed)
[facility]    CSN: [id number] Arrival date & time: 06/18/21  0807      History   Chief Complaint Chief Complaint  Patient presents with   Sore Throat    Sore throat and a  cough    HPI Melody Johnson is a 54 y.o. female.   The history is provided by the patient. No language interpreter was used.  Sore Throat This is a new problem. The current episode started 12 to 24 hours ago. The problem occurs constantly. The problem has not changed since onset.Nothing aggravates the symptoms. She has tried nothing for the symptoms. The treatment provided no relief.   Past Medical History:  Diagnosis Date   Anxiety    Arthritis    Depression    Diabetes mellitus without complication ([place])    off meds after gastric bypass   GERD (gastroesophageal reflux disease)    High cholesterol    Hot flashes 12/24/2014   Hypertension    off meds after gastric bypass   Night sweats 12/24/2014   Obstructive sleep apnea    not using CPAP due to gastric bypass- not used for iver 2 yrs   PMB (postmenopausal bleeding) 12/25/2015   PONV (postoperative nausea and vomiting)    Postmenopausal HRT (hormone replacement therapy) 12/25/2015    Patient Active Problem List   Diagnosis Date Noted   Encounter for well woman exam with routine gynecological exam 08/24/2020   Encounter for screening fecal occult blood testing 08/24/2020   Screening for colorectal cancer 08/23/2018   Encounter for gynecological examination with Papanicolaou smear of cervix 08/23/2018   Hormone replacement therapy (HRT) 08/23/2018   PMB (postmenopausal bleeding) 12/25/2015   Postmenopausal HRT (hormone replacement therapy) 12/25/2015   Hot flashes 12/24/2014   Night sweats 12/24/2014   NAUSEA AND VOMITING 10/21/2009   OTHER DYSPHAGIA 10/21/2009   ANXIETY 03/06/2008   OBSESSIVE-COMPULSIVE DISORDER 03/06/2008   DEPRESSION 03/06/2008   Essential hypertension 03/06/2008   ESOPHAGITIS, REFLUX 03/06/2008   GERD  03/06/2008   GASTRITIS, ANTRAL 03/06/2008   HIATAL HERNIA 03/06/2008   IRRITABLE BOWEL SYNDROME 03/06/2008   ROSACEA 03/06/2008   DIARRHEA 03/06/2008   ALLERGY 03/06/2008    Past Surgical History:  Procedure Laterality Date   CATARACT EXTRACTION W/PHACO Left 07/24/2014   Procedure: CATARACT EXTRACTION PHACO AND INTRAOCULAR LENS PLACEMENT LEFT;  Surgeon: Tonny Branch, MD;  Location: [facility];  Service: Ophthalmology;  Laterality: Left;  CDE:1.82   CATARACT EXTRACTION W/PHACO Right 08/04/2014   Procedure: CATARACT EXTRACTION PHACO AND INTRAOCULAR LENS PLACEMENT RIGHT EYE CDE=1.23;  Surgeon: Tonny Branch, MD;  Location: [facility];  Service: Ophthalmology;  Laterality: Right;   CERVICAL SPINE SURGERY  2000   CHOLECYSTECTOMY  2002   GASTRIC ROUX-EN-Y  05/01/2012   Procedure: LAPAROSCOPIC ROUX-EN-Y GASTRIC BYPASS WITH UPPER ENDOSCOPY;  Surgeon: Madilyn Hook, DO;  Location: [facility];  Service: General;  Laterality: N/A;   LASIK  1996   LITHOTRIPSY  05/2011    OB History     Gravida  0   Para      Term      Preterm      AB      Living         SAB      IAB      Ectopic      Multiple      Live Births               Home Medications    Prior  to Admission medications   Medication Sig Start Date End Date Taking? Authorizing Provider  ALPRAZolam Duanne Moron) 0.5 MG tablet Take 0.5 mg by mouth 3 (three) times daily as needed for anxiety.    [provider]  Ascorbic Acid (VITAMIN C PO) Take by mouth.    [provider]  Beclomethasone Diprop, Nasal, (QNASL NA) Place into the nose at bedtime.    [provider]  Biotin w/ Vitamins C & E (HAIR/SKIN/NAILS PO) Take by mouth daily.    [provider]  Calcium Citrate-Vitamin D 200-250 MG-UNIT TABS Take by mouth daily.     [provider]  cetirizine (ZYRTEC ALLERGY) 10 MG tablet Take 1 tablet (10 mg total) by mouth daily. 05/16/20   Avegno, Darrelyn Hillock, FNP  Cholecalciferol (VITAMIN D3 PO) Take by  mouth.    [provider]  estradiol (ESTRACE) 1 MG tablet TAKE 1 TABLET BY MOUTH EVERY DAY 02/22/21   Florian Buff, MD  fluconazole (DIFLUCAN) 150 MG tablet Take 1 now and 1 in 3 days if needed 09/23/20   Estill Dooms, NP  hydrochlorothiazide (MICROZIDE) 12.5 MG capsule Take 1 capsule (12.5 mg total) by mouth daily. 08/24/20   Estill Dooms, NP  mirtazapine (REMERON) 30 MG tablet Take 30 mg by mouth at bedtime.    [provider]  montelukast (SINGULAIR) 10 MG tablet TAKE (1) TABLET BY MOUTH AT BEDTIME. 03/14/17   Valentina Shaggy, MD  Multiple Vitamin (MULTIVITAMIN) tablet Take by mouth daily. Freedavite Multi Vit    [provider]  Multiple Vitamins-Minerals (OCUVITE PO) Take by mouth daily.    [provider]  Multiple Vitamins-Minerals (ZINC PO) Take by mouth.    [provider]  omeprazole (PRILOSEC) 20 MG capsule Take 20 mg by mouth every morning.    [provider]  progesterone (PROMETRIUM) 200 MG capsule Take 1 capsule (200 mg total) by mouth at bedtime. 08/24/20   Estill Dooms, NP  RESTASIS MULTIDOSE 0.05 % ophthalmic emulsion  01/26/16   [provider]    Family History Family History  Problem Relation Age of Onset   Cancer Sister        cervical   Cancer Maternal Grandmother        colon   Crohn's disease Mother    Leukemia Mother    Hyperlipidemia Mother    Thyroid disease Mother    Stroke Mother    Diabetes Father    Hypertension Father    Hyperlipidemia Father    Diabetes Sister     Social History Social History   Tobacco Use   Smoking status: Never   Smokeless tobacco: Never  Vaping Use   Vaping Use: Never used  Substance Use Topics   Alcohol use: Not Currently    Comment: occ   Drug use: No     Allergies   Bacitracin-polymyxin b, Codeine, Neomycin-bacitracin zn-polymyx, Penicillins, Neomycin-polymyxin-gramicidin, Roxicet [oxycodone-acetaminophen], Sulfonamide  derivatives, Penicillin g, and Sulfamethoxazole   Review of Systems Review of Systems  All other systems reviewed and are negative.   Physical Exam Triage Vital Signs ED Triage Vitals  Enc Vitals Group     BP 06/18/21 0845 (!) 133/93     Pulse Rate 06/18/21 0845 (!) 106     Resp 06/18/21 0845 16     Temp 06/18/21 0845 99.6 F (37.6 C)     Temp Source 06/18/21 0845 Oral     SpO2 06/18/21 0845 95 %  Weight --      Height --      Head Circumference --      Peak Flow --      Pain Score 06/18/21 0842 3     Pain Loc --      Pain Edu? --      Excl. in GC? --    No data found.  Updated Vital Signs BP (!) 133/93 (BP Location: Right Arm) Comment: Patient has not taken blood pressure meds this morning   Pulse (!) 106    Temp 99.6 F (37.6 C) (Oral)    Resp 16    LMP 10/18/2015    SpO2 95%   Visual Acuity Right Eye Distance:   Left Eye Distance:   Bilateral Distance:    Right Eye Near:   Left Eye Near:    Bilateral Near:     Physical Exam Vitals and nursing note reviewed.  Constitutional:      Appearance: She is well-developed.  HENT:     Head: Normocephalic.  Pulmonary:     Effort: Pulmonary effort is normal.  Abdominal:     General: There is no distension.  Musculoskeletal:        General: Normal range of motion.     Cervical back: Normal range of motion.  Neurological:     Mental Status: She is alert and oriented to person, place, and time.     UC Treatments / Results  Labs (all labs ordered are listed, but only abnormal results are displayed) Labs Reviewed  COVID-19, FLU A+B NAA   Narrative:    Performed at:  [address], [place], [place]  [id number] Lab Director: Jolene Schimke MD, Phone:  [phone]    EKG   Radiology No results found.  Procedures Procedures (including critical care time)  Medications Ordered in UC Medications - No data to display  Initial Impression / Assessment and Plan / UC Course  I have  reviewed the triage vital signs and the nursing notes.  Pertinent labs & imaging results that were available during my care of the patient were reviewed by me and considered in my medical decision making (see chart for details).     MDM:  covid and influenza pending  Final Clinical Impressions(s) / UC Diagnoses   Final diagnoses:  Exposure to the flu  Viral URI     Discharge Instructions      Your flu and covid are pending   ED Prescriptions   None    PDMP not reviewed this encounter. An After Visit Summary was printed and given to the patient.    Elson Areas, [place] 06/25/21 1914

## 2021-07-21 ENCOUNTER — Other Ambulatory Visit (HOSPITAL_COMMUNITY): Payer: Self-pay | Admitting: Physician Assistant

## 2021-07-21 DIAGNOSIS — Z1231 Encounter for screening mammogram for malignant neoplasm of breast: Secondary | ICD-10-CM

## 2021-08-19 ENCOUNTER — Ambulatory Visit (HOSPITAL_COMMUNITY)
Admission: RE | Admit: 2021-08-19 | Discharge: 2021-08-19 | Disposition: A | Discharge status: Home / Self Care | Payer: BC Managed Care – PPO | Source: Ambulatory Visit | Attending: Physician Assistant | Admitting: Physician Assistant

## 2021-08-19 ENCOUNTER — Other Ambulatory Visit: Payer: Self-pay

## 2021-08-19 DIAGNOSIS — Z1231 Encounter for screening mammogram for malignant neoplasm of breast: Secondary | ICD-10-CM | POA: Insufficient documentation

## 2021-08-26 ENCOUNTER — Other Ambulatory Visit (HOSPITAL_COMMUNITY)
Admission: RE | Admit: 2021-08-26 | Discharge: 2021-08-26 | Disposition: A | Discharge status: Home / Self Care | Payer: BC Managed Care – PPO | Source: Ambulatory Visit | Attending: Adult Health | Admitting: Adult Health

## 2021-08-26 ENCOUNTER — Encounter: Payer: Self-pay | Admitting: Adult Health

## 2021-08-26 ENCOUNTER — Ambulatory Visit (INDEPENDENT_AMBULATORY_CARE_PROVIDER_SITE_OTHER): Payer: BC Managed Care – PPO | Admitting: Adult Health

## 2021-08-26 ENCOUNTER — Other Ambulatory Visit: Payer: Self-pay

## 2021-08-26 VITALS — BP 126/86 | HR 88 | Ht 63.0 in | Wt 172.0 lb

## 2021-08-26 DIAGNOSIS — Z1211 Encounter for screening for malignant neoplasm of colon: Secondary | ICD-10-CM | POA: Diagnosis not present

## 2021-08-26 DIAGNOSIS — Z01419 Encounter for gynecological examination (general) (routine) without abnormal findings: Secondary | ICD-10-CM | POA: Diagnosis not present

## 2021-08-26 DIAGNOSIS — Z79890 Hormone replacement therapy (postmenopausal): Secondary | ICD-10-CM

## 2021-08-26 DIAGNOSIS — N941 Unspecified dyspareunia: Secondary | ICD-10-CM

## 2021-08-26 DIAGNOSIS — N950 Postmenopausal bleeding: Secondary | ICD-10-CM | POA: Diagnosis not present

## 2021-08-26 DIAGNOSIS — N951 Menopausal and female climacteric states: Secondary | ICD-10-CM

## 2021-08-26 DIAGNOSIS — N930 Postcoital and contact bleeding: Secondary | ICD-10-CM

## 2021-08-26 LAB — HEMOCCULT GUIAC POC 1CARD (OFFICE): Fecal Occult Blood, POC: NEGATIVE

## 2021-08-26 MED ORDER — PREMARIN 0.625 MG/GM VA CREA: TOPICAL_CREAM | VAGINAL | 3 refills | Status: DC

## 2021-08-26 MED ORDER — PROGESTERONE 200 MG PO CAPS: 200.0000 mg | ORAL_CAPSULE | Freq: Every day | ORAL | 4 refills | Status: DC

## 2021-08-26 MED ORDER — ESTRADIOL 1 MG PO TABS: 1.0000 mg | ORAL_TABLET | Freq: Every day | ORAL | 4 refills | Status: DC

## 2021-08-26 NOTE — Progress Notes (Signed)
Patient ID: Melody Johnson, female   DOB: [birth year], 56 y.o.   MRN: [id number] ?History of Present Illness: ?Melody Johnson is a 56 year old white female, married, PM in for a well woman gyn exam and pap. ?She is having pain and bleeding after sex. ?She is on estrace and Prometrium, hot flashes good. ?She is stress over son, he is senior, and is having depression,anxiety and panic attacks.  ?PCP is [facility] [place]  ? ? ?Current Medications, Allergies, Past Medical History, Past Surgical History, Family History and Social History were reviewed in [employer] record.   ? ? ?Review of Systems: ?Patient denies any headaches, hearing loss, fatigue, blurred vision, shortness of breath, chest pain, abdominal pain, problems with bowel movements, urination. No joint pain or mood swings.  ? ? ? ?Physical Exam:BP 126/86 (BP Location: Right Arm, Patient Position: Sitting, Cuff Size: Normal)   Pulse 88   Ht 5\' 3"  (1.6 m)   Wt 172 lb (78 kg)   LMP 10/18/2015   BMI 30.47 kg/m?   ?General:  Well developed, well nourished, no acute distress ?Skin:  Warm and dry ?Neck:  Midline trachea, normal thyroid, good ROM, no lymphadenopathy ?Lungs; Clear to auscultation bilaterally ?Breast:  No dominant palpable mass, retraction, or nipple discharge ?Cardiovascular: Regular rate and rhythm ?Abdomen:  Soft, non tender, no hepatosplenomegaly ?Pelvic:  External genitalia is normal in appearance, no lesions.  The vagina is pale and dry. Urethra has no lesions or masses. The cervix is smooth, pap with HR HPV genotyping performed.  Uterus is felt to be normal size, shape, and contour.  No adnexal masses or tenderness noted.Bladder is non tender, no masses felt. ?Rectal: Good sphincter tone, no polyps, or hemorrhoids felt.  Hemoccult negative. ?Extremities/musculoskeletal:  No swelling or varicosities noted, no clubbing or cyanosis ?Psych:  No mood changes, alert and cooperative,seems happy ?AA is 0 ?Fall risk  is low ?Depression screen [facility] 2/9 08/26/2021 08/24/2020 08/23/2018  ?Decreased Interest 1 1 0  ?Down, Depressed, Hopeless 1 1 0  ?PHQ - 2 Score 2 2 0  ?Altered sleeping 1 0 -  ?Tired, decreased energy 1 1 -  ?Change in appetite 0 1 -  ?Feeling bad or failure about yourself  1 1 -  ?Trouble concentrating 1 1 -  ?Moving slowly or fidgety/restless 0 0 -  ?Suicidal thoughts 0 0 -  ?PHQ-9 Score 6 6 -  ?  ?GAD 7 : Generalized Anxiety Score 08/26/2021 08/24/2020  ?Nervous, Anxious, on Edge 2 1  ?Control/stop worrying 2 0  ?Worry too much - different things 2 1  ?Trouble relaxing 2 1  ?Restless 2 1  ?Easily annoyed or irritable 2 1  ?Afraid - awful might happen 0 1  ?Total GAD 7 Score 12 6  ?She has xanax for prn but encouraged to call PCP for maybe something more  ?  ? [facility] - 08/26/21 0856   ? ?  ? Pregnancy Intention Screening  ? Does the patient want to become pregnant in the next year? N/A   ? Does the patient's partner want to become pregnant in the next year? N/A   ? Would the patient like to discuss contraceptive options today? N/A   ?  ? Contraception Wrap Up  ? Current Method --   PM  ? End Method --   PM  ? Contraception Counseling Provided No   ? ?  ?  ? ?  ? Examination chaperoned by 10/26/21 LPN ? ? ?  Impression and Plan: ?1. Encounter for gynecological examination with Papanicolaou smear of cervix ?Physical in 1 year ?Pap sent ?Pap in 3 if normal ?Mammogram year;y ?Labs with PCP ?Colonoscopy per GI  ? ?2. Encounter for screening fecal occult blood testing ? ?3. Postmenopausal HRT (hormone replacement therapy) ?Continue estrace and prometrium ?Meds ordered this encounter  ?Medications  ? estradiol (ESTRACE) 1 MG tablet  ?  Sig: Take 1 tablet (1 mg total) by mouth daily.  ?  Dispense:  90 tablet  ?  Refill:  4  ?  Order Specific Question:   Supervising Provider  ?  Answer:   Duane Lope H [[id number]]  ? progesterone (PROMETRIUM) 200 MG capsule  ?  Sig: Take 1 capsule (200 mg total) by mouth at bedtime.  ?   Dispense:  90 capsule  ?  Refill:  4  ?  Order Specific Question:   Supervising Provider  ?  Answer:   Duane Lope H [[id number]]  ? conjugated estrogens (PREMARIN) vaginal cream  ?  Sig: Use 1 gm in vagina at bedtime for 2 weeks then 2-3 x weekly  ?  Dispense:  30 g  ?  Refill:  3  ?  Order Specific Question:   Supervising Provider  ?  Answer:   Duane Lope H [[id number]]  ?  ? ?4. Postmenopausal postcoital bleeding ?Will try PVC and good lubricate on self and partner  ? ?5. Dyspareunia in female ?Will try PVC ? ?6. Menopausal vaginal dryness ?Will add premarin vaginal cream, and use good lubricate ?Discount card given  ? ? ?  ?  ?

## 2021-08-30 LAB — CYTOLOGY - PAP
Adequacy: ABSENT
Comment: NEGATIVE
Diagnosis: NEGATIVE
High risk HPV: NEGATIVE

## 2021-09-15 ENCOUNTER — Other Ambulatory Visit: Payer: Self-pay | Admitting: Adult Health

## 2022-05-25 ENCOUNTER — Other Ambulatory Visit (HOSPITAL_COMMUNITY): Payer: Self-pay | Admitting: Physician Assistant

## 2022-05-25 DIAGNOSIS — N644 Mastodynia: Secondary | ICD-10-CM

## 2022-05-25 LAB — LIPID PANEL
LDL Cholesterol: 94
Triglycerides: 110 (ref 40–160)

## 2022-05-25 LAB — HEMOGLOBIN A1C: Hemoglobin A1C: 6

## 2022-06-09 ENCOUNTER — Ambulatory Visit (HOSPITAL_COMMUNITY)
Admission: RE | Admit: 2022-06-09 | Discharge: 2022-06-09 | Disposition: A | Discharge status: Home / Self Care | Payer: BC Managed Care – PPO | Source: Ambulatory Visit | Attending: Physician Assistant | Admitting: Physician Assistant

## 2022-06-09 DIAGNOSIS — N644 Mastodynia: Secondary | ICD-10-CM | POA: Diagnosis present

## 2022-07-14 ENCOUNTER — Other Ambulatory Visit (HOSPITAL_COMMUNITY): Payer: Self-pay | Admitting: Physician Assistant

## 2022-07-14 DIAGNOSIS — Z1231 Encounter for screening mammogram for malignant neoplasm of breast: Secondary | ICD-10-CM

## 2022-08-17 IMAGING — MG MM DIGITAL SCREENING BILAT W/ TOMO AND CAD
8 series · 8 of 24 positions shown · non-contrast
Comparison: Previous exam(s).

CLINICAL DATA: Screening.

EXAM:
DIGITAL SCREENING BILATERAL MAMMOGRAM WITH TOMOSYNTHESIS AND CAD
TECHNIQUE: Bilateral screening digital craniocaudal and mediolateral oblique
mammograms were obtained. Bilateral screening digital breast
tomosynthesis was performed. The images were evaluated with
computer-aided detection.

[R MLO synth-2D]
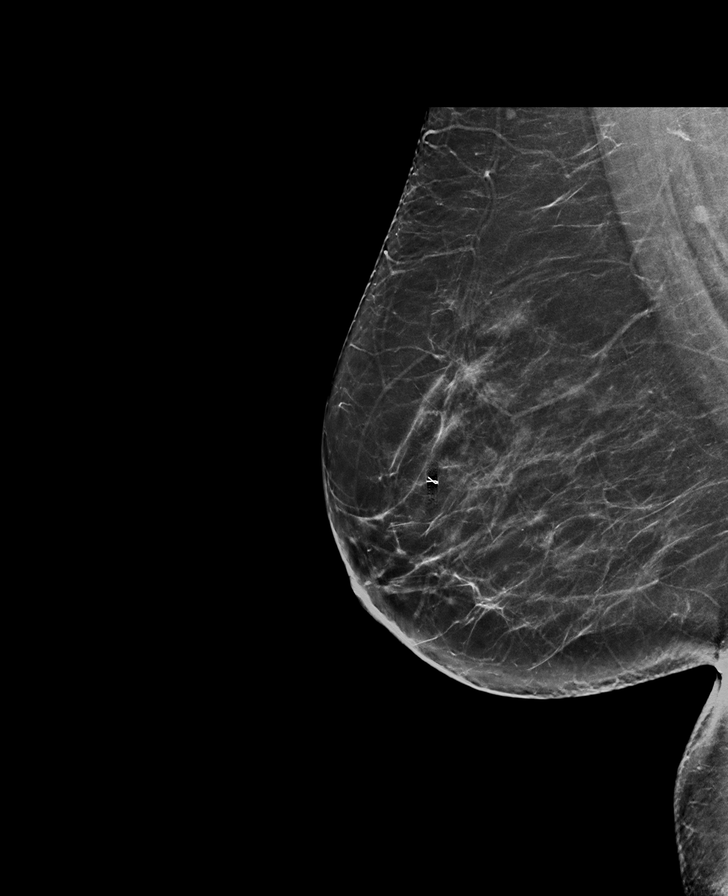

[L MLO synth-2D]
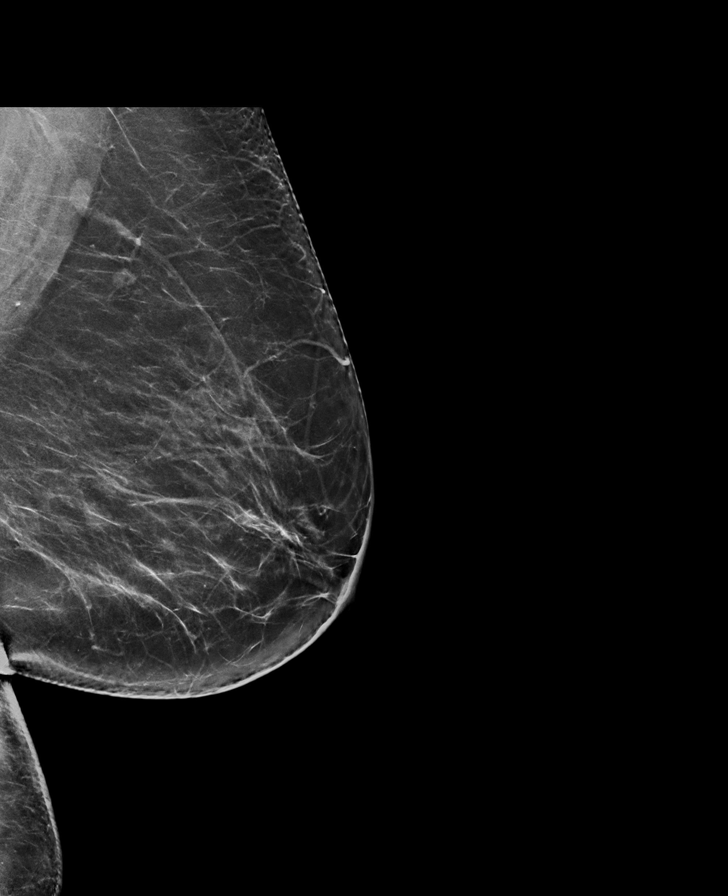

[L CC synth-2D]
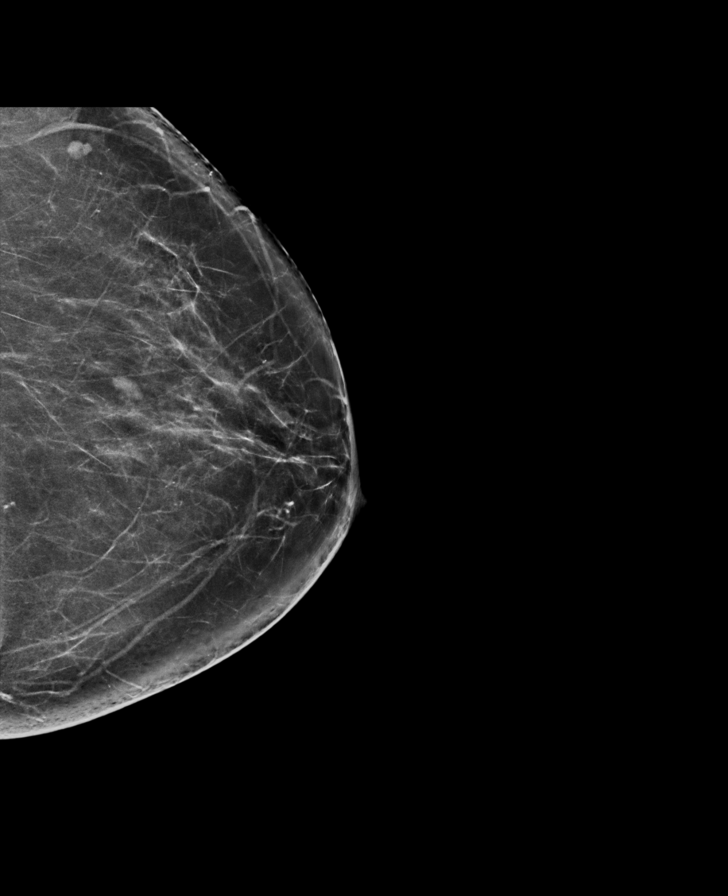

[R CC synth-2D]
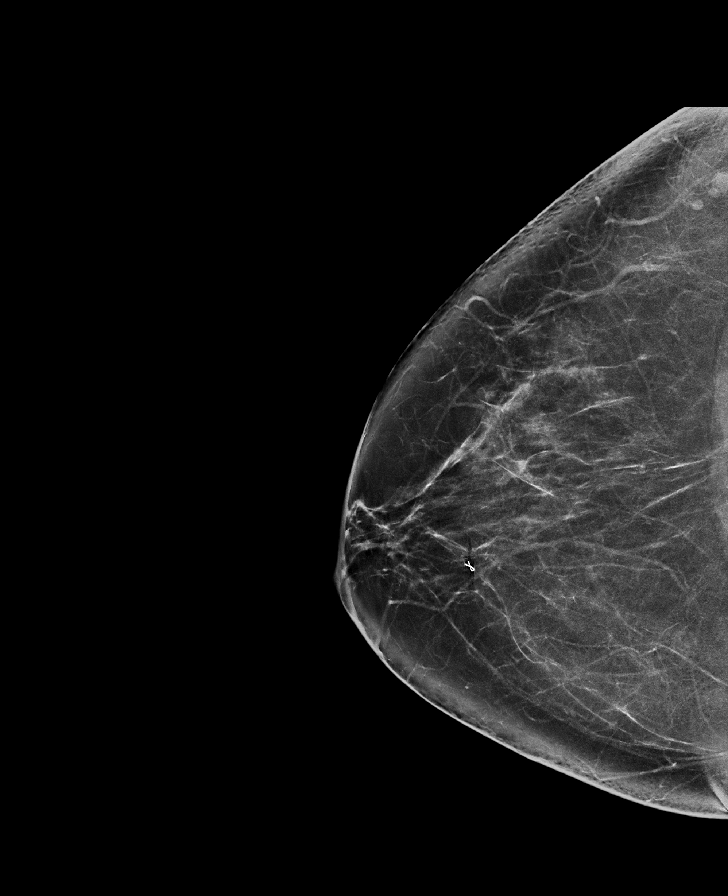

[R CC tomo · tomo slice 40/79.0]
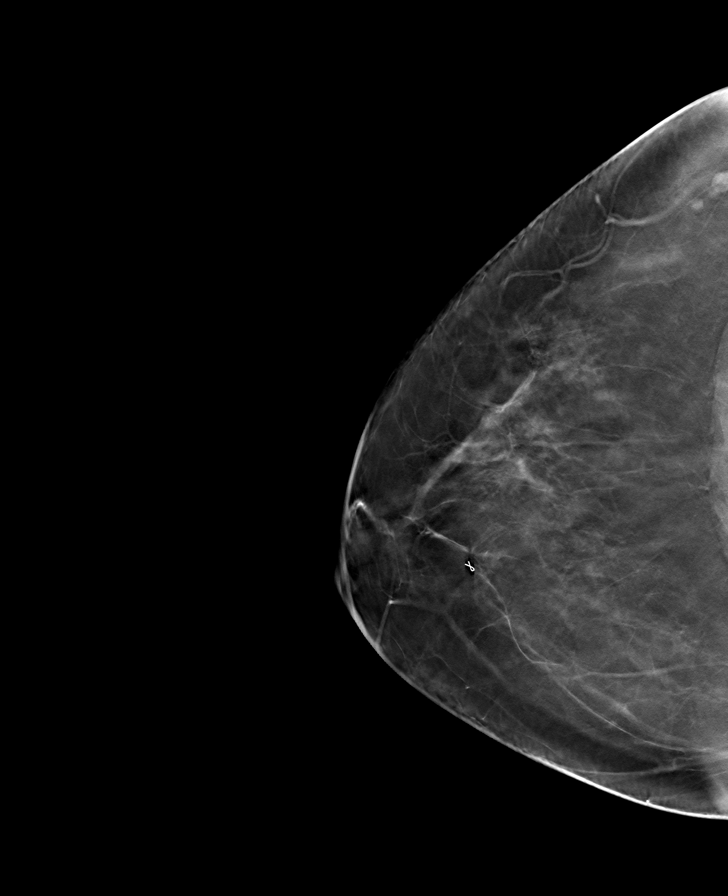

[R MLO tomo · tomo slice 39/77.0]
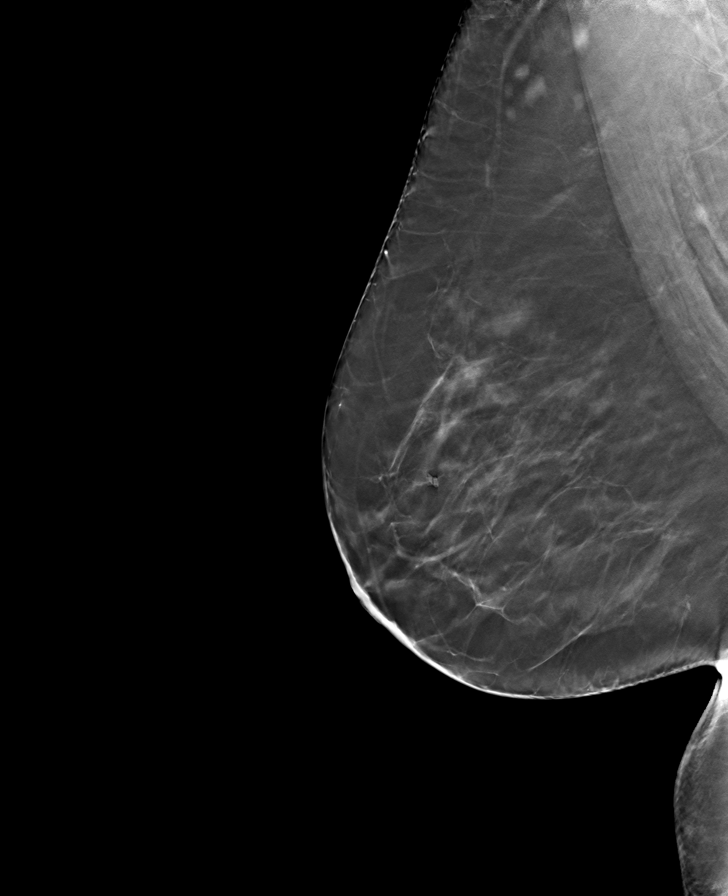

[L CC tomo · tomo slice 38/75.0]
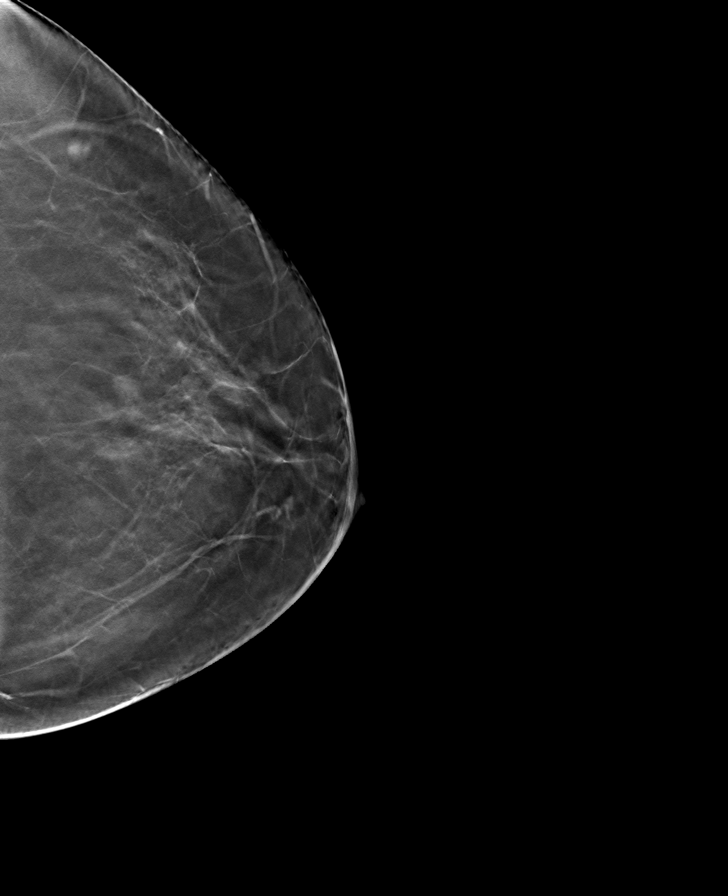

[L MLO tomo · tomo slice 39/78.0]
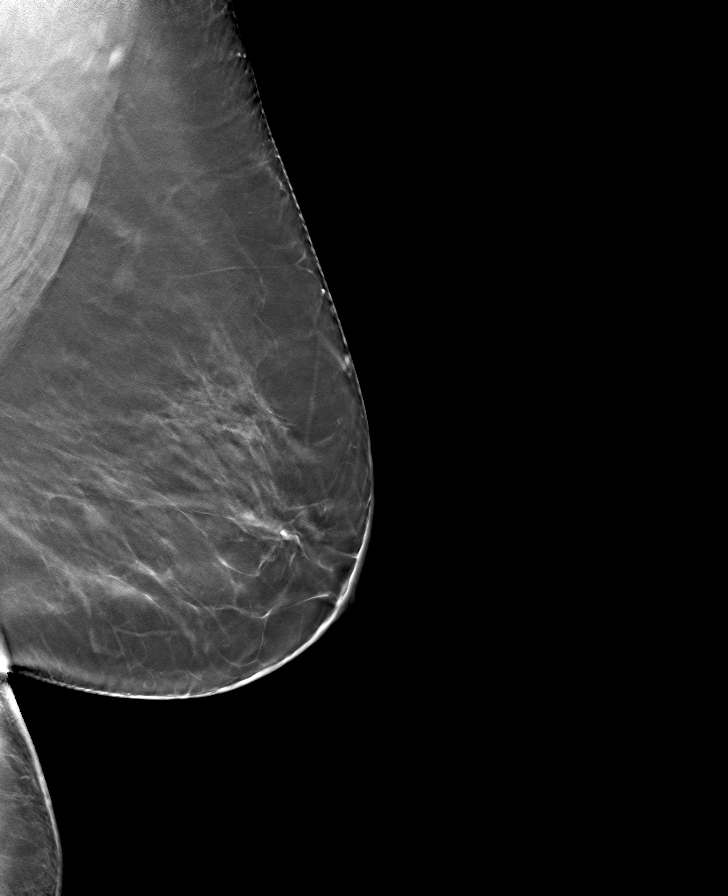

[8 of 24 positions shown; findings below may reference images not displayed]

ACR Breast Density Category b: There are scattered areas of
fibroglandular density.
FINDINGS: There are no findings suspicious for malignancy.
IMPRESSION: No mammographic evidence of malignancy. A result letter of this
screening mammogram will be mailed directly to the patient.

RECOMMENDATION:
Screening mammogram in one year. (Code:51-O-LD2)

BI-RADS CATEGORY  1: Negative.

## 2022-08-22 ENCOUNTER — Ambulatory Visit (HOSPITAL_COMMUNITY)
Admission: RE | Admit: 2022-08-22 | Discharge: 2022-08-22 | Disposition: A | Discharge status: Home / Self Care | Payer: BC Managed Care – PPO | Source: Ambulatory Visit | Attending: Physician Assistant | Admitting: Physician Assistant

## 2022-08-22 DIAGNOSIS — Z1231 Encounter for screening mammogram for malignant neoplasm of breast: Secondary | ICD-10-CM | POA: Insufficient documentation

## 2022-09-08 ENCOUNTER — Encounter: Payer: Self-pay | Admitting: Adult Health

## 2022-09-08 ENCOUNTER — Ambulatory Visit (INDEPENDENT_AMBULATORY_CARE_PROVIDER_SITE_OTHER): Payer: BC Managed Care – PPO | Admitting: Adult Health

## 2022-09-08 VITALS — BP 129/87 | HR 99 | Ht 63.0 in | Wt 176.5 lb

## 2022-09-08 DIAGNOSIS — Z79890 Hormone replacement therapy (postmenopausal): Secondary | ICD-10-CM

## 2022-09-08 DIAGNOSIS — Z1211 Encounter for screening for malignant neoplasm of colon: Secondary | ICD-10-CM

## 2022-09-08 DIAGNOSIS — Z01419 Encounter for gynecological examination (general) (routine) without abnormal findings: Secondary | ICD-10-CM

## 2022-09-08 LAB — HEMOCCULT GUIAC POC 1CARD (OFFICE): Fecal Occult Blood, POC: NEGATIVE

## 2022-09-08 MED ORDER — PREMARIN 0.625 MG/GM VA CREA: TOPICAL_CREAM | VAGINAL | 3 refills | Status: DC

## 2022-09-08 MED ORDER — ESTRADIOL 1 MG PO TABS: 1.0000 mg | ORAL_TABLET | Freq: Every day | ORAL | 4 refills | Status: DC

## 2022-09-08 MED ORDER — PROGESTERONE 200 MG PO CAPS: 200.0000 mg | ORAL_CAPSULE | Freq: Every day | ORAL | 4 refills | Status: DC

## 2022-09-08 NOTE — Progress Notes (Signed)
Patient ID: Melody Johnson, female   DOB: 24-Feb-1966, 57 y.o.   MRN: [id number] History of Present Illness: Melody Johnson is a 57 year old white female,married, PM in for a well woman gyn exam. She is on HRT. Has vaginal dryness due to not using PVC in a while but has 2 tubes now.  Has had problems with teeth, and now dealing with jaw bone issues.  Last pap was negative HPV and NILM 08/26/21.  PCP is [facility] in [place]   Current Medications, Allergies, Past Medical History, Past Surgical History, Family History and Social History were reviewed in [employer] record.     Review of Systems: Patient denies any headaches, hearing loss, fatigue, blurred vision, shortness of breath, chest pain, abdominal pain, problems with bowel movements, urination, or intercourse. No joint pain or mood swings.  Vaginal dryness   Physical Exam:BP 129/87 (BP Location: Right Arm, Patient Position: Sitting, Cuff Size: Normal)   Pulse 99   Ht '5\' 3"'$  (1.6 m)   Wt 176 lb 8 oz (80.1 kg)   LMP 10/18/2015   BMI 31.27 kg/m   General:  Well developed, well nourished, no acute distress Skin:  Warm and dry Neck:  Midline trachea, normal thyroid, good ROM, no lymphadenopathy Lungs; Clear to auscultation bilaterally Breast:  No dominant palpable mass, retraction, or nipple discharge Cardiovascular: Regular rate and rhythm Abdomen:  Soft, non tender, no hepatosplenomegaly Pelvic:  External genitalia is normal in appearance, no lesions.  The vagina is pale.  Urethra has no lesions or masses. The cervix is smooth.  Uterus is felt to be normal size, shape, and contour.  No adnexal masses or tenderness noted.Bladder is non tender, no masses felt. Rectal: Good sphincter tone, no polyps, + hemorrhoids felt.  Hemoccult negative. Extremities/musculoskeletal:  No swelling or varicosities noted, no clubbing or cyanosis Psych:  No mood changes, alert and cooperative,seems happy AA is 1 Fall risk is low     09/08/2022    8:53 AM 08/26/2021    8:45 AM 08/24/2020    8:39 AM  Depression screen PHQ 2/9  Decreased Interest '1 1 1  '$ Down, Depressed, Hopeless '1 1 1  '$ PHQ - 2 Score '2 2 2  '$ Altered sleeping 0 1 0  Tired, decreased energy '2 1 1  '$ Change in appetite 1 0 1  Feeling bad or failure about yourself  0 1 1  Trouble concentrating 0 1 1  Moving slowly or fidgety/restless 0 0 0  Suicidal thoughts 0 0 0  PHQ-9 Score '5 6 6       '$ 09/08/2022    8:53 AM 08/26/2021    8:45 AM 08/24/2020    8:39 AM  GAD 7 : Generalized Anxiety Score  Nervous, Anxious, on Edge '1 2 1  '$ Control/stop worrying 0 2 0  Worry too much - different things '1 2 1  '$ Trouble relaxing '2 2 1  '$ Restless 0 2 1  Easily annoyed or irritable 0 2 1  Afraid - awful might happen 1 0 1  Total GAD 7 Score '5 12 6      '$ [facility] - 09/08/22 0911       Pregnancy Intention Screening   Does the patient want to become pregnant in the next year? N/A    Does the patient's partner want to become pregnant in the next year? N/A    Would the patient like to discuss contraceptive options today? N/A      Contraception Wrap Up   Current  Method No Method - Other Reason   postmenopausal   Reason for No Current Contraceptive Method at Intake ([facility] Only) Other    End Method No Method - Other Reason   postmenopausal   Contraception Counseling Provided No              Examination chaperoned by Levy Pupa LPN   Impression and Plan: 1. Encounter for well woman exam with routine gynecological exam Physical in 1 year Pap in 2026 Mammogram was negative 08/22/22 Colonoscopy in September 2024 Labs with PCP Stay active   2. Encounter for screening fecal occult blood testing Hemoccult was negative   3. Postmenopausal HRT (hormone replacement therapy) Will refill estrace, Prometrium and PVC Meds ordered this encounter  Medications   progesterone (PROMETRIUM) 200 MG capsule    Sig: Take 1 capsule (200 mg total) by mouth daily.    Dispense:  90  capsule    Refill:  4    Order Specific Question:   Supervising Provider    Answer:   Tania Ade H [[id number]]   estradiol (ESTRACE) 1 MG tablet    Sig: Take 1 tablet (1 mg total) by mouth daily.    Dispense:  90 tablet    Refill:  4    Order Specific Question:   Supervising Provider    Answer:   Tania Ade H [[id number]]   conjugated estrogens (PREMARIN) vaginal cream    Sig: Use 1 gm in vagina at bedtime for 2 weeks then 2-3 x weekly    Dispense:  30 g    Refill:  3    Order Specific Question:   Supervising Provider    Answer:   Tania Ade H [[id number]]

## 2022-11-29 LAB — BASIC METABOLIC PANEL WITH GFR
BUN: 20 (ref 4–21)
Creatinine: 0.8 (ref 0.5–1.1)
Glucose: 117

## 2022-11-29 LAB — HEMOGLOBIN A1C: Hemoglobin A1C: 6.8

## 2022-11-29 LAB — COMPREHENSIVE METABOLIC PANEL WITH GFR: eGFR: >90

## 2023-03-15 ENCOUNTER — Encounter: Payer: Self-pay | Admitting: Nurse Practitioner

## 2023-03-15 ENCOUNTER — Ambulatory Visit (INDEPENDENT_AMBULATORY_CARE_PROVIDER_SITE_OTHER): Payer: BC Managed Care – PPO | Admitting: Nurse Practitioner

## 2023-03-15 VITALS — BP 126/89 | HR 108 | Ht 63.0 in | Wt 182.0 lb

## 2023-03-15 DIAGNOSIS — I10 Essential (primary) hypertension: Secondary | ICD-10-CM | POA: Diagnosis not present

## 2023-03-15 DIAGNOSIS — E119 Type 2 diabetes mellitus without complications: Secondary | ICD-10-CM | POA: Diagnosis not present

## 2023-03-15 DIAGNOSIS — Z7984 Long term (current) use of oral hypoglycemic drugs: Secondary | ICD-10-CM | POA: Diagnosis not present

## 2023-03-15 DIAGNOSIS — E782 Mixed hyperlipidemia: Secondary | ICD-10-CM

## 2023-03-15 LAB — POCT GLYCOSYLATED HEMOGLOBIN (HGB A1C): Hemoglobin A1C: 7.9 % — AB (ref 4.0–5.6)

## 2023-03-15 NOTE — Patient Instructions (Signed)

## 2023-03-15 NOTE — Progress Notes (Signed)
Endocrinology Consult Note       03/15/2023, 12:12 PM   Subjective:    Patient ID: Melody Johnson, female    DOB: 23-Jun-1966.  Melody Johnson is being seen in consultation for management of currently uncontrolled symptomatic diabetes requested by  Melody Luster, PA-C.   Past Medical History:  Diagnosis Date   Anxiety    Arthritis    Depression    Diabetes mellitus without complication (HCC)    off meds after gastric bypass   GERD (gastroesophageal reflux disease)    High cholesterol    Hot flashes 12/24/2014   Hypertension    off meds after gastric bypass   Night sweats 12/24/2014   Obstructive sleep apnea    not using CPAP due to gastric bypass- not used for iver 2 yrs   PMB (postmenopausal bleeding) 12/25/2015   PONV (postoperative nausea and vomiting)    Postmenopausal HRT (hormone replacement therapy) 12/25/2015    Past Surgical History:  Procedure Laterality Date   CATARACT EXTRACTION W/PHACO Left 07/24/2014   Procedure: CATARACT EXTRACTION PHACO AND INTRAOCULAR LENS PLACEMENT LEFT;  Surgeon: Gemma Payor, MD;  Location: [facility];  Service: Ophthalmology;  Laterality: Left;  CDE:1.82   CATARACT EXTRACTION W/PHACO Right 08/04/2014   Procedure: CATARACT EXTRACTION PHACO AND INTRAOCULAR LENS PLACEMENT RIGHT EYE CDE=1.23;  Surgeon: Gemma Payor, MD;  Location: [facility];  Service: Ophthalmology;  Laterality: Right;   CERVICAL SPINE SURGERY  2000   CHOLECYSTECTOMY  2002   GASTRIC ROUX-EN-Y  05/01/2012   Procedure: LAPAROSCOPIC ROUX-EN-Y GASTRIC BYPASS WITH UPPER ENDOSCOPY;  Surgeon: Lodema Pilot, DO;  Location: [facility];  Service: General;  Laterality: N/A;   LASIK  1996   LITHOTRIPSY  05/2011    Social History   Socioeconomic History   Marital status: Married    Spouse name: Not on file   Number of children: Not on file   Years of education: Not on file   Highest education level: Not on file  Occupational  History   Not on file  Tobacco Use   Smoking status: Never   Smokeless tobacco: Never  Vaping Use   Vaping status: Never Used  Substance and Sexual Activity   Alcohol use: Yes    Comment: occ   Drug use: No   Sexual activity: Yes    Birth control/protection: Post-menopausal  Other Topics Concern   Not on file  Social History Narrative   Not on file   Social Determinants of Health   Financial Resource Strain: Medium Risk (09/08/2022)   Overall Financial Resource Strain (CARDIA)    Difficulty of Paying Living Expenses: Somewhat hard  Food Insecurity: Low Risk  (03/01/2023)   Received from [facility]   Hunger Vital Sign    Worried About Running Out of Food in the Last Year: Never true    Ran Out of Food in the Last Year: Never true  Transportation Needs: No Transportation Needs (03/01/2023)   Received from [facility]    In the past 12 months, has lack of reliable transportation kept you from medical appointments, meetings, work or from getting things needed for daily living? : No  Physical Activity: Insufficiently  Active (09/08/2022)   Exercise Vital Sign    Days of Exercise per Week: 3 days    Minutes of Exercise per Session: 30 min  Stress: Stress Concern Present (09/08/2022)   [employer] of Occupational Health - Occupational Stress Questionnaire    Feeling of Stress : To some extent  Social Connections: Moderately Integrated (09/08/2022)   Social Connection and Isolation Panel [NHANES]    Frequency of Communication with Friends and Family: More than three times a week    Frequency of Social Gatherings with Friends and Family: Once a week    Attends Religious Services: 1 to 4 times per year    Active Member of [employer] or Organizations: No    Attends Banker Meetings: Never    Marital Status: Married    Family History  Problem Relation Age of Onset   Cancer Sister        cervical   Cancer Maternal Grandmother        colon   Crohn's  disease Mother    Leukemia Mother    Hyperlipidemia Mother    Thyroid disease Mother    Stroke Mother    Diabetes Father    Hypertension Father    Hyperlipidemia Father    Diabetes Sister     Outpatient Encounter Medications as of 03/15/2023  Medication Sig   ALPRAZolam (XANAX) 0.5 MG tablet Take 0.5 mg by mouth 3 (three) times daily as needed for anxiety.   atorvastatin (LIPITOR) 10 MG tablet Take 10 mg by mouth daily.   busPIRone (BUSPAR) 15 MG tablet Take 30 mg by mouth 2 (two) times daily.   cetirizine (ZYRTEC ALLERGY) 10 MG tablet Take 1 tablet (10 mg total) by mouth daily.   conjugated estrogens (PREMARIN) vaginal cream Use 1 gm in vagina at bedtime for 2 weeks then 2-3 x weekly   estradiol (ESTRACE) 1 MG tablet Take 1 tablet (1 mg total) by mouth daily.   fluvoxaMINE (LUVOX) 50 MG tablet Take 150 mg by mouth.   glipiZIDE (GLUCOTROL) 5 MG tablet Take by mouth daily before breakfast.   hydrochlorothiazide (MICROZIDE) 12.5 MG capsule Take 1 capsule (12.5 mg total) by mouth daily. (Patient taking differently: Take 25 mg by mouth daily.)   losartan (COZAAR) 25 MG tablet Take 25 mg by mouth daily.   mirtazapine (REMERON) 30 MG tablet Take 45 mg by mouth at bedtime.   montelukast (SINGULAIR) 10 MG tablet TAKE (1) TABLET BY MOUTH AT BEDTIME.   Multiple Vitamin (MULTIVITAMIN) tablet Take by mouth daily. Freedavite Multi Vit   omeprazole (PRILOSEC) 20 MG capsule Take 40 mg by mouth daily.   progesterone (PROMETRIUM) 200 MG capsule Take 1 capsule (200 mg total) by mouth daily.   RESTASIS MULTIDOSE 0.05 % ophthalmic emulsion    sertraline (ZOLOFT) 50 MG tablet Take 50 mg by mouth daily.   valACYclovir (VALTREX) 500 MG tablet Take 500 mg by mouth daily.   [DISCONTINUED] glipiZIDE (GLUCOTROL) 10 MG tablet Take 10 mg by mouth daily before breakfast.   Beclomethasone Diprop, Nasal, (QNASL NA) Place into the nose at bedtime. (Patient not taking: Reported on 03/15/2023)   Calcium Citrate  (CITRACAL PO) Take by mouth. (Patient not taking: Reported on 03/15/2023)   ferrous sulfate 325 (65 FE) MG tablet Take 325 mg by mouth daily with breakfast. (Patient not taking: Reported on 03/15/2023)   Multiple Vitamins-Minerals (HAIR SKIN NAILS PO) Take by mouth. (Patient not taking: Reported on 03/15/2023)   Multiple Vitamins-Minerals (OCUVITE PO) Take  by mouth daily. (Patient not taking: Reported on 03/15/2023)   Probiotic Product (ALIGN PO) Take by mouth. (Patient not taking: Reported on 03/15/2023)   No facility-administered encounter medications on file as of 03/15/2023.    ALLERGIES: Allergies  Allergen Reactions   Bacitracin-Polymyxin B Other (See Comments)    Cause wound to become infected Will actually cause infection instead of healing the area it has been applied to.   Codeine Nausea Only and Nausea And Vomiting   Neomycin-Bacitracin Zn-Polymyx Other (See Comments)    Will actually cause infection instead of healing the area it has been applied to.   Penicillins Rash    Starts at feet and works its way up the body.    Metformin Nausea And Vomiting    With dizziness   Neomycin-Polymyxin-Gramicidin Other (See Comments)    Cause wound to become infected   Roxicet [Oxycodone-Acetaminophen] Itching   Sulfonamide Derivatives     REACTION: UNKNOWN REACTION   Penicillin G Rash   Sulfamethoxazole Rash    VACCINATION STATUS:  There is no immunization history on file for this patient.  Diabetes She presents for her initial diabetic visit. She has type 2 diabetes mellitus. Onset time: diagnosed initially prior to gastric bypass in 2010, went into remission for a time, now it is back. Her disease course has been fluctuating. Hypoglycemia symptoms include nervousness/anxiousness, sweats and tremors. There are no hypoglycemic complications. (gastroparesis) Risk factors for coronary artery disease include diabetes mellitus, family history, hypertension and obesity. Current diabetic  treatment includes oral agent (monotherapy). She is compliant with treatment most of the time. Her weight is increasing steadily. She is following a generally unhealthy diet. When asked about meal planning, she reported none. She has not had a previous visit with a dietitian. She participates in exercise intermittently. Her breakfast blood glucose range is generally 180-200 mg/dl. (She presents today for her consultation with her fasting glucose logs showing above target readings.  Her POCT A1c today is 7.9%, increasing from last A1c of 6.8% in June.  She only checks glucose once daily.  She drinks coffee (with creamer), unsweet tea, soda and water.  She eats 2-3 meals per day and snacks between.  She does engage in routine physical activity with walking.  She is UTD on eye exam, has never seen podiatry in the past.  She notes she had gastric bypass in 2013 which corrected her diabetes.  She has slowly regained some weight and her diabetes relapsed.  She has been on Metformin in the past but could not tolerate the side effects.  She also notes she did well on GLP1 products but could not afford the copay for them.  She is concerned about her health as she has recently lost 10 teeth and has been diagnosed with low iron as well.  She has gained approximately 13 lbs in the last 3 months.) An ACE inhibitor/angiotensin II receptor blocker is being taken. She does not see a podiatrist.Eye exam is current.     Review of systems  Constitutional: + steadily increasing body weight, current Body mass index is 32.24 kg/m., no fatigue, no subjective hyperthermia, no subjective hypothermia Eyes: no blurry vision, no xerophthalmia ENT: no sore throat, no nodules palpated in throat, no dysphagia/odynophagia, no hoarseness Cardiovascular: no chest pain, no shortness of breath, no palpitations, no leg swelling Respiratory: no cough, no shortness of breath Gastrointestinal: no nausea/vomiting/diarrhea Musculoskeletal: no  muscle/joint aches Skin: no rashes, no hyperemia Neurological: no tremors, no numbness, no tingling, no  dizziness Psychiatric: no depression, no anxiety  Objective:     BP 126/89 (BP Location: Left Arm, Patient Position: Sitting, Cuff Size: Large)   Pulse (!) 108   Ht 5\' 3"  (1.6 m)   Wt 182 lb (82.6 kg)   LMP 10/18/2015   BMI 32.24 kg/m   Wt Readings from Last 3 Encounters:  03/15/23 182 lb (82.6 kg)  09/08/22 176 lb 8 oz (80.1 kg)  08/26/21 172 lb (78 kg)     BP Readings from Last 3 Encounters:  03/15/23 126/89  09/08/22 129/87  08/26/21 126/86     Physical Exam- Limited  Constitutional:  Body mass index is 32.24 kg/m. , not in acute distress, normal state of mind Eyes:  EOMI, no exophthalmos Neck: Supple Cardiovascular: RRR, no murmurs, rubs, or gallops, no edema Respiratory: Adequate breathing efforts, no crackles, rales, rhonchi, or wheezing Musculoskeletal: no gross deformities, strength intact in all four extremities, no gross restriction of joint movements Skin:  no rashes, no hyperemia Neurological: no tremor with outstretched hands   Diabetic Foot Exam - Simple   No data filed      CMP ( most recent) CMP     Component Value Date/Time   NA 140 12/08/2017 0256   NA 143 12/24/2014 1432   K 3.8 12/08/2017 0256   CL 106 12/08/2017 0256   CO2 25 12/08/2017 0256   GLUCOSE 107 (H) 12/08/2017 0256   BUN 20 11/29/2022 0000   CREATININE 0.8 11/29/2022 0000   CREATININE 0.80 12/08/2017 0256   CREATININE 0.69 08/15/2012 0835   CALCIUM 9.9 12/08/2017 0256   PROT 7.1 12/24/2014 1432   ALBUMIN 4.5 12/24/2014 1432   AST 32 12/24/2014 1432   ALT 44 (H) 12/24/2014 1432   ALKPHOS 98 12/24/2014 1432   BILITOT 0.3 12/24/2014 1432   EGFR >90 11/29/2022 0000   GFRNONAA >60 12/08/2017 0256     Diabetic Labs (most recent): Lab Results  Component Value Date   HGBA1C 7.9 (A) 03/15/2023   HGBA1C 6.8 11/29/2022   HGBA1C 6 05/25/2022     Lipid Panel ( most  recent) Lipid Panel     Component Value Date/Time   CHOL 138 08/15/2012 0835   TRIG 110 05/25/2022 0000   HDL 39 (L) 08/15/2012 0835   CHOLHDL 3.5 08/15/2012 0835   VLDL 17 08/15/2012 0835   LDLCALC 94 05/25/2022 0000      Lab Results  Component Value Date   TSH 0.906 12/24/2014   TSH 1.447 08/04/2011           Assessment & Plan:   1) Type 2 diabetes mellitus without complication, without long-term current use of insulin (HCC)  She presents today for her consultation with her fasting glucose logs showing above target readings.  Her POCT A1c today is 7.9%, increasing from last A1c of 6.8% in June.  She only checks glucose once daily.  She drinks coffee (with creamer), unsweet tea, soda and water.  She eats 2-3 meals per day and snacks between.  She does engage in routine physical activity with walking.  She is UTD on eye exam, has never seen podiatry in the past.  She notes she had gastric bypass in 2013 which corrected her diabetes.  She has slowly regained some weight and her diabetes relapsed.  She has been on Metformin in the past but could not tolerate the side effects.  She also notes she did well on GLP1 products but could not afford the copay for them.  She is concerned about her health as she has recently lost 10 teeth and has been diagnosed with low iron as well.  She has gained approximately 13 lbs in the last 3 months.  - Isaiah Pucciarelli has currently uncontrolled symptomatic type 2 DM, with most recent A1c of 7.9 %.   -Recent labs reviewed.  - I had a long discussion with her about the progressive nature of diabetes and the pathology behind its complications. -her diabetes is complicated by gastroparesis and she remains at a high risk for more acute and chronic complications which include CAD, CVA, CKD, retinopathy, and neuropathy. These are all discussed in detail with her.  The following Lifestyle Medicine recommendations according to American College of Lifestyle  Medicine [facility]) were discussed and offered to patient and she agrees to start the journey:  A. Whole Foods, Plant-based plate comprising of fruits and vegetables, plant-based proteins, whole-grain carbohydrates was discussed in detail with the patient.   A list for source of those nutrients were also provided to the patient.  Patient will use only water or unsweetened tea for hydration. B.  The need to stay away from risky substances including alcohol, smoking; obtaining 7 to 9 hours of restorative sleep, at least 150 minutes of moderate intensity exercise weekly, the importance of healthy social connections,  and stress reduction techniques were discussed. C.  A full color page of  Calorie density of various food groups per pound showing examples of each food groups was provided to the patient.  - I have counseled her on diet and weight management by adopting a carbohydrate restricted/protein rich diet. Patient is encouraged to switch to unprocessed or minimally processed complex starch and increased protein intake (animal or plant source), fruits, and vegetables. -  she is advised to stick to a routine mealtimes to eat 3 meals a day and avoid unnecessary snacks (to snack only to correct hypoglycemia).   - she acknowledges that there is a room for improvement in her food and drink choices. - Suggestion is made for her to avoid simple carbohydrates from her diet including Cakes, Sweet Desserts, Ice Cream, Soda (diet and regular), Sweet Tea, Candies, Chips, Cookies, Store Bought Juices, Alcohol in Excess of 1-2 drinks a day, Artificial Sweeteners, Coffee Creamer, and "Sugar-free" Products. This will help patient to have more stable blood glucose profile and potentially avoid unintended weight gain.  - I have approached her with the following individualized plan to manage her diabetes and patient agrees:    -she is encouraged to start monitoring glucose 2 times daily, before breakfast and before bed, to  log their readings on the clinic sheets provided, and bring them to review at follow up appointment in 3 months.  - Adjustment parameters are given to her for hypo and hyperglycemia in writing. - she is encouraged to call clinic for blood glucose levels less than 70 or above 300 mg /dl. - she is advised to lower her Glipizide to 5 mg XL daily at breakfast, therapeutically suitable for patient and safer dose for her.  - she is not a candidate for Metformin due to GI side effects.  - she will be considered for incretin therapy as appropriate next visit.  She has been prescribed both Trulicity and Ozempic in the past but could not afford the copay.  - Specific targets for  A1c; LDL, HDL, and Triglycerides were discussed with the patient.  2) Blood Pressure /Hypertension:  her blood pressure is controlled to target.   she  is advised to continue her current medications including Losartan 25 mg p.o. daily with breakfast and hydrochlorothiazide 25 mg po daily.  3) Lipids/Hyperlipidemia:    Review of her recent lipid panel from 05/25/22 showed controlled LDL at 94 .  she is advised to continue Lipitor 20 mg daily at bedtime.  Side effects and precautions discussed with her.  She has annual exam coming up in December with her PCP.  I requested that any labs done be forwarded to our office for records.  4)  Weight/Diet:  her Body mass index is 32.24 kg/m.  -  clearly complicating her diabetes care.   she is a candidate for weight loss. She did have gastric bypass surgery in the past but could stand to lose a bit more weight.  I discussed with her the fact that loss of 5 - 10% of her  current body weight will have the most impact on her diabetes management.  Exercise, and detailed carbohydrates information provided  -  detailed on discharge instructions.  5) Chronic Care/Health Maintenance: -she is on ACEI/ARB and Statin medications and is encouraged to initiate and continue to follow up with  Ophthalmology, Dentist, Podiatrist at least yearly or according to recommendations, and advised to stay away from smoking. I have recommended yearly flu vaccine and pneumonia vaccine at least every 5 years; moderate intensity exercise for up to 150 minutes weekly; and sleep for at least 7 hours a day.  - she is advised to maintain close follow up with Melody Luster, PA-C for primary care needs, as well as her other providers for optimal and coordinated care.  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  She notes she does take a calcium supplement but temporarily was told to stop prior to her colonoscopy.   - Time spent in this patient care: 60 min, of which > 50% was spent in counseling her about her diabetes and the rest reviewing her blood glucose logs, discussing her hypoglycemia and hyperglycemia episodes, reviewing her current and previous labs/studies (including abstraction from other facilities) and medications doses and developing a long term treatment plan based on the latest standards of care/guidelines; and documenting her care.    Please refer to Patient Instructions for Blood Glucose Monitoring and Insulin/Medications Dosing Guide" in media tab for additional information. Please also refer to "Patient Self Inventory" in the Media tab for reviewed elements of pertinent patient history.  Armandina Viernes participated in the discussions, expressed understanding, and voiced agreement with the above plans.  All questions were answered to her satisfaction. she is encouraged to contact clinic should she have any questions or concerns prior to her return visit.     Follow up plan: - Return in about 3 months (around 06/14/2023) for Diabetes F/U with A1c in office, No previsit labs, Bring meter and logs.    Ronny Bacon, [facility] [address] [place], [place] [address] Phone:  [phone] Fax: [phone]  03/15/2023, 12:12 PM

## 2023-04-13 ENCOUNTER — Encounter: Payer: Self-pay | Admitting: Nurse Practitioner

## 2023-06-02 LAB — TSH: TSH: 2 (ref 0.41–5.90)

## 2023-06-02 LAB — BASIC METABOLIC PANEL
BUN: 24 — AB (ref 4–21)
Creatinine: 0.8 (ref 0.5–1.1)
Glucose: 130

## 2023-06-02 LAB — LIPID PANEL
LDL Cholesterol: 93
Triglycerides: 110 (ref 40–160)

## 2023-06-02 LAB — COMPREHENSIVE METABOLIC PANEL
Calcium: 10.2 (ref 8.7–10.7)
eGFR: >90

## 2023-06-27 ENCOUNTER — Encounter: Payer: Self-pay | Admitting: Nurse Practitioner

## 2023-06-27 ENCOUNTER — Ambulatory Visit (INDEPENDENT_AMBULATORY_CARE_PROVIDER_SITE_OTHER): Payer: BC Managed Care – PPO | Admitting: Nurse Practitioner

## 2023-06-27 VITALS — BP 122/80 | HR 108 | Ht 63.0 in | Wt 172.0 lb

## 2023-06-27 DIAGNOSIS — E782 Mixed hyperlipidemia: Secondary | ICD-10-CM

## 2023-06-27 DIAGNOSIS — Z7984 Long term (current) use of oral hypoglycemic drugs: Secondary | ICD-10-CM | POA: Diagnosis not present

## 2023-06-27 DIAGNOSIS — I10 Essential (primary) hypertension: Secondary | ICD-10-CM | POA: Diagnosis not present

## 2023-06-27 DIAGNOSIS — E119 Type 2 diabetes mellitus without complications: Secondary | ICD-10-CM

## 2023-06-27 LAB — POCT GLYCOSYLATED HEMOGLOBIN (HGB A1C): Hemoglobin A1C: 7 % — AB (ref 4.0–5.6)

## 2023-06-27 LAB — POCT UA - MICROALBUMIN

## 2023-06-27 NOTE — Progress Notes (Signed)
 Endocrinology Follow Up Note       06/27/2023, 8:46 AM   Subjective:    Patient ID: Melody Johnson, female    DOB: 1965/07/05.  Melody Johnson is being seen in follow up after being seen in consultation for management of currently uncontrolled symptomatic diabetes requested by  James, Natalie W, PA-C.   Past Medical History:  Diagnosis Date   Anxiety    Arthritis    Depression    Diabetes mellitus without complication (HCC)    off meds after gastric bypass   GERD (gastroesophageal reflux disease)    High cholesterol    Hot flashes 12/24/2014   Hypertension    off meds after gastric bypass   Night sweats 12/24/2014   Obstructive sleep apnea    not using CPAP due to gastric bypass- not used for iver 2 yrs   PMB (postmenopausal bleeding) 12/25/2015   PONV (postoperative nausea and vomiting)    Postmenopausal HRT (hormone replacement therapy) 12/25/2015    Past Surgical History:  Procedure Laterality Date   CATARACT EXTRACTION W/PHACO Left 07/24/2014   Procedure: CATARACT EXTRACTION PHACO AND INTRAOCULAR LENS PLACEMENT LEFT;  Surgeon: Cherene Mania, MD;  Location: [facility];  Service: Ophthalmology;  Laterality: Left;  CDE:1.82   CATARACT EXTRACTION W/PHACO Right 08/04/2014   Procedure: CATARACT EXTRACTION PHACO AND INTRAOCULAR LENS PLACEMENT RIGHT EYE CDE=1.23;  Surgeon: Cherene Mania, MD;  Location: [facility];  Service: Ophthalmology;  Laterality: Right;   CERVICAL SPINE SURGERY  2000   CHOLECYSTECTOMY  2002   GASTRIC ROUX-EN-Y  05/01/2012   Procedure: LAPAROSCOPIC ROUX-EN-Y GASTRIC BYPASS WITH UPPER ENDOSCOPY;  Surgeon: Redell Faith, DO;  Location: [facility];  Service: General;  Laterality: N/A;   LASIK  1996   LITHOTRIPSY  05/2011    Social History   Socioeconomic History   Marital status: Married    Spouse name: Not on file   Number of children: Not on file   Years of education: Not on file   Highest education  level: Not on file  Occupational History   Not on file  Tobacco Use   Smoking status: Never   Smokeless tobacco: Never  Vaping Use   Vaping status: Never Used  Substance and Sexual Activity   Alcohol  use: Yes    Comment: occ   Drug use: No   Sexual activity: Yes    Birth control/protection: Post-menopausal  Other Topics Concern   Not on file  Social History Narrative   Not on file   Social Drivers of Health   Financial Resource Strain: Medium Risk (09/08/2022)   Overall Financial Resource Strain (CARDIA)    Difficulty of Paying Living Expenses: Somewhat hard  Food Insecurity: Low Risk  (04/13/2023)   Received from [facility]   Hunger Vital Sign    Worried About Running Out of Food in the Last Year: Never true    Ran Out of Food in the Last Year: Never true  Transportation Needs: No Transportation Needs (04/13/2023)   Received from [facility]    In the past 12 months, has lack of reliable transportation kept you from medical appointments, meetings, work or from getting things needed for daily  living? : No  Physical Activity: Insufficiently Active (09/08/2022)   Exercise Vital Sign    Days of Exercise per Week: 3 days    Minutes of Exercise per Session: 30 min  Stress: Stress Concern Present (09/08/2022)   [employer] of Occupational Health - Occupational Stress Questionnaire    Feeling of Stress : To some extent  Social Connections: Moderately Integrated (09/08/2022)   Social Connection and Isolation Panel [NHANES]    Frequency of Communication with Friends and Family: More than three times a week    Frequency of Social Gatherings with Friends and Family: Once a week    Attends Religious Services: 1 to 4 times per year    Active Member of [employer] or Organizations: No    Attends Banker Meetings: Never    Marital Status: Married    Family History  Problem Relation Age of Onset   Cancer Sister        cervical   Cancer Maternal  Grandmother        colon   Crohn's disease Mother    Leukemia Mother    Hyperlipidemia Mother    Thyroid disease Mother    Stroke Mother    Diabetes Father    Hypertension Father    Hyperlipidemia Father    Diabetes Sister     Outpatient Encounter Medications as of 06/27/2023  Medication Sig   ALPRAZolam (XANAX) 0.5 MG tablet Take 0.5 mg by mouth 3 (three) times daily as needed for anxiety.   atorvastatin (LIPITOR) 10 MG tablet Take 10 mg by mouth daily.   busPIRone (BUSPAR) 15 MG tablet Take 30 mg by mouth 2 (two) times daily.   cetirizine  (ZYRTEC  ALLERGY) 10 MG tablet Take 1 tablet (10 mg total) by mouth daily.   conjugated estrogens  (PREMARIN ) vaginal cream Use 1 gm in vagina at bedtime for 2 weeks then 2-3 x weekly   estradiol  (ESTRACE ) 1 MG tablet Take 1 tablet (1 mg total) by mouth daily.   glipiZIDE  (GLUCOTROL ) 5 MG tablet Take by mouth daily before breakfast.   hydrochlorothiazide  (MICROZIDE ) 12.5 MG capsule Take 1 capsule (12.5 mg total) by mouth daily. (Patient taking differently: Take 25 mg by mouth daily.)   losartan (COZAAR) 25 MG tablet Take 25 mg by mouth daily.   mirtazapine (REMERON) 30 MG tablet Take 45 mg by mouth at bedtime.   montelukast  (SINGULAIR ) 10 MG tablet TAKE (1) TABLET BY MOUTH AT BEDTIME.   Multiple Vitamin (MULTIVITAMIN) tablet Take by mouth daily. Freedavite Multi Vit   Multiple Vitamins-Minerals (HAIR SKIN NAILS PO) Take by mouth.   Multiple Vitamins-Minerals (OCUVITE PO) Take by mouth daily.   omeprazole (PRILOSEC) 20 MG capsule Take 40 mg by mouth daily.   Probiotic Product (ALIGN PO) Take by mouth.   progesterone  (PROMETRIUM ) 200 MG capsule Take 1 capsule (200 mg total) by mouth daily.   RESTASIS  MULTIDOSE 0.05 % ophthalmic emulsion    valACYclovir (VALTREX) 500 MG tablet Take 500 mg by mouth daily.   Calcium Citrate (CITRACAL PO) Take by mouth. (Patient not taking: Reported on 06/27/2023)   fluvoxaMINE (LUVOX) 50 MG tablet Take 150 mg by mouth.  (Patient not taking: Reported on 06/27/2023)   [DISCONTINUED] Beclomethasone Diprop, Nasal, (QNASL  NA) Place into the nose at bedtime. (Patient not taking: Reported on 06/27/2023)   [DISCONTINUED] ferrous sulfate 325 (65 FE) MG tablet Take 325 mg by mouth daily with breakfast. (Patient not taking: Reported on 06/27/2023)   [DISCONTINUED] sertraline (ZOLOFT) 50 MG tablet  Take 50 mg by mouth daily. (Patient not taking: Reported on 06/27/2023)   No facility-administered encounter medications on file as of 06/27/2023.    ALLERGIES: Allergies  Allergen Reactions   Bacitracin-Polymyxin B Other (See Comments)    Cause wound to become infected Will actually cause infection instead of healing the area it has been applied to.   Codeine Nausea Only and Nausea And Vomiting   Neomycin -Bacitracin Zn-Polymyx Other (See Comments)    Will actually cause infection instead of healing the area it has been applied to.   Penicillins Rash    Starts at feet and works its way up the body.    Metformin  Nausea And Vomiting    With dizziness   Neomycin -Polymyxin-Gramicidin Other (See Comments)    Cause wound to become infected   Roxicet [Oxycodone -Acetaminophen ] Itching   Sulfonamide Derivatives     REACTION: UNKNOWN REACTION   Penicillin G Rash   Sulfamethoxazole Rash    VACCINATION STATUS:  There is no immunization history on file for this patient.  Diabetes She presents for her follow-up diabetic visit. She has type 2 diabetes mellitus. Onset time: diagnosed initially prior to gastric bypass in 2010, went into remission for a time, now it is back. Her disease course has been improving. Hypoglycemia symptoms include nervousness/anxiousness, sweats and tremors. There are no hypoglycemic complications. (gastroparesis) Risk factors for coronary artery disease include diabetes mellitus, family history, hypertension and obesity. Current diabetic treatment includes oral agent (monotherapy). She is compliant with  treatment most of the time. Her weight is decreasing steadily. She is following a generally healthy diet. When asked about meal planning, she reported none. She has not had a previous visit with a dietitian. She participates in exercise intermittently. Her home blood glucose trend is decreasing steadily. Her breakfast blood glucose range is generally 180-200 mg/dl. (She presents today with her logs showing improving glycemic profile overall.  Her POCT A1c today is 7%, improving from last visit of 7.9%.  She has worked on diet and exercise.  She does note random drops in glucose, down to 70s before bed at one point.  Her night time readings happen to be better than her next morning readings, suspicious for liver dumping vs dawn phenomenon.) An ACE inhibitor/angiotensin II receptor blocker is being taken. She does not see a podiatrist.Eye exam is current.     Review of systems  Constitutional: + steadily decreasing body weight, current Body mass index is 30.47 kg/m., no fatigue, no subjective hyperthermia, no subjective hypothermia Eyes: no blurry vision, no xerophthalmia ENT: no sore throat, no nodules palpated in throat, no dysphagia/odynophagia, no hoarseness Cardiovascular: no chest pain, no shortness of breath, no palpitations, no leg swelling Respiratory: no cough, no shortness of breath Gastrointestinal: no nausea/vomiting/diarrhea Musculoskeletal: no muscle/joint aches Skin: no rashes, no hyperemia Neurological: no tremors, no numbness, no tingling, no dizziness Psychiatric: no depression, no anxiety  Objective:     BP 122/80 (BP Location: Left Arm, Cuff Size: Large)   Pulse (!) 108   Ht 5' 3 (1.6 m)   Wt 172 lb (78 kg)   LMP 10/18/2015   BMI 30.47 kg/m   Wt Readings from Last 3 Encounters:  06/27/23 172 lb (78 kg)  03/15/23 182 lb (82.6 kg)  09/08/22 176 lb 8 oz (80.1 kg)     BP Readings from Last 3 Encounters:  06/27/23 122/80  03/15/23 126/89  09/08/22 129/87      Physical Exam- Limited  Constitutional:  Body mass index is  30.47 kg/m. , not in acute distress, normal state of mind Eyes:  EOMI, no exophthalmos Musculoskeletal: no gross deformities, strength intact in all four extremities, no gross restriction of joint movements Skin:  no rashes, no hyperemia Neurological: no tremor with outstretched hands   Diabetic Foot Exam - Simple   No data filed      CMP ( most recent) CMP     Component Value Date/Time   NA 140 12/08/2017 0256   NA 143 12/24/2014 1432   K 3.8 12/08/2017 0256   CL 106 12/08/2017 0256   CO2 25 12/08/2017 0256   GLUCOSE 107 (H) 12/08/2017 0256   BUN 24 (A) 06/02/2023 0000   CREATININE 0.8 06/02/2023 0000   CREATININE 0.80 12/08/2017 0256   CREATININE 0.69 08/15/2012 0835   CALCIUM 10.2 06/02/2023 0000   PROT 7.1 12/24/2014 1432   ALBUMIN 4.5 12/24/2014 1432   AST 32 12/24/2014 1432   ALT 44 (H) 12/24/2014 1432   ALKPHOS 98 12/24/2014 1432   BILITOT 0.3 12/24/2014 1432   EGFR >90 06/02/2023 0000   GFRNONAA >60 12/08/2017 0256     Diabetic Labs (most recent): Lab Results  Component Value Date   HGBA1C 7.0 (A) 06/27/2023   HGBA1C 7.9 (A) 03/15/2023   HGBA1C 6.8 11/29/2022   MICROALBUR 80mg /L 06/27/2023     Lipid Panel ( most recent) Lipid Panel     Component Value Date/Time   CHOL 138 08/15/2012 0835   TRIG 110 06/02/2023 0000   HDL 39 (L) 08/15/2012 0835   CHOLHDL 3.5 08/15/2012 0835   VLDL 17 08/15/2012 0835   LDLCALC 93 06/02/2023 0000      Lab Results  Component Value Date   TSH 2.00 06/02/2023   TSH 0.906 12/24/2014   TSH 1.447 08/04/2011           Assessment & Plan:   1) Type 2 diabetes mellitus without complication, without long-term current use of insulin  (HCC)  She presents today with her logs showing improving glycemic profile overall.  Her POCT A1c today is 7%, improving from last visit of 7.9%.  She has worked on diet and exercise.  She does note random drops in  glucose, down to 70s before bed at one point.  Her night time readings happen to be better than her next morning readings, suspicious for liver dumping vs dawn phenomenon.  - Latifa Puello has currently uncontrolled symptomatic type 2 DM.   -Recent labs reviewed.  - I had a long discussion with her about the progressive nature of diabetes and the pathology behind its complications. -her diabetes is complicated by gastroparesis and she remains at a high risk for more acute and chronic complications which include CAD, CVA, CKD, retinopathy, and neuropathy. These are all discussed in detail with her.  The following Lifestyle Medicine recommendations according to American College of Lifestyle Medicine [facility]) were discussed and offered to patient and she agrees to start the journey:  A. Whole Foods, Plant-based plate comprising of fruits and vegetables, plant-based proteins, whole-grain carbohydrates was discussed in detail with the patient.   A list for source of those nutrients were also provided to the patient.  Patient will use only water or unsweetened tea for hydration. B.  The need to stay away from risky substances including alcohol , smoking; obtaining 7 to 9 hours of restorative sleep, at least 150 minutes of moderate intensity exercise weekly, the importance of healthy social connections,  and stress reduction techniques were discussed. C.  A  full color page of  Calorie density of various food groups per pound showing examples of each food groups was provided to the patient.  - Nutritional counseling repeated at each appointment due to patients tendency to fall back in to old habits.  - The patient admits there is a room for improvement in their diet and drink choices. -  Suggestion is made for the patient to avoid simple carbohydrates from their diet including Cakes, Sweet Desserts / Pastries, Ice Cream, Soda (diet and regular), Sweet Tea, Candies, Chips, Cookies, Sweet Pastries, Store  Bought Juices, Alcohol  in Excess of 1-2 drinks a day, Artificial Sweeteners, Coffee Creamer, and Sugar-free Products. This will help patient to have stable blood glucose profile and potentially avoid unintended weight gain.   - I encouraged the patient to switch to unprocessed or minimally processed complex starch and increased protein intake (animal or plant source), fruits, and vegetables.   - Patient is advised to stick to a routine mealtimes to eat 3 meals a day and avoid unnecessary snacks (to snack only to correct hypoglycemia).  - I have approached her with the following individualized plan to manage her diabetes and patient agrees:   - she is advised to lower her Glipizide  back to 5 mg XL daily at breakfast, therapeutically suitable for patient and safer dose for her given the suspicion of dumping.  -she is encouraged to continue monitoring glucose 2 times daily, before breakfast and before bed, and to call the clinic if she has readings less than 70 or above 300 for 3 tests in a row.  - Adjustment parameters are given to her for hypo and hyperglycemia in writing.  - she is not a candidate for Metformin  due to GI side effects.  - she will be considered for incretin therapy as appropriate next visit.  She has been prescribed both Trulicity (did better on this one vs Ozempic) and Ozempic in the past but could not afford the copay.  She does have follow up coming up with her bariatric surgeon and plans to discuss low ferritin with them as she has not been able to tolerate any supplementation for this.  - Specific targets for  A1c; LDL, HDL, and Triglycerides were discussed with the patient.  2) Blood Pressure /Hypertension:  her blood pressure is controlled to target.   she is advised to continue her current medications including Losartan 25 mg p.o. daily with breakfast and hydrochlorothiazide  25 mg po daily.  3) Lipids/Hyperlipidemia:    Review of her recent lipid panel from 06/02/23  showed controlled LDL at 93 .  she is advised to continue Lipitor 20 mg daily at bedtime.  Side effects and precautions discussed with her.    4)  Weight/Diet:  her Body mass index is 30.47 kg/m.  -  clearly complicating her diabetes care.   she is a candidate for weight loss. She did have gastric bypass surgery in the past but could stand to lose a bit more weight.  I discussed with her the fact that loss of 5 - 10% of her  current body weight will have the most impact on her diabetes management.  Exercise, and detailed carbohydrates information provided  -  detailed on discharge instructions.  5) Chronic Care/Health Maintenance: -she is on ACEI/ARB and Statin medications and is encouraged to initiate and continue to follow up with Ophthalmology, Dentist, Podiatrist at least yearly or according to recommendations, and advised to stay away from smoking. I have recommended yearly flu vaccine  and pneumonia vaccine at least every 5 years; moderate intensity exercise for up to 150 minutes weekly; and sleep for at least 7 hours a day.  - she is advised to maintain close follow up with James, Natalie W, PA-C for primary care needs, as well as her other providers for optimal and coordinated care.  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  She notes she does take a calcium supplement but temporarily was told to stop prior to her colonoscopy.     I spent  46  minutes in the care of the patient today including review of labs from CMP, Lipids, Thyroid Function, Hematology (current and previous including abstractions from other facilities); face-to-face time discussing  her blood glucose readings/logs, discussing hypoglycemia and hyperglycemia episodes and symptoms, medications doses, her options of short and long term treatment based on the latest standards of care / guidelines;  discussion about incorporating lifestyle medicine;  and  documenting the encounter. Risk reduction counseling performed per USPSTF guidelines to reduce obesity and cardiovascular risk factors.     Please refer to Patient Instructions for Blood Glucose Monitoring and Insulin /Medications Dosing Guide  in media tab for additional information. Please  also refer to  Patient Self Inventory in the Media  tab for reviewed elements of pertinent patient history.  Stephene Mcleish participated in the discussions, expressed understanding, and voiced agreement with the above plans.  All questions were answered to her satisfaction. she is encouraged to contact clinic should she have any questions or concerns prior to her return visit.     Follow up plan: - Return in about 3 months (around 09/25/2023) for Diabetes F/U with A1c in office, No previsit labs, Bring meter and logs.   Benton Rio, [facility] [address] [place], [place] [address] Phone: [phone] Fax: [phone]  06/27/2023, 8:46 AM

## 2023-07-13 ENCOUNTER — Encounter: Payer: Self-pay | Admitting: Nurse Practitioner

## 2023-07-28 ENCOUNTER — Other Ambulatory Visit (HOSPITAL_COMMUNITY): Payer: Self-pay | Admitting: Family Medicine

## 2023-07-28 DIAGNOSIS — Z1231 Encounter for screening mammogram for malignant neoplasm of breast: Secondary | ICD-10-CM

## 2023-08-10 ENCOUNTER — Encounter: Payer: Self-pay | Admitting: Nurse Practitioner

## 2023-08-11 MED ORDER — ONETOUCH VERIO VI STRP: ORAL_STRIP | 12 refills | Status: DC

## 2023-08-11 MED ORDER — TIRZEPATIDE 2.5 MG/0.5ML ~~LOC~~ SOAJ
2.5000 mg | SUBCUTANEOUS | 0 refills | Status: DC
Start: 2023-08-11 — End: 2023-09-25

## 2023-08-11 NOTE — Telephone Encounter (Signed)
FYI: Please be on the lookout for PA for [facility]- the system says it is required.

## 2023-08-14 ENCOUNTER — Telehealth: Payer: Self-pay

## 2023-08-14 ENCOUNTER — Other Ambulatory Visit (HOSPITAL_COMMUNITY): Payer: Self-pay

## 2023-08-14 NOTE — Telephone Encounter (Signed)
 Pharmacy Patient Advocate Encounter   Received notification from Patient Advice Request messages that prior authorization for Melody Johnson is required/requested.   Insurance verification completed.   The patient is insured through [facility] .   Per test claim: PA required; PA submitted to above mentioned insurance via CoverMyMeds Key/confirmation #/EOC [id number] Status is pending

## 2023-08-18 NOTE — Telephone Encounter (Signed)
 Pharmacy Patient Advocate Encounter  Received notification from [facility] that Prior Authorization for Melody Johnson has been APPROVED through 08/13/24

## 2023-08-21 NOTE — Telephone Encounter (Signed)
 Patient was called and message was left on her voicemail, letting her know that her [name] had been approved.

## 2023-08-25 ENCOUNTER — Encounter (HOSPITAL_COMMUNITY): Payer: Self-pay

## 2023-08-25 ENCOUNTER — Ambulatory Visit (HOSPITAL_COMMUNITY)
Admission: RE | Admit: 2023-08-25 | Discharge: 2023-08-25 | Disposition: A | Discharge status: Home / Self Care | Payer: BC Managed Care – PPO | Source: Ambulatory Visit | Attending: Family Medicine | Admitting: Family Medicine

## 2023-08-25 DIAGNOSIS — Z1231 Encounter for screening mammogram for malignant neoplasm of breast: Secondary | ICD-10-CM | POA: Insufficient documentation

## 2023-08-31 ENCOUNTER — Encounter: Payer: Self-pay | Admitting: Nurse Practitioner

## 2023-09-20 ENCOUNTER — Ambulatory Visit: Payer: BC Managed Care – PPO | Admitting: Adult Health

## 2023-09-20 ENCOUNTER — Encounter: Payer: Self-pay | Admitting: Adult Health

## 2023-09-20 VITALS — BP 127/89 | HR 100 | Ht 63.0 in | Wt 169.8 lb

## 2023-09-20 DIAGNOSIS — I10 Essential (primary) hypertension: Secondary | ICD-10-CM | POA: Diagnosis not present

## 2023-09-20 DIAGNOSIS — Z01419 Encounter for gynecological examination (general) (routine) without abnormal findings: Secondary | ICD-10-CM | POA: Diagnosis not present

## 2023-09-20 DIAGNOSIS — Z79890 Hormone replacement therapy (postmenopausal): Secondary | ICD-10-CM | POA: Diagnosis not present

## 2023-09-20 DIAGNOSIS — Z1211 Encounter for screening for malignant neoplasm of colon: Secondary | ICD-10-CM

## 2023-09-20 DIAGNOSIS — R232 Flushing: Secondary | ICD-10-CM

## 2023-09-20 DIAGNOSIS — Z1331 Encounter for screening for depression: Secondary | ICD-10-CM | POA: Diagnosis not present

## 2023-09-20 LAB — HEMOCCULT GUIAC POC 1CARD (OFFICE): Fecal Occult Blood, POC: NEGATIVE

## 2023-09-20 MED ORDER — ESTRADIOL 2 MG PO TABS: 2.0000 mg | ORAL_TABLET | Freq: Every day | ORAL | 3 refills | Status: DC

## 2023-09-20 MED ORDER — PROGESTERONE 200 MG PO CAPS: 200.0000 mg | ORAL_CAPSULE | Freq: Every day | ORAL | 4 refills | Status: AC

## 2023-09-20 MED ORDER — PREMARIN 0.625 MG/GM VA CREA: TOPICAL_CREAM | VAGINAL | 3 refills | Status: AC

## 2023-09-20 NOTE — Progress Notes (Signed)
 Patient ID: Melody Johnson, female   DOB: Nov 20, 1965, 58 y.o.   MRN: [id number] History of Present Illness: Melody Johnson is a 58 year old white female, married, PM in for a well woman gyn exam. She is having night sweats and some hot flashes. She had tried mounjaro and had side effects, so stopped on glipizide now     Component Value Date/Time   DIAGPAP  08/26/2021 0832    - Negative for intraepithelial lesion or malignancy (NILM)   DIAGPAP  08/23/2018 0000    NEGATIVE FOR INTRAEPITHELIAL LESIONS OR MALIGNANCY.   HPVHIGH Negative 08/26/2021 0832   ADEQPAP  08/26/2021 0832    Satisfactory for evaluation; transformation zone component ABSENT.   ADEQPAP  08/23/2018 0000    Satisfactory for evaluation  endocervical/transformation zone component PRESENT.    PCP is Chilton Greathouse PA   Current Medications, Allergies, Past Medical History, Past Surgical History, Family History and Social History were reviewed in [employer] record.     Review of Systems: Patient denies any headaches, hearing loss, fatigue, blurred vision, shortness of breath, chest pain, abdominal pain, problems with bowel movements, urination, or intercourse. No joint pain or mood swings.  Denies any vaginal bleeding See HPI for positives   Physical Exam:BP 127/89 (BP Location: Right Arm, Patient Position: Sitting, Cuff Size: Normal)   Pulse 100   Ht 5\' 3"  (1.6 m)   Wt 169 lb 12.8 oz (77 kg)   LMP 10/18/2015   BMI 30.08 kg/m   General:  Well developed, well nourished, no acute distress Skin:  Warm and dry Neck:  Midline trachea, normal thyroid, good ROM, no lymphadenopathy Lungs; Clear to auscultation bilaterally Breast:  No dominant palpable mass, retraction, or nipple discharge Cardiovascular: Regular rate and rhythm Abdomen:  Soft, non tender, no hepatosplenomegaly Pelvic:  External genitalia is normal in appearance, no lesions.  The vagina is normal in appearance. Urethra has no lesions or  masses. The cervix is smooth.  Uterus is felt to be normal size, shape, and contour.  No adnexal masses or tenderness noted.Bladder is non tender, no masses felt. Rectal: Good sphincter tone, no polyps, or hemorrhoids felt.  Hemoccult negative. Extremities/musculoskeletal:  No swelling or varicosities noted, no clubbing or cyanosis Psych:  No mood changes, alert and cooperative,seems happy AA is 0 Fall risk is low    09/20/2023   10:33 AM 09/08/2022    8:53 AM 08/26/2021    8:45 AM  Depression screen PHQ 2/9  Decreased Interest 0 1 1  Down, Depressed, Hopeless 0 1 1  PHQ - 2 Score 0 2 2  Altered sleeping 1 0 1  Tired, decreased energy 1 2 1   Change in appetite 1 1 0  Feeling bad or failure about yourself  0 0 1  Trouble concentrating 0 0 1  Moving slowly or fidgety/restless 0 0 0  Suicidal thoughts 0 0 0  PHQ-9 Score 3 5 6        09/20/2023   10:33 AM 09/08/2022    8:53 AM 08/26/2021    8:45 AM 08/24/2020    8:39 AM  GAD 7 : Generalized Anxiety Score  Nervous, Anxious, on Edge 1 1 2 1   Control/stop worrying 0 0 2 0  Worry too much - different things 0 1 2 1   Trouble relaxing 1 2 2 1   Restless 0 0 2 1  Easily annoyed or irritable 0 0 2 1  Afraid - awful might happen 0 1 0 1  Total GAD 7 Score 2 5 12 6       Upstream - 09/20/23 1054       Pregnancy Intention Screening   Does the patient want to become pregnant in the next year? N/A    Does the patient's partner want to become pregnant in the next year? N/A    Would the patient like to discuss contraceptive options today? N/A      Contraception Wrap Up   Current Method --   PM   End Method --   PM   Contraception Counseling Provided No            Examination chaperoned by Faith Rogue LPN  Impression and plan: 1. Encounter for well woman exam with routine gynecological exam (Primary) Pap and physical in 1 year Labs with PCP Mammogram was negative 08/25/23 Colonoscopy per GI   2. Encounter for screening fecal  occult blood testing Hemoccult was negative   3. Postmenopausal HRT (hormone replacement therapy) Increased estrace to 2 mg  Refill PVC and Prometrium  Meds ordered this encounter  Medications   estradiol (ESTRACE) 2 MG tablet    Sig: Take 1 tablet (2 mg total) by mouth daily.    Dispense:  90 tablet    Refill:  3    Supervising Provider:   Duane Lope H [[id number]]   conjugated estrogens (PREMARIN) vaginal cream    Sig: Use 1 gm in vagina at bedtime for 2 weeks then 2-3 x weekly    Dispense:  30 g    Refill:  3    Supervising Provider:   Duane Lope H [[id number]]   progesterone (PROMETRIUM) 200 MG capsule    Sig: Take 1 capsule (200 mg total) by mouth daily.    Dispense:  90 capsule    Refill:  4    Supervising Provider:   Duane Lope H [[id number]]     4. Hot flashes Will increase estrace to 2 mg 1 daily. Let me know if helps     5. Essential hypertension On meds

## 2023-09-25 ENCOUNTER — Ambulatory Visit (INDEPENDENT_AMBULATORY_CARE_PROVIDER_SITE_OTHER): Payer: BC Managed Care – PPO | Admitting: Nurse Practitioner

## 2023-09-25 ENCOUNTER — Encounter: Payer: Self-pay | Admitting: Nurse Practitioner

## 2023-09-25 VITALS — BP 100/78 | HR 98 | Ht 63.0 in | Wt 170.0 lb

## 2023-09-25 DIAGNOSIS — E119 Type 2 diabetes mellitus without complications: Secondary | ICD-10-CM

## 2023-09-25 DIAGNOSIS — E782 Mixed hyperlipidemia: Secondary | ICD-10-CM

## 2023-09-25 DIAGNOSIS — Z7984 Long term (current) use of oral hypoglycemic drugs: Secondary | ICD-10-CM

## 2023-09-25 DIAGNOSIS — I10 Essential (primary) hypertension: Secondary | ICD-10-CM | POA: Diagnosis not present

## 2023-09-25 LAB — POCT GLYCOSYLATED HEMOGLOBIN (HGB A1C): Hemoglobin A1C: 7.5 % — AB (ref 4.0–5.6)

## 2023-09-25 MED ORDER — METFORMIN HCL ER 500 MG PO TB24: 500.0000 mg | ORAL_TABLET | Freq: Every day | ORAL | 1 refills | Status: DC

## 2023-09-25 NOTE — Progress Notes (Signed)
 Endocrinology Follow Up Note       09/25/2023, 8:45 AM   Subjective:    Patient ID: Melody Johnson, female    DOB: 09-24-1965.  Melody Johnson is being seen in follow up after being seen in consultation for management of currently uncontrolled symptomatic diabetes requested by  Odis Luster, PA-C.   Past Medical History:  Diagnosis Date   Anxiety    Arthritis    Depression    Diabetes mellitus without complication (HCC)    off meds after gastric bypass   GERD (gastroesophageal reflux disease)    High cholesterol    Hot flashes 12/24/2014   Hypertension    off meds after gastric bypass   Night sweats 12/24/2014   Obstructive sleep apnea    not using CPAP due to gastric bypass- not used for iver 2 yrs   PMB (postmenopausal bleeding) 12/25/2015   PONV (postoperative nausea and vomiting)    Postmenopausal HRT (hormone replacement therapy) 12/25/2015    Past Surgical History:  Procedure Laterality Date   BREAST BIOPSY Right 06/02/2015   FIBROCYSTIC CHANGE WITH APOCRINE METAPLASIA   CATARACT EXTRACTION W/PHACO Left 07/24/2014   Procedure: CATARACT EXTRACTION PHACO AND INTRAOCULAR LENS PLACEMENT LEFT;  Surgeon: Gemma Payor, MD;  Location: [facility];  Service: Ophthalmology;  Laterality: Left;  CDE:1.82   CATARACT EXTRACTION W/PHACO Right 08/04/2014   Procedure: CATARACT EXTRACTION PHACO AND INTRAOCULAR LENS PLACEMENT RIGHT EYE CDE=1.23;  Surgeon: Gemma Payor, MD;  Location: [facility];  Service: Ophthalmology;  Laterality: Right;   CERVICAL SPINE SURGERY  06/27/1998   CHOLECYSTECTOMY  06/27/2000   GASTRIC ROUX-EN-Y  05/01/2012   Procedure: LAPAROSCOPIC ROUX-EN-Y GASTRIC BYPASS WITH UPPER ENDOSCOPY;  Surgeon: Lodema Pilot, DO;  Location: [facility];  Service: General;  Laterality: N/A;   LASIK  06/27/1994   LITHOTRIPSY  05/28/2011    Social History   Socioeconomic History   Marital status: Married    Spouse name:  Not on file   Number of children: Not on file   Years of education: Not on file   Highest education level: Not on file  Occupational History   Not on file  Tobacco Use   Smoking status: Never   Smokeless tobacco: Never  Vaping Use   Vaping status: Never Used  Substance and Sexual Activity   Alcohol use: Yes    Comment: occ   Drug use: No   Sexual activity: Yes    Birth control/protection: Post-menopausal  Other Topics Concern   Not on file  Social History Narrative   Not on file   Social Drivers of Health   Financial Resource Strain: Low Risk  (09/20/2023)   Overall Financial Resource Strain (CARDIA)    Difficulty of Paying Living Expenses: Not very hard  Food Insecurity: No Food Insecurity (09/20/2023)   Hunger Vital Sign    Worried About Running Out of Food in the Last Year: Never true    Ran Out of Food in the Last Year: Never true  Transportation Needs: No Transportation Needs (09/20/2023)   PRAPARE - Administrator, Civil Service (Medical): No    Lack of Transportation (Non-Medical): No  Physical Activity: Insufficiently Active (09/20/2023)  Exercise Vital Sign    Days of Exercise per Week: 2 days    Minutes of Exercise per Session: 40 min  Stress: No Stress Concern Present (09/20/2023)   [employer] of Occupational Health - Occupational Stress Questionnaire    Feeling of Stress : Only a little  Social Connections: Moderately Integrated (09/20/2023)   Social Connection and Isolation Panel [NHANES]    Frequency of Communication with Friends and Family: More than three times a week    Frequency of Social Gatherings with Friends and Family: Once a week    Attends Religious Services: 1 to 4 times per year    Active Member of [employer] or Organizations: No    Attends Banker Meetings: Never    Marital Status: Married    Family History  Problem Relation Age of Onset   Cancer Sister        cervical   Cancer Maternal Grandmother        colon    Crohn's disease Mother    Leukemia Mother    Hyperlipidemia Mother    Thyroid disease Mother    Stroke Mother    Diabetes Father    Hypertension Father    Hyperlipidemia Father    Diabetes Sister     Outpatient Encounter Medications as of 09/25/2023  Medication Sig   ALPRAZolam (XANAX) 0.5 MG tablet Take 0.5 mg by mouth 3 (three) times daily as needed for anxiety.   atorvastatin (LIPITOR) 10 MG tablet Take 10 mg by mouth daily.   conjugated estrogens (PREMARIN) vaginal cream Use 1 gm in vagina at bedtime for 2 weeks then 2-3 x weekly   estradiol (ESTRACE) 2 MG tablet Take 1 tablet (2 mg total) by mouth daily.   glipiZIDE (GLUCOTROL) 5 MG tablet Take by mouth daily before breakfast.   glucose blood (ONETOUCH VERIO) test strip Use as instructed to monitor glucose twice daily   losartan (COZAAR) 25 MG tablet Take 25 mg by mouth daily.   metFORMIN (GLUCOPHAGE-XR) 500 MG 24 hr tablet Take 1 tablet (500 mg total) by mouth daily after supper.   mirtazapine (REMERON) 30 MG tablet Take 45 mg by mouth at bedtime.   montelukast (SINGULAIR) 10 MG tablet TAKE (1) TABLET BY MOUTH AT BEDTIME.   Multiple Vitamin (MULTIVITAMIN) tablet Take by mouth daily. Freedavite Multi Vit   omeprazole (PRILOSEC) 20 MG capsule Take 40 mg by mouth daily.   Probiotic Product (ALIGN PO) Take by mouth.   progesterone (PROMETRIUM) 200 MG capsule Take 1 capsule (200 mg total) by mouth daily.   RESTASIS MULTIDOSE 0.05 % ophthalmic emulsion    valACYclovir (VALTREX) 500 MG tablet Take 500 mg by mouth daily.   busPIRone (BUSPAR) 15 MG tablet Take 30 mg by mouth 2 (two) times daily.   [DISCONTINUED] Calcium Citrate (CITRACAL PO) Take by mouth. (Patient not taking: Reported on 09/25/2023)   [DISCONTINUED] cetirizine (ZYRTEC ALLERGY) 10 MG tablet Take 1 tablet (10 mg total) by mouth daily. (Patient not taking: Reported on 09/25/2023)   [DISCONTINUED] fluvoxaMINE (LUVOX) 50 MG tablet Take 150 mg by mouth. (Patient not taking:  Reported on 09/25/2023)   [DISCONTINUED] hydrochlorothiazide (MICROZIDE) 12.5 MG capsule Take 1 capsule (12.5 mg total) by mouth daily. (Patient not taking: Reported on 09/25/2023)   [DISCONTINUED] Multiple Vitamins-Minerals (HAIR SKIN NAILS PO) Take by mouth. (Patient not taking: Reported on 09/25/2023)   [DISCONTINUED] Multiple Vitamins-Minerals (OCUVITE PO) Take by mouth daily. (Patient not taking: Reported on 09/25/2023)   [DISCONTINUED] tirzepatide [facility])  2.5 MG/0.5ML Pen Inject 2.5 mg into the skin once a week. (Patient not taking: Reported on 09/25/2023)   No facility-administered encounter medications on file as of 09/25/2023.    ALLERGIES: Allergies  Allergen Reactions   Mounjaro [Tirzepatide] Nausea And Vomiting    Patient also had gas, lightheadedness.   Bacitracin-Polymyxin B Other (See Comments)    Cause wound to become infected Will actually cause infection instead of healing the area it has been applied to.   Codeine Nausea Only and Nausea And Vomiting   Neomycin-Bacitracin Zn-Polymyx Other (See Comments)    Will actually cause infection instead of healing the area it has been applied to.   Penicillins Rash    Starts at feet and works its way up the body.    Metformin Nausea And Vomiting    With dizziness   Neomycin-Polymyxin-Gramicidin Other (See Comments)    Cause wound to become infected   Roxicet [Oxycodone-Acetaminophen] Itching   Sulfonamide Derivatives     REACTION: UNKNOWN REACTION   Penicillin G Rash   Sulfamethoxazole Rash    VACCINATION STATUS:  There is no immunization history on file for this patient.  Diabetes She presents for her follow-up diabetic visit. She has type 2 diabetes mellitus. Onset time: diagnosed initially prior to gastric bypass in 2010, went into remission for a time, now it is back. Her disease course has been fluctuating. There are no hypoglycemic associated symptoms. There are no hypoglycemic complications. (gastroparesis) Risk  factors for coronary artery disease include diabetes mellitus, family history, hypertension and obesity. Current diabetic treatment includes oral agent (monotherapy). She is compliant with treatment most of the time. Her weight is fluctuating minimally. She is following a generally healthy diet. When asked about meal planning, she reported none. She has not had a previous visit with a dietitian. She participates in exercise intermittently. Her home blood glucose trend is fluctuating minimally. Her overall blood glucose range is >200 mg/dl. (She presents today with her logs showing above target glycemic profile overall.  Her POCT A1c today is 7.5%, increasing from last visit of 7%.  She has worked on diet and exercise.  Her night time readings happen to be better than her next morning readings, suspicious for liver dumping vs dawn phenomenon.  She did not tolerate the starter dose of Mounjaro due to severe GI side effects.) An ACE inhibitor/angiotensin II receptor blocker is being taken. She does not see a podiatrist.Eye exam is current.     Review of systems  Constitutional: + steadily decreasing body weight, current Body mass index is 30.11 kg/m., no fatigue, no subjective hyperthermia, no subjective hypothermia Eyes: no blurry vision, no xerophthalmia ENT: no sore throat, no nodules palpated in throat, no dysphagia/odynophagia, no hoarseness Cardiovascular: no chest pain, no shortness of breath, no palpitations, no leg swelling Respiratory: no cough, no shortness of breath Gastrointestinal: no nausea/vomiting/diarrhea Musculoskeletal: no muscle/joint aches Skin: no rashes, no hyperemia Neurological: no tremors, no numbness, no tingling, no dizziness Psychiatric: no depression, no anxiety  Objective:     BP 100/78 (BP Location: Left Arm, Patient Position: Sitting, Cuff Size: Large)   Ht 5\' 3"  (1.6 m)   Wt 170 lb (77.1 kg)   LMP 10/18/2015   BMI 30.11 kg/m   Wt Readings from Last 3  Encounters:  09/25/23 170 lb (77.1 kg)  09/20/23 169 lb 12.8 oz (77 kg)  06/27/23 172 lb (78 kg)     BP Readings from Last 3 Encounters:  09/25/23 100/78  09/20/23  127/89  06/27/23 122/80      Physical Exam- Limited  Constitutional:  Body mass index is 30.11 kg/m. , not in acute distress, normal state of mind Eyes:  EOMI, no exophthalmos Musculoskeletal: no gross deformities, strength intact in all four extremities, no gross restriction of joint movements Skin:  no rashes, no hyperemia Neurological: no tremor with outstretched hands   Diabetic Foot Exam - Simple   No data filed      CMP ( most recent) CMP     Component Value Date/Time   NA 140 12/08/2017 0256   NA 143 12/24/2014 1432   K 3.8 12/08/2017 0256   CL 106 12/08/2017 0256   CO2 25 12/08/2017 0256   GLUCOSE 107 (H) 12/08/2017 0256   BUN 24 (A) 06/02/2023 0000   CREATININE 0.8 06/02/2023 0000   CREATININE 0.80 12/08/2017 0256   CREATININE 0.69 08/15/2012 0835   CALCIUM 10.2 06/02/2023 0000   PROT 7.1 12/24/2014 1432   ALBUMIN 4.5 12/24/2014 1432   AST 32 12/24/2014 1432   ALT 44 (H) 12/24/2014 1432   ALKPHOS 98 12/24/2014 1432   BILITOT 0.3 12/24/2014 1432   EGFR >90 06/02/2023 0000   GFRNONAA >60 12/08/2017 0256     Diabetic Labs (most recent): Lab Results  Component Value Date   HGBA1C 7.5 (A) 09/25/2023   HGBA1C 7.0 (A) 06/27/2023   HGBA1C 7.9 (A) 03/15/2023   MICROALBUR 80mg /L 06/27/2023     Lipid Panel ( most recent) Lipid Panel     Component Value Date/Time   CHOL 138 08/15/2012 0835   TRIG 110 06/02/2023 0000   HDL 39 (L) 08/15/2012 0835   CHOLHDL 3.5 08/15/2012 0835   VLDL 17 08/15/2012 0835   LDLCALC 93 06/02/2023 0000      Lab Results  Component Value Date   TSH 2.00 06/02/2023   TSH 0.906 12/24/2014   TSH 1.447 08/04/2011           Assessment & Plan:   1) Type 2 diabetes mellitus without complication, without long-term current use of insulin (HCC)  She  presents today with her logs showing above target glycemic profile overall.  Her POCT A1c today is 7.5%, increasing from last visit of 7%.  She has worked on diet and exercise.  Her night time readings happen to be better than her next morning readings, suspicious for liver dumping vs dawn phenomenon.  She did not tolerate the starter dose of Mounjaro due to severe GI side effects.  - Melody Johnson has currently uncontrolled symptomatic type 2 DM.   -Recent labs reviewed.  - I had a long discussion with her about the progressive nature of diabetes and the pathology behind its complications. -her diabetes is complicated by gastroparesis and she remains at a high risk for more acute and chronic complications which include CAD, CVA, CKD, retinopathy, and neuropathy. These are all discussed in detail with her.  The following Lifestyle Medicine recommendations according to American College of Lifestyle Medicine [facility]) were discussed and offered to patient and she agrees to start the journey:  A. Whole Foods, Plant-based plate comprising of fruits and vegetables, plant-based proteins, whole-grain carbohydrates was discussed in detail with the patient.   A list for source of those nutrients were also provided to the patient.  Patient will use only water or unsweetened tea for hydration. B.  The need to stay away from risky substances including alcohol, smoking; obtaining 7 to 9 hours of restorative sleep, at least 150 minutes  of moderate intensity exercise weekly, the importance of healthy social connections,  and stress reduction techniques were discussed. C.  A full color page of  Calorie density of various food groups per pound showing examples of each food groups was provided to the patient.  - Nutritional counseling repeated at each appointment due to patients tendency to fall back in to old habits.  - The patient admits there is a room for improvement in their diet and drink choices. -  Suggestion  is made for the patient to avoid simple carbohydrates from their diet including Cakes, Sweet Desserts / Pastries, Ice Cream, Soda (diet and regular), Sweet Tea, Candies, Chips, Cookies, Sweet Pastries, Store Bought Juices, Alcohol in Excess of 1-2 drinks a day, Artificial Sweeteners, Coffee Creamer, and "Sugar-free" Products. This will help patient to have stable blood glucose profile and potentially avoid unintended weight gain.   - I encouraged the patient to switch to unprocessed or minimally processed complex starch and increased protein intake (animal or plant source), fruits, and vegetables.   - Patient is advised to stick to a routine mealtimes to eat 3 meals a day and avoid unnecessary snacks (to snack only to correct hypoglycemia).  - I have approached her with the following individualized plan to manage her diabetes and patient agrees:   - she is advised to continue Glipizide 10 mg XL daily at breakfast.  Will re-try low dose Metformin 500 mg ER daily after supper (hopefully will help prevent GI upset).  She is concerned with cost due to high deductible insurance plan.  -she is encouraged to continue monitoring glucose 2 times daily, before breakfast and before bed, and to call the clinic if she has readings less than 70 or above 300 for 3 tests in a row.  - Adjustment parameters are given to her for hypo and hyperglycemia in writing.  - she is not a candidate for Metformin due to GI side effects.  -She has been prescribed both Trulicity (did better on this one vs Ozempic) and Ozempic in the past but could not afford the copay.  She did try the [facility] most recently but could not tolerate it due to GI SE as well.  - Specific targets for  A1c; LDL, HDL, and Triglycerides were discussed with the patient.  2) Blood Pressure /Hypertension:  her blood pressure is controlled to target.   she is advised to continue her current medications including Losartan 25 mg p.o. daily with  breakfast.  3) Lipids/Hyperlipidemia:    Review of her recent lipid panel from 06/02/23 showed controlled LDL at 93 .  she is advised to continue Lipitor 20 mg daily at bedtime.  Side effects and precautions discussed with her.    4)  Weight/Diet:  her Body mass index is 30.11 kg/m.  -  clearly complicating her diabetes care.   she is a candidate for weight loss. She did have gastric bypass surgery in the past but could stand to lose a bit more weight.  I discussed with her the fact that loss of 5 - 10% of her  current body weight will have the most impact on her diabetes management.  Exercise, and detailed carbohydrates information provided  -  detailed on discharge instructions.  5) Chronic Care/Health Maintenance: -she is on ACEI/ARB and Statin medications and is encouraged to initiate and continue to follow up with Ophthalmology, Dentist, Podiatrist at least yearly or according to recommendations, and advised to stay away from smoking. I have recommended yearly  flu vaccine and pneumonia vaccine at least every 5 years; moderate intensity exercise for up to 150 minutes weekly; and sleep for at least 7 hours a day.  - she is advised to maintain close follow up with Odis Luster, PA-C for primary care needs, as well as her other providers for optimal and coordinated care.    I spent  44  minutes in the care of the patient today including review of labs from CMP, Lipids, Thyroid Function, Hematology (current and previous including abstractions from other facilities); face-to-face time discussing  her blood glucose readings/logs, discussing hypoglycemia and hyperglycemia episodes and symptoms, medications doses, her options of short and long term treatment based on the latest standards of care / guidelines;  discussion about incorporating lifestyle medicine;  and documenting the encounter. Risk reduction counseling performed per USPSTF guidelines to reduce obesity and cardiovascular risk factors.      Please refer to Patient Instructions for Blood Glucose Monitoring and Insulin/Medications Dosing Guide"  in media tab for additional information. Please  also refer to " Patient Self Inventory" in the Media  tab for reviewed elements of pertinent patient history.  Melody Johnson participated in the discussions, expressed understanding, and voiced agreement with the above plans.  All questions were answered to her satisfaction. she is encouraged to contact clinic should she have any questions or concerns prior to her return visit.     Follow up plan: - Return in about 4 months (around 01/25/2024) for Diabetes F/U with A1c in office, No previsit labs, Bring meter and logs.   Ronny Bacon, [facility] [address] [place], [place] [address] Phone: [phone] Fax: [phone]  09/25/2023, 8:45 AM

## 2023-09-27 ENCOUNTER — Encounter: Payer: Self-pay | Admitting: *Deleted

## 2023-10-09 ENCOUNTER — Encounter: Payer: Self-pay | Admitting: Nurse Practitioner

## 2023-11-10 ENCOUNTER — Other Ambulatory Visit (HOSPITAL_COMMUNITY)
Admission: RE | Admit: 2023-11-10 | Discharge: 2023-11-10 | Disposition: A | Discharge status: Home / Self Care | Source: Ambulatory Visit | Attending: Obstetrics & Gynecology | Admitting: Obstetrics & Gynecology

## 2023-11-10 ENCOUNTER — Other Ambulatory Visit: Admitting: *Deleted

## 2023-11-10 DIAGNOSIS — N898 Other specified noninflammatory disorders of vagina: Secondary | ICD-10-CM | POA: Diagnosis present

## 2023-11-10 DIAGNOSIS — R350 Frequency of micturition: Secondary | ICD-10-CM | POA: Diagnosis not present

## 2023-11-10 LAB — POCT URINALYSIS DIPSTICK OB
Blood, UA: NEGATIVE
Glucose, UA: NEGATIVE
Ketones, UA: NEGATIVE
Leukocytes, UA: NEGATIVE
Nitrite, UA: NEGATIVE
POC,PROTEIN,UA: NEGATIVE

## 2023-11-10 NOTE — Progress Notes (Signed)
   NURSE VISIT- VAGINITIS  SUBJECTIVE:  Melody Johnson is a 58 y.o. G0P0 GYN patientfemale here for a vaginal swab for vaginitis screening.  She reports the following symptoms: vulvar itching. Has tried Monistat cream and baby powder to the area but still having itching. Hx diabetes. Denies abnormal vaginal bleeding, significant pelvic pain, fever.Also has urinary frequency.  OBJECTIVE:  LMP 10/18/2015   Appears well, in no apparent distress  ASSESSMENT: Vaginal swab for vaginitis screening Urine dip negative  PLAN: Self-collected vaginal probe for Bacterial Vaginosis, Yeast sent to lab Pt declined for urine to be sent to lab since dip negative Treatment: to be determined once results are received however, patient requesting script for Diflucan  to get her through the weekend. Follow-up as needed if symptoms persist/worsen, or new symptoms develop  Laverne Potter  11/10/2023 10:06 AM

## 2023-11-13 ENCOUNTER — Ambulatory Visit: Payer: Self-pay | Admitting: Adult Health

## 2023-11-13 LAB — CERVICOVAGINAL ANCILLARY ONLY
Bacterial Vaginitis (gardnerella): NEGATIVE
Candida Glabrata: NEGATIVE
Candida Vaginitis: NEGATIVE
Comment: NEGATIVE
Comment: NEGATIVE
Comment: NEGATIVE

## 2023-11-14 ENCOUNTER — Inpatient Hospital Stay: Attending: Oncology | Admitting: Oncology

## 2023-11-14 ENCOUNTER — Inpatient Hospital Stay

## 2023-11-14 VITALS — BP 139/84 | HR 99 | Temp 98.7°F | Resp 20 | Ht 62.6 in | Wt 168.0 lb

## 2023-11-14 DIAGNOSIS — E611 Iron deficiency: Secondary | ICD-10-CM

## 2023-11-14 DIAGNOSIS — Z79899 Other long term (current) drug therapy: Secondary | ICD-10-CM | POA: Diagnosis not present

## 2023-11-14 DIAGNOSIS — Z9884 Bariatric surgery status: Secondary | ICD-10-CM | POA: Diagnosis not present

## 2023-11-14 DIAGNOSIS — E538 Deficiency of other specified B group vitamins: Secondary | ICD-10-CM | POA: Diagnosis not present

## 2023-11-14 DIAGNOSIS — D509 Iron deficiency anemia, unspecified: Secondary | ICD-10-CM | POA: Insufficient documentation

## 2023-11-14 MED ORDER — VITAMIN B-12 1000 MCG PO TABS: 1000.0000 ug | ORAL_TABLET | Freq: Every day | ORAL | 2 refills | Status: DC

## 2023-11-14 NOTE — Progress Notes (Signed)
 [facility] at [facility]  HEMATOLOGY NEW VISIT  Margaret Sharp, PA-C  REASON FOR REFERRAL: Iron deficiency   HISTORY OF PRESENT ILLNESS: Melody Johnson 58 y.o. female referred for iron deficiency.   She experiences significant fatigue and has a known history of iron deficiency. She underwent bariatric surgery in 2013, which is likely contributing to her malabsorption issues. A gastrointestinal workup, including a colonoscopy and endoscopy in September 2024, showed no bleeding or polyps. No blood in stools or significant weight loss, although she notes a change in taste and some unintentional weight loss after previously attempting to lose weight.  She is currently taking a multivitamin with iron but finds it insufficient to maintain her iron levels. She cannot tolerate oral iron supplements due to nausea and vomiting, despite trying various formulations and methods of ingestion, such as slow-release forms and taking them with meals. She experiences restless legs at night, difficulty sleeping, and averages only three hours of sleep per night. No shortness of breath, although she notes more shortness of breath when cleaning the house. She has periods of sweating with cold hands and feet.  Her past medical history includes heavy menstruation prior to menopause, but she did not require blood transfusions. She reports occasional nausea and vomiting, but no abdominal pain.   I have reviewed the past medical history, past surgical history, social history and family history with the patient   ALLERGIES:  is allergic to [facility] [tirzepatide], bacitracin-polymyxin b, codeine, neomycin -bacitracin zn-polymyx, penicillins, metformin , neomycin -polymyxin-gramicidin, roxicet [oxycodone -acetaminophen ], sulfonamide derivatives, penicillin g, and sulfamethoxazole.  MEDICATIONS:  Current Outpatient Medications  Medication Sig Dispense Refill   ALPRAZolam (XANAX) 0.5 MG tablet  Take 0.5 mg by mouth 3 (three) times daily as needed for anxiety.     atorvastatin (LIPITOR) 20 MG tablet Take 20 mg by mouth daily.     conjugated estrogens  (PREMARIN ) vaginal cream Use 1 gm in vagina at bedtime for 2 weeks then 2-3 x weekly 30 g 3   cyanocobalamin (VITAMIN B12) 1000 MCG tablet Take 1 tablet (1,000 mcg total) by mouth daily. 90 tablet 2   estradiol  (ESTRACE ) 2 MG tablet Take 1 tablet (2 mg total) by mouth daily. 90 tablet 3   glipiZIDE (GLUCOTROL) 5 MG tablet Take by mouth daily before breakfast.     glucose blood (ONETOUCH VERIO) test strip Use as instructed to monitor glucose twice daily 100 each 12   losartan (COZAAR) 25 MG tablet Take 25 mg by mouth daily.     Multiple Vitamin (MULTIVITAMIN) tablet Take by mouth daily. Freedavite Multi Vit     omeprazole (PRILOSEC) 20 MG capsule Take 40 mg by mouth daily.     Probiotic Product (ALIGN PO) Take by mouth.     progesterone  (PROMETRIUM ) 200 MG capsule Take 1 capsule (200 mg total) by mouth daily. 90 capsule 4   RESTASIS  MULTIDOSE 0.05 % ophthalmic emulsion   11   valACYclovir (VALTREX) 500 MG tablet Take 500 mg by mouth daily.     No current facility-administered medications for this visit.     REVIEW OF SYSTEMS:   Constitutional: Denies fevers, chills or night sweats Eyes: Denies blurriness of vision Ears, nose, mouth, throat, and face: Denies mucositis or sore throat Respiratory: Denies cough, dyspnea or wheezes Cardiovascular: Denies palpitation, chest discomfort or lower extremity swelling Gastrointestinal:  Denies nausea, heartburn or change in bowel habits Skin: Denies abnormal skin rashes Lymphatics: Denies new lymphadenopathy or easy bruising Neurological:Denies numbness, tingling or new weaknesses  Behavioral/Psych: Mood is stable, no new changes  All other systems were reviewed with the patient and are negative.  PHYSICAL EXAMINATION:   Vitals:   11/14/23 1424  BP: 139/84  Pulse: 99  Resp: 20  Temp:  98.7 F (37.1 C)  SpO2: 100%    GENERAL:alert, no distress and comfortable SKIN: skin color, texture, turgor are normal, no rashes or significant lesions LUNGS: clear to auscultation and percussion with normal breathing effort HEART: regular rate & rhythm and no murmurs and no lower extremity edema ABDOMEN:abdomen soft, non-tender and normal bowel sounds Musculoskeletal:no cyanosis of digits and no clubbing  NEURO: alert & oriented x 3 with fluent speech  LABORATORY DATA:  I have reviewed the data as listed     [facility] Outside Information    Contains abnormal data Anemia Profile Specimen: Blood - Venous blood specimen (specimen) Component Ref Range & Units 2 wk ago  Iron 50 - 212 ug/dL 51  Transferrin 811 - 914 mg/dL 782 High   Ferritin 11 - 307 ng/mL 5 Low   Total Iron Binding Capacity (TIBC) 290 - 518 ug/dL 956 High   Transferrin Saturation 15 - 45 % 9 Low   Resulting Agency [facility] [place] [facility](CLIA# [id number])  Specimen Collected: 10/25/23 14:35   Performed by: Harrie Limb [place] [facility](CLIA# [id number]) Last Resulted: 10/25/23 19:16  Received From: [facility]  Result Received: 11/10/23 08:19   View Encounter   Recent Data from [facility] Related to Anemia Profile Component 10/25/23 09/01/23 06/02/23 11/28/22  Iron 51 81 100 100  Transferrin 404 High  374 High  369 High  368 High   Ferritin 5 Low  5 Low  5 Low  6 Low   Total Iron Binding Capacity (TIBC) 578 High  535 High  528 High  526 High   Transferrin Saturation 9 Low  15 19 19      [facility] Outside Information   Vitamin B12 Specimen: Blood - Venous blood specimen (specimen) Component Ref Range & Units 2 wk ago Comments  Vitamin B-12 180 - 914 pg/mL 300 Normal Range: 180-914 pg/mL Indeterminate Range: 145-180 pg/mL Deficient Range: <145 pg/mL  Resulting Agency [facility] [place] [facility](CLIA# [id number])   Specimen Collected: 10/25/23  14:35   Performed by: Harrie Limb [place] [facility](CLIA# [id number]) Last Resulted: 10/25/23 19:16  Received From: [facility]  Result Received: 10/30/23 16:46   View Encounter      [facility] Outside Information   Folate Specimen: Blood - Venous blood specimen (specimen) Component Ref Range & Units 2 wk ago Comments  Folate >5.8 ng/mL >24.8 >5.8 ng/mL: Normal 4.0-5.8 ng/mL: Indeterminate <4.0 ng/mL: Deficient  Resulting Agency AH [place] [facility](CLIA# [id number])   Specimen Collected: 10/25/23 14:35   Performed by: Harrie Limb  [facility](CLIA# [id number]) Last Resulted: 10/25/23 19:16  Received From: [facility]  Result Received: 10/30/23 16:46   View Encounter      [facility] Outside Information    Contains abnormal data CBC with Differential Specimen: Blood - Venous blood specimen (specimen) Component Ref Range & Units 2 wk ago  WBC 4.40 - 11.00 10*3/uL 7.7  RBC 4.10 - 5.10 10*6/uL 4.05 Low   Hemoglobin 12.3 - 15.3 g/dL 41.6  Hematocrit 60.6 - 44.6 % 38.7  Mean Corpuscular Volume (MCV) 80.0 - 96.0 fL 95.5  Mean Corpuscular Hemoglobin (MCH) 27.5 - 33.2 pg 31.7  Mean Corpuscular Hemoglobin  Conc (MCHC) 33.0 - 37.0 g/dL 81.1  Red Cell Distribution Width (RDW) 12.3 - 17.0 % 13.6  Platelet Count (PLT) 150 - 450 10*3/uL 478 High   Mean Platelet Volume (MPV) 6.8 - 10.2 fL 8.2  Neutrophils % % 56  Lymphocytes % % 35  Monocytes % % 6  Eosinophils % % 2  Basophils % % 1  nRBC % % 0  Neutrophils Absolute 1.80 - 7.80 10*3/uL 4.3  Lymphocytes # 1.00 - 4.80 10*3/uL 2.7  Monocytes # 0.00 - 0.80 10*3/uL 0.5  Eosinophils # 0.00 - 0.50 10*3/uL 0.1  Basophils # 0.00 - 0.20 10*3/uL 0  nRBC Absolute <=0.00 10*3/uL 0  Resulting Agency AH [place] [facility](CLIA# [id number])  Specimen Collected: 10/25/23 14:35   Performed by: Harrie Limb  [facility](CLIA# [id number])  Last Resulted: 10/25/23 18:36  Received From: [facility]  Result Received: 11/10/23 08:19   View Encounter    Recent Data from [facility] Related to CBC with Differential Component 10/25/23 09/01/23 06/02/23 12/19/22 11/28/22  WBC 7.7 6.7 6.9 7.20 7.30  RBC 4.05 Low  4.19 4.17 4.33 4.17  Hemoglobin 12.9 13.6 13.8 14.0 13.3  Hematocrit 38.7 40.5 40.5 42.0 38.9  Mean Corpuscular Volume (MCV) 95.5 96.6 High  97.2 High  96.9 High  93.4  Mean Corpuscular Hemoglobin (MCH) 31.7 32.4 33 32.2 31.9  Mean Corpuscular Hemoglobin Conc (MCHC) 33.2 33.5 34 33.2 34.2  Red Cell Distribution Width (RDW) 13.6 13.3 12.8 14.6 14.6  Platelet Count (PLT) 478 High  424 436 310 395  Mean Platelet Volume (MPV) 8.2 8.5 8.5 9.2 8.9  Neutrophils % 56 54 57 55 62  Lymphocytes % 35 36 29 34 27  Monocytes % 6 8 11 8 8   Eosinophils % 2 2 3 3 2   Basophils % 1 1 1 1 1   nRBC % 0 0 0 0 0  Neutrophils Absolute 4.3 3.6 3.9 4.00 4.50  Lymphocytes # 2.7 2.4 2 2.40 2.00  Monocytes # 0.5 0.5 0.7 0.50 0.60  Eosinophils # 0.1 0.2 0.2 0.20 0.20  Basophils # 0 0 0 0.00 0.00  nRBC Absolute 0 0 0 0.00 0.00      RADIOGRAPHIC STUDIES: I have personally reviewed the radiological images as listed and agreed with the findings in the report.  None to review   ASSESSMENT & PLAN:  Patient is a 58 y.o. 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 Last colonoscopy/endoscopy: 02/2023 with no evidence of active bleeding.   -We discussed some of the risks, benefits, and alternatives of intravenous iron infusions. The patient is symptomatic from anemia and the iron level is critically low. She tolerated oral iron supplement poorly and desires to achieve higher levels of iron faster for adequate hematopoesis. Some of the side-effects to be expected including risks of infusion reactions, phlebitis, headaches, nausea and fatigue.  The patient  is willing to proceed. Patient education material was dispensed. Goal is to keep ferritin level greater than 50 and resolution of anemia - Patient cannot tolerate oral iron with significant nausea, vomiting. - Can consider capsule endoscopy if iron deficiency persists after replacement  Return to clinic in 6 weeks with labs to assess response to IV iron   Vitamin B12 deficiency Patient has mild vitamin B12 deficiency with levels less than 400  - Start oral vitamin B12 1000 mcg daily   Orders Placed This  Encounter  Procedures   Ferritin    Standing Status:   Future    Expected Date:   12/25/2023    Expiration Date:   11/16/2024   Folate    Standing Status:   Future    Expected Date:   12/25/2023    Expiration Date:   11/16/2024   Vitamin B12    Standing Status:   Future    Expected Date:   12/25/2023    Expiration Date:   11/16/2024   CBC with Differential/Platelet    Standing Status:   Future    Expected Date:   12/25/2023    Expiration Date:   11/16/2024   Comprehensive metabolic panel with GFR    Standing Status:   Future    Expected Date:   12/25/2023    Expiration Date:   11/16/2024   Iron and TIBC    Standing Status:   Future    Expected Date:   12/25/2023    Expiration Date:   11/16/2024    The total time spent in the appointment was 40 minutes encounter with patients including review of chart and various tests results, discussions about plan of care and coordination of care plan   All questions were answered. The patient knows to call the clinic with any problems, questions or concerns. No barriers to learning was detected.   Eduardo Grade, MD 5/23/20252:45 PM

## 2023-11-14 NOTE — Patient Instructions (Addendum)
 VISIT SUMMARY:  Today, we discussed your ongoing issues with iron deficiency, restless legs syndrome, borderline low B12 levels, and insomnia. We reviewed your recent gastrointestinal workup and your history of bariatric surgery, which is likely contributing to your malabsorption issues. We have developed a plan to address each of these concerns.  YOUR PLAN:  -IRON DEFICIENCY: Iron deficiency means your body does not have enough iron, which is essential for making red blood cells. This is likely due to malabsorption after your bariatric surgery. Since you cannot tolerate oral iron supplements, we will administer two doses of IV iron after insurance approval. We will recheck your iron levels in six weeks. If gastrointestinal issues persist, we may consider a capsule endoscopy.  -RESTLESS LEGS SYNDROME: Restless legs syndrome is a condition that causes an uncontrollable urge to move your legs, usually due to discomfort. This may be related to your iron deficiency. Administering IV iron may help alleviate these symptoms.  -B12 DEFICIENCY: B12 deficiency means your body does not have enough vitamin B12, which is important for nerve function and making red blood cells. This is likely due to malabsorption after your bariatric surgery. We recommend taking a B12 supplement of 1000 micrograms daily, which you can get over the counter. We will document this supplement in your file.  INSTRUCTIONS:  We will administer two doses of IV iron after insurance approval and recheck your iron levels in six weeks. Please start taking a B12 supplement of 1000 micrograms daily, which you can get over the counter. Additionally, consider taking melatonin to help with your sleep. If your gastrointestinal issues persist, we may consider a capsule endoscopy.

## 2023-11-16 DIAGNOSIS — D509 Iron deficiency anemia, unspecified: Secondary | ICD-10-CM | POA: Insufficient documentation

## 2023-11-17 ENCOUNTER — Encounter: Payer: Self-pay | Admitting: Oncology

## 2023-11-17 DIAGNOSIS — E611 Iron deficiency: Secondary | ICD-10-CM | POA: Insufficient documentation

## 2023-11-17 DIAGNOSIS — E538 Deficiency of other specified B group vitamins: Secondary | ICD-10-CM | POA: Insufficient documentation

## 2023-11-17 NOTE — Assessment & Plan Note (Signed)
 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 Last colonoscopy/endoscopy: 02/2023 with no evidence of active bleeding.   -We discussed some of the risks, benefits, and alternatives of intravenous iron infusions. The patient is symptomatic from anemia and the iron level is critically low. She tolerated oral iron supplement poorly and desires to achieve higher levels of iron faster for adequate hematopoesis. Some of the side-effects to be expected including risks of infusion reactions, phlebitis, headaches, nausea and fatigue.  The patient is willing to proceed. Patient education material was dispensed. Goal is to keep ferritin level greater than 50 and resolution of anemia - Patient cannot tolerate oral iron with significant nausea, vomiting. - Can consider capsule endoscopy if iron deficiency persists after replacement  Return to clinic in 6 weeks with labs to assess response to IV iron

## 2023-11-17 NOTE — Assessment & Plan Note (Signed)
 Patient has mild vitamin B12 deficiency with levels less than 400  - Start oral vitamin B12 1000 mcg daily

## 2023-11-22 ENCOUNTER — Inpatient Hospital Stay

## 2023-11-22 VITALS — BP 117/77 | HR 74 | Temp 98.0°F | Resp 18

## 2023-11-22 DIAGNOSIS — D509 Iron deficiency anemia, unspecified: Secondary | ICD-10-CM

## 2023-11-22 DIAGNOSIS — E611 Iron deficiency: Secondary | ICD-10-CM | POA: Diagnosis not present

## 2023-11-22 MED ORDER — ACETAMINOPHEN 325 MG PO TABS
650.0000 mg | ORAL_TABLET | Freq: Once | ORAL | Status: AC
  Administered 2023-11-22: 650 mg via ORAL
  Filled 2023-11-22: qty 2

## 2023-11-22 MED ORDER — SODIUM CHLORIDE 0.9 % IV SOLN: INTRAVENOUS | Status: DC

## 2023-11-22 MED ORDER — CETIRIZINE HCL 10 MG PO TABS
10.0000 mg | ORAL_TABLET | Freq: Once | ORAL | Status: AC
  Administered 2023-11-22: 10 mg via ORAL
  Filled 2023-11-22: qty 1

## 2023-11-22 MED ORDER — FERUMOXYTOL INJECTION 510 MG/17 ML
510.0000 mg | Freq: Once | INTRAVENOUS | Status: AC
  Administered 2023-11-22: 510 mg via INTRAVENOUS
  Filled 2023-11-22: qty 510

## 2023-11-22 NOTE — Progress Notes (Signed)
 Patient tolerated iron infusion with no complaints voiced.  Peripheral IV site clean and dry with good blood return noted before and after infusion.  Band aid applied.  VSS with discharge and left in satisfactory condition with no s/s of distress noted.

## 2023-11-22 NOTE — Patient Instructions (Addendum)
 Ferumoxytol Injection What is this medication? FERUMOXYTOL (FER ue MOX i tol) treats low levels of iron in your body (iron deficiency anemia). Iron is a mineral that plays an important role in making red blood cells, which carry oxygen from your lungs to the rest of your body. This medicine may be used for other purposes; ask your health care provider or pharmacist if you have questions. COMMON BRAND NAME(S): Feraheme What should I tell my care team before I take this medication? They need to know if you have any of these conditions: Anemia not caused by low iron levels High levels of iron in the blood Magnetic resonance imaging (MRI) test scheduled An unusual or allergic reaction to iron, other medications, foods, dyes, or preservatives Pregnant or trying to get pregnant Breastfeeding How should I use this medication? This medication is injected into a vein. It is given by your care team in a hospital or clinic setting. Talk to your care team the use of this medication in children. Special care may be needed. Overdosage: If you think you have taken too much of this medicine contact a poison control center or emergency room at once. NOTE: This medicine is only for you. Do not share this medicine with others. What if I miss a dose? It is important not to miss your dose. Call your care team if you are unable to keep an appointment. What may interact with this medication? Other iron products This list may not describe all possible interactions. Give your health care provider a list of all the medicines, herbs, non-prescription drugs, or dietary supplements you use. Also tell them if you smoke, drink alcohol , or use illegal drugs. Some items may interact with your medicine. What should I watch for while using this medication? Visit your care team for regular checks on your progress. Tell your care team if your symptoms do not start to get better or if they get worse. You may need blood work done  while you are taking this medication. You may need to eat more foods that contain iron. Talk to your care team. Foods that contain iron include whole grains or cereals, dried fruits, beans, peas, leafy green vegetables, and organ meats (liver, kidney). What side effects may I notice from receiving this medication? Side effects that you should report to your care team as soon as possible: Allergic reactions--skin rash, itching, hives, swelling of the face, lips, tongue, or throat Low blood pressure--dizziness, feeling faint or lightheaded, blurry vision Shortness of breath Side effects that usually do not require medical attention (report to your care team if they continue or are bothersome): Flushing Headache Joint pain Muscle pain Nausea Pain, redness, or irritation at injection site This list may not describe all possible side effects. Call your doctor for medical advice about side effects. You may report side effects to FDA at 1-800-FDA-1088. Where should I keep my medication? This medication is given in a hospital or clinic. It will not be stored at home. NOTE: This sheet is a summary. It may not cover all possible information. If you have questions about this medicine, talk to your doctor, pharmacist, or health care provider.  2024 Elsevier/Gold Standard (2023-02-01 00:00:00)  [facility] [place] - A DEPT OF [facility]  Discharge Instructions: Thank you for choosing [facility] to provide your oncology and hematology care.  If you have a lab appointment with the Cancer Center - please note that after April 8th, 2024,  all labs will be drawn in the cancer center.  You do not have to check in or register with the main entrance as you have in the past but will complete your check-in in the cancer center.  Wear comfortable clothing and clothing appropriate for easy access to any Portacath or PICC line.   We strive to give you quality time with your  provider. You may need to reschedule your appointment if you arrive late (15 or more minutes).  Arriving late affects you and other patients whose appointments are after yours.  Also, if you miss three or more appointments without notifying the office, you may be dismissed from the clinic at the provider's discretion.      For prescription refill requests, have your pharmacy contact our office and allow 72 hours for refills to be completed.       To help prevent nausea and vomiting after your treatment, we encourage you to take your nausea medication as directed.  BELOW ARE SYMPTOMS THAT SHOULD BE REPORTED IMMEDIATELY: *FEVER GREATER THAN 100.4 F (38 C) OR HIGHER *CHILLS OR SWEATING *NAUSEA AND VOMITING THAT IS NOT CONTROLLED WITH YOUR NAUSEA MEDICATION *UNUSUAL SHORTNESS OF BREATH *UNUSUAL BRUISING OR BLEEDING *URINARY PROBLEMS (pain or burning when urinating, or frequent urination) *BOWEL PROBLEMS (unusual diarrhea, constipation, pain near the anus) TENDERNESS IN MOUTH AND THROAT WITH OR WITHOUT PRESENCE OF ULCERS (sore throat, sores in mouth, or a toothache) UNUSUAL RASH, SWELLING OR PAIN  UNUSUAL VAGINAL DISCHARGE OR ITCHING   Items with * indicate a potential emergency and should be followed up as soon as possible or go to the Emergency Department if any problems should occur.  Please show the CHEMOTHERAPY ALERT CARD or IMMUNOTHERAPY ALERT CARD at check-in to the Emergency Department and triage nurse.  Should you have questions after your visit or need to cancel or reschedule your appointment, please contact [facility] [place] - A DEPT OF [facility] [phone]  and follow the prompts.  Office hours are 8:00 a.m. to 4:30 p.m. Monday - Friday. Please note that voicemails left after 4:00 p.m. may not be returned until the following business day.  We are closed weekends and major holidays. You have access to a nurse at all times for urgent questions. Please call  the main number to the clinic [phone] and follow the prompts.  For any non-urgent questions, you may also contact your provider using MyChart. We now offer e-Visits for anyone 54 and older to request care online for non-urgent symptoms. For details visit [email or web address].   Also download the MyChart app! Go to the app store, search "MyChart", open the app, select [place], and log in with your MyChart username and password.

## 2023-11-29 ENCOUNTER — Inpatient Hospital Stay

## 2023-11-29 ENCOUNTER — Inpatient Hospital Stay: Attending: Hematology

## 2023-11-29 VITALS — BP 125/82 | HR 78 | Temp 97.7°F | Resp 18

## 2023-11-29 DIAGNOSIS — D509 Iron deficiency anemia, unspecified: Secondary | ICD-10-CM | POA: Diagnosis present

## 2023-11-29 DIAGNOSIS — E611 Iron deficiency: Secondary | ICD-10-CM | POA: Insufficient documentation

## 2023-11-29 MED ORDER — SODIUM CHLORIDE 0.9 % IV SOLN: INTRAVENOUS | Status: DC

## 2023-11-29 MED ORDER — SODIUM CHLORIDE 0.9 % IV SOLN
510.0000 mg | Freq: Once | INTRAVENOUS | Status: AC
  Administered 2023-11-29: 510 mg via INTRAVENOUS
  Filled 2023-11-29: qty 510

## 2023-11-29 NOTE — Progress Notes (Signed)
 Patient presents today for iron infusion.  Patient is in satisfactory condition with no new complaints voiced.  Vital signs are stable.  IV placed in R arm.  IV flushed well with good blood return noted.  Tylenol  and Zyrtec  taken at 0815 prior to visit.  We will proceed with infusion per provider orders.    Patient tolerated infusion well with no complaints voiced.  Patient left ambulatory in stable condition.  Vital signs stable at discharge.  Follow up as scheduled.

## 2023-11-29 NOTE — Patient Instructions (Signed)
 [facility] - A DEPT OF [facility]  Discharge Instructions: Thank you for choosing [facility] to provide your oncology and hematology care.  If you have a lab appointment with the Cancer Center - please note that after April 8th, 2024, all labs will be drawn in the cancer center.  You do not have to check in or register with the main entrance as you have in the past but will complete your check-in in the cancer center.  Wear comfortable clothing and clothing appropriate for easy access to any Portacath or PICC line.   We strive to give you quality time with your provider. You may need to reschedule your appointment if you arrive late (15 or more minutes).  Arriving late affects you and other patients whose appointments are after yours.  Also, if you miss three or more appointments without notifying the office, you may be dismissed from the clinic at the provider's discretion.      For prescription refill requests, have your pharmacy contact our office and allow 72 hours for refills to be completed.    Today you received the following:  Feraheme.  Ferumoxytol Injection What is this medication? FERUMOXYTOL (FER ue MOX i tol) treats low levels of iron in your body (iron deficiency anemia). Iron is a mineral that plays an important role in making red blood cells, which carry oxygen from your lungs to the rest of your body. This medicine may be used for other purposes; ask your health care provider or pharmacist if you have questions. COMMON BRAND NAME(S): Feraheme What should I tell my care team before I take this medication? They need to know if you have any of these conditions: Anemia not caused by low iron levels High levels of iron in the blood Magnetic resonance imaging (MRI) test scheduled An unusual or allergic reaction to iron, other medications, foods, dyes, or preservatives Pregnant or trying to get pregnant Breastfeeding How should I  use this medication? This medication is injected into a vein. It is given by your care team in a hospital or clinic setting. Talk to your care team the use of this medication in children. Special care may be needed. Overdosage: If you think you have taken too much of this medicine contact a poison control center or emergency room at once. NOTE: This medicine is only for you. Do not share this medicine with others. What if I miss a dose? It is important not to miss your dose. Call your care team if you are unable to keep an appointment. What may interact with this medication? Other iron products This list may not describe all possible interactions. Give your health care provider a list of all the medicines, herbs, non-prescription drugs, or dietary supplements you use. Also tell them if you smoke, drink alcohol, or use illegal drugs. Some items may interact with your medicine. What should I watch for while using this medication? Visit your care team for regular checks on your progress. Tell your care team if your symptoms do not start to get better or if they get worse. You may need blood work done while you are taking this medication. You may need to eat more foods that contain iron. Talk to your care team. Foods that contain iron include whole grains or cereals, dried fruits, beans, peas, leafy green vegetables, and organ meats (liver, kidney). What side effects may I notice from receiving this medication? Side effects that you should  report to your care team as soon as possible: Allergic reactions--skin rash, itching, hives, swelling of the face, lips, tongue, or throat Low blood pressure--dizziness, feeling faint or lightheaded, blurry vision Shortness of breath Side effects that usually do not require medical attention (report to your care team if they continue or are bothersome): Flushing Headache Joint pain Muscle pain Nausea Pain, redness, or irritation at injection site This list  may not describe all possible side effects. Call your doctor for medical advice about side effects. You may report side effects to FDA at 1-800-FDA-1088. Where should I keep my medication? This medication is given in a hospital or clinic. It will not be stored at home. NOTE: This sheet is a summary. It may not cover all possible information. If you have questions about this medicine, talk to your doctor, pharmacist, or health care provider.  2024 Elsevier/Gold Standard (2023-02-01 00:00:00)    To help prevent nausea and vomiting after your treatment, we encourage you to take your nausea medication as directed.  BELOW ARE SYMPTOMS THAT SHOULD BE REPORTED IMMEDIATELY: *FEVER GREATER THAN 100.4 F (38 C) OR HIGHER *CHILLS OR SWEATING *NAUSEA AND VOMITING THAT IS NOT CONTROLLED WITH YOUR NAUSEA MEDICATION *UNUSUAL SHORTNESS OF BREATH *UNUSUAL BRUISING OR BLEEDING *URINARY PROBLEMS (pain or burning when urinating, or frequent urination) *BOWEL PROBLEMS (unusual diarrhea, constipation, pain near the anus) TENDERNESS IN MOUTH AND THROAT WITH OR WITHOUT PRESENCE OF ULCERS (sore throat, sores in mouth, or a toothache) UNUSUAL RASH, SWELLING OR PAIN  UNUSUAL VAGINAL DISCHARGE OR ITCHING   Items with * indicate a potential emergency and should be followed up as soon as possible or go to the Emergency Department if any problems should occur.  Please show the CHEMOTHERAPY ALERT CARD or IMMUNOTHERAPY ALERT CARD at check-in to the Emergency Department and triage nurse.  Should you have questions after your visit or need to cancel or reschedule your appointment, please contact [facility] [place] - A DEPT OF [facility] [phone]  and follow the prompts.  Office hours are 8:00 a.m. to 4:30 p.m. Monday - Friday. Please note that voicemails left after 4:00 p.m. may not be returned until the following business day.  We are closed weekends and major holidays. You have access to a  nurse at all times for urgent questions. Please call the main number to the clinic [phone] and follow the prompts.  For any non-urgent questions, you may also contact your provider using MyChart. We now offer e-Visits for anyone 39 and older to request care online for non-urgent symptoms. For details visit [email or web address].   Also download the MyChart app! Go to the app store, search "MyChart", open the app, select [place], and log in with your MyChart username and password.

## 2023-12-27 ENCOUNTER — Inpatient Hospital Stay: Attending: Hematology

## 2023-12-27 DIAGNOSIS — Z79899 Other long term (current) drug therapy: Secondary | ICD-10-CM | POA: Diagnosis not present

## 2023-12-27 DIAGNOSIS — E611 Iron deficiency: Secondary | ICD-10-CM | POA: Insufficient documentation

## 2023-12-27 DIAGNOSIS — E538 Deficiency of other specified B group vitamins: Secondary | ICD-10-CM | POA: Diagnosis not present

## 2023-12-27 LAB — COMPREHENSIVE METABOLIC PANEL WITH GFR
ALT: 69 U/L — ABNORMAL HIGH (ref 0–44)
AST: 35 U/L (ref 15–41)
Albumin: 4 g/dL (ref 3.5–5.0)
Alkaline Phosphatase: 104 U/L (ref 38–126)
Anion gap: 9 (ref 5–15)
BUN: 25 mg/dL — ABNORMAL HIGH (ref 6–20)
CO2: 25 mmol/L (ref 22–32)
Calcium: 10.4 mg/dL — ABNORMAL HIGH (ref 8.9–10.3)
Chloride: 103 mmol/L (ref 98–111)
Creatinine, Ser: 0.73 mg/dL (ref 0.44–1.00)
GFR, Estimated: >60 mL/min (ref >60)
Glucose, Bld: 149 mg/dL — ABNORMAL HIGH (ref 70–99)
Potassium: 3.6 mmol/L (ref 3.5–5.1)
Sodium: 137 mmol/L (ref 135–145)
Total Bilirubin: 0.3 mg/dL (ref 0.0–1.2)
Total Protein: 7.4 g/dL (ref 6.5–8.1)

## 2023-12-27 LAB — CBC WITH DIFFERENTIAL/PLATELET
Abs Immature Granulocytes: 0.05 10*3/uL (ref 0.00–0.07)
Basophils Absolute: 0.1 10*3/uL (ref 0.0–0.1)
Basophils Relative: 1 %
Eosinophils Absolute: 0.2 10*3/uL (ref 0.0–0.5)
Eosinophils Relative: 1 %
HCT: 44.4 % (ref 36.0–46.0)
Hemoglobin: 15.3 g/dL — ABNORMAL HIGH (ref 12.0–15.0)
Immature Granulocytes: 0 %
Lymphocytes Relative: 25 %
Lymphs Abs: 3 10*3/uL (ref 0.7–4.0)
MCH: 33.4 pg (ref 26.0–34.0)
MCHC: 34.5 g/dL (ref 30.0–36.0)
MCV: 96.9 fL (ref 80.0–100.0)
Monocytes Absolute: 1 10*3/uL (ref 0.1–1.0)
Monocytes Relative: 8 %
Neutro Abs: 7.7 10*3/uL (ref 1.7–7.7)
Neutrophils Relative %: 65 %
Platelets: 397 10*3/uL (ref 150–400)
RBC: 4.58 MIL/uL (ref 3.87–5.11)
RDW: 14.6 % (ref 11.5–15.5)
WBC: 12 10*3/uL — ABNORMAL HIGH (ref 4.0–10.5)
nRBC: 0 % (ref 0.0–0.2)

## 2023-12-27 LAB — IRON AND TIBC
Iron: 116 ug/dL (ref 28–170)
Saturation Ratios: 32 % — ABNORMAL HIGH (ref 10.4–31.8)
TIBC: 368 ug/dL (ref 250–450)
UIBC: 252 ug/dL

## 2023-12-27 LAB — FOLATE: Folate: 31.7 ng/mL (ref >5.9)

## 2023-12-27 LAB — VITAMIN B12: Vitamin B-12: 598 pg/mL (ref 180–914)

## 2023-12-27 LAB — FERRITIN: Ferritin: 139 ng/mL (ref 11–307)

## 2023-12-28 ENCOUNTER — Inpatient Hospital Stay

## 2024-01-04 ENCOUNTER — Inpatient Hospital Stay: Admitting: Oncology

## 2024-01-11 ENCOUNTER — Inpatient Hospital Stay: Admitting: Oncology

## 2024-01-11 VITALS — BP 118/87 | HR 84 | Temp 98.6°F | Resp 16 | Wt 170.2 lb

## 2024-01-11 DIAGNOSIS — E538 Deficiency of other specified B group vitamins: Secondary | ICD-10-CM

## 2024-01-11 DIAGNOSIS — D509 Iron deficiency anemia, unspecified: Secondary | ICD-10-CM

## 2024-01-11 DIAGNOSIS — E611 Iron deficiency: Secondary | ICD-10-CM

## 2024-01-11 NOTE — Assessment & Plan Note (Addendum)
 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 Last colonoscopy/endoscopy: 02/2023 with no evidence of active bleeding. Patient has significant improvement in iron levels and symptoms with IV iron  - Patient cannot tolerate oral iron with nausea and vomiting  Return to clinic in 6 months with labs.

## 2024-01-11 NOTE — Assessment & Plan Note (Addendum)
 Patient has mild vitamin B12 deficiency with levels less than 400  - Continue oral vitamin B12 1000 mcg daily

## 2024-01-11 NOTE — Progress Notes (Signed)
 [facility] at [facility]  HEMATOLOGY FOLLOW-UP VISIT  Lynwood Laneta ORN, PA-C  REASON FOR FOLLOW-UP: Iron deficiency anemia  ASSESSMENT & PLAN:  Patient is a 58 y.o. 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 Last colonoscopy/endoscopy: 02/2023 with no evidence of active bleeding. Patient has significant improvement in iron levels and symptoms with IV iron  - Patient cannot tolerate oral iron with nausea and vomiting  Return to clinic in 6 months with labs. Vitamin B12 deficiency Patient has mild vitamin B12 deficiency with levels less than 400  - Continue oral vitamin B12 1000 mcg daily     Orders Placed This Encounter  Procedures   Ferritin    Standing Status:   Future    Expected Date:   07/09/2024    Expiration Date:   10/07/2024   Folate    Standing Status:   Future    Expected Date:   07/09/2024    Expiration Date:   10/07/2024   Vitamin B12    Standing Status:   Future    Expected Date:   07/09/2024    Expiration Date:   10/07/2024   CBC with Differential/Platelet    Standing Status:   Future    Expected Date:   07/09/2024    Expiration Date:   10/07/2024   Comprehensive metabolic panel with GFR    Standing Status:   Future    Expected Date:   07/09/2024    Expiration Date:   10/07/2024   Iron and TIBC    Standing Status:   Future    Expected Date:   07/09/2024    Expiration Date:   10/07/2024    The total time spent in the appointment was 20 minutes encounter with patients including review of chart and various tests results, discussions about plan of care and coordination of care plan   All questions were answered. The patient knows to call the clinic with any problems, questions or concerns. No barriers to learning was detected.  Mickiel Dry, MD 7/17/20252:48 PM   SUMMARY OF HEMATOLOGIC HISTORY: Iron deficiency  without anemia likely secondary to malabsorption - IV Feraheme 500 mg X 2 doses on 520 02/14/2019 11/29/2023   INTERVAL HISTORY: Melody Johnson 58 y.o. female following for iron deficiency.  She reports some improvement in her symptoms and energy levels after IV iron but not significantly impactful.  She continues to take vitamin B12 pills.  No other complaints today and feels good overall.  I have reviewed the past medical history, past surgical history, social history and family history with the patient   ALLERGIES:  is allergic to mounjaro  [tirzepatide ], bacitracin-polymyxin b, codeine, neomycin -bacitracin zn-polymyx, penicillins, metformin , neomycin -polymyxin-gramicidin, roxicet [oxycodone -acetaminophen ], sulfonamide derivatives, penicillin g, and sulfamethoxazole.  MEDICATIONS:  Current Outpatient Medications  Medication Sig Dispense Refill   ALPRAZolam (XANAX) 0.5 MG tablet Take 0.5 mg by mouth 3 (three) times daily as needed for anxiety.     atorvastatin (LIPITOR) 20 MG tablet Take 20 mg by mouth daily.     conjugated estrogens  (PREMARIN ) vaginal cream Use 1 gm in vagina at bedtime for 2 weeks then 2-3 x weekly 30 g 3   cyanocobalamin (VITAMIN B12) 1000 MCG tablet Take 1 tablet (1,000 mcg total) by mouth daily. 90 tablet 2   estradiol  (ESTRACE ) 2 MG tablet Take 1 tablet (2 mg total) by mouth daily. 90 tablet 3  fluvoxaMINE (LUVOX) 50 MG tablet TAKE 1 TABLET BY MOUTH IN THE MORNING AND 2 TABLETS BY MOUTH AT BEDTIME Oral for 30 Days (Patient taking differently: 1 tablet in the morning and 1 tablet at night)     glipiZIDE (GLUCOTROL XL) 10 MG 24 hr tablet Oral for 90 Days     glucose blood (ONETOUCH VERIO) test strip Use as instructed to monitor glucose twice daily 100 each 12   losartan (COZAAR) 25 MG tablet Take 25 mg by mouth daily.     mirtazapine (REMERON) 15 MG tablet Oral for 90 Days     montelukast  (SINGULAIR ) 10 MG tablet Oral for 90 Days     Multiple Vitamin (MULTIVITAMIN)  tablet Take by mouth daily. Freedavite Multi Vit     omeprazole (PRILOSEC) 40 MG capsule TAKE 1 CAPSULE BY MOUTH DAILY 30 MINUTES BEFORE BREAKFAST ON AN EMPTY STOMACH Oral for 90 Days     Probiotic Product (ALIGN PO) Take by mouth.     progesterone  (PROMETRIUM ) 200 MG capsule Take 1 capsule (200 mg total) by mouth daily. 90 capsule 4   RESTASIS  MULTIDOSE 0.05 % ophthalmic emulsion   11   valACYclovir (VALTREX) 500 MG tablet Take 500 mg by mouth daily.     No current facility-administered medications for this visit.    REVIEW OF SYSTEMS:   Constitutional: Denies fevers, chills or night sweats Eyes: Denies blurriness of vision Ears, nose, mouth, throat, and face: Denies mucositis or sore throat Respiratory: Denies cough, dyspnea or wheezes Cardiovascular: Denies palpitation, chest discomfort or lower extremity swelling Gastrointestinal:  Denies nausea, heartburn or change in bowel habits Skin: Denies abnormal skin rashes Lymphatics: Denies new lymphadenopathy or easy bruising Neurological:Denies numbness, tingling or new weaknesses Behavioral/Psych: Mood is stable, no new changes  All other systems were reviewed with the patient and are negative.  PHYSICAL EXAMINATION:   Vitals:   01/11/24 1403  BP: 118/87  Pulse: 84  Resp: 16  Temp: 98.6 F (37 C)  SpO2: 99%    GENERAL:alert, no distress and comfortable SKIN: skin color, texture, turgor are normal, no rashes or significant lesions LUNGS: clear to auscultation and percussion with normal breathing effort HEART: regular rate & rhythm and no murmurs and no lower extremity edema ABDOMEN:abdomen soft, non-tender and normal bowel sounds Musculoskeletal:no cyanosis of digits and no clubbing  NEURO: alert & oriented x 3 with fluent speech  LABORATORY DATA:  I have reviewed the data as listed  Lab Results  Component Value Date   WBC 12.0 (H) 12/27/2023   NEUTROABS 7.7 12/27/2023   HGB 15.3 (H) 12/27/2023   HCT 44.4 12/27/2023    MCV 96.9 12/27/2023   PLT 397 12/27/2023      Chemistry      Component Value Date/Time   NA 137 12/27/2023 1329   NA 143 12/24/2014 1432   K 3.6 12/27/2023 1329   CL 103 12/27/2023 1329   CO2 25 12/27/2023 1329   BUN 25 (H) 12/27/2023 1329   BUN 24 (A) 06/02/2023 0000   CREATININE 0.73 12/27/2023 1329   CREATININE 0.69 08/15/2012 0835   GLU 130 06/02/2023 0000      Component Value Date/Time   CALCIUM 10.4 (H) 12/27/2023 1329   ALKPHOS 104 12/27/2023 1329   AST 35 12/27/2023 1329   ALT 69 (H) 12/27/2023 1329   BILITOT 0.3 12/27/2023 1329   BILITOT 0.3 12/24/2014 1432      Latest Reference Range & Units 12/27/23 13:28  Iron 28 -  170 ug/dL 883  UIBC ug/dL 747  TIBC 749 - 549 ug/dL 631  Saturation Ratios 10.4 - 31.8 % 32 (H)  Ferritin 11 - 307 ng/mL 139  Folate >5.9 ng/mL 31.7  Vitamin B12 180 - 914 pg/mL 598  (H): Data is abnormally high   RADIOGRAPHIC STUDIES: I have personally reviewed the radiological images as listed and agreed with the findings in the report.  None to review

## 2024-01-24 ENCOUNTER — Encounter: Payer: Self-pay | Admitting: Nurse Practitioner

## 2024-01-24 ENCOUNTER — Ambulatory Visit (INDEPENDENT_AMBULATORY_CARE_PROVIDER_SITE_OTHER): Admitting: Nurse Practitioner

## 2024-01-24 VITALS — BP 108/78 | HR 82 | Ht 62.6 in

## 2024-01-24 DIAGNOSIS — E119 Type 2 diabetes mellitus without complications: Secondary | ICD-10-CM | POA: Diagnosis not present

## 2024-01-24 DIAGNOSIS — I10 Essential (primary) hypertension: Secondary | ICD-10-CM

## 2024-01-24 DIAGNOSIS — E782 Mixed hyperlipidemia: Secondary | ICD-10-CM

## 2024-01-24 DIAGNOSIS — Z7984 Long term (current) use of oral hypoglycemic drugs: Secondary | ICD-10-CM | POA: Diagnosis not present

## 2024-01-24 LAB — POCT GLYCOSYLATED HEMOGLOBIN (HGB A1C): Hemoglobin A1C: 6.9 % — AB (ref 4.0–5.6)

## 2024-01-24 MED ORDER — GLIPIZIDE ER 10 MG PO TB24: 10.0000 mg | ORAL_TABLET | Freq: Every day | ORAL | 3 refills | Status: DC

## 2024-01-24 MED ORDER — ONETOUCH ULTRASOFT LANCETS MISC: 12 refills | Status: DC

## 2024-01-24 NOTE — Progress Notes (Signed)
 Endocrinology Follow Up Note       01/24/2024, 8:42 AM   Subjective:    Patient ID: Melody Johnson, female    DOB: 01-Nov-1965.  Melody Johnson is being seen in follow up after being seen in consultation for management of currently uncontrolled symptomatic diabetes requested by  James, Natalie W, PA-C.   Past Medical History:  Diagnosis Date   Anxiety    Arthritis    Depression    Diabetes mellitus without complication (HCC)    off meds after gastric bypass   GERD (gastroesophageal reflux disease)    High cholesterol    Hot flashes 12/24/2014   Hypertension    off meds after gastric bypass   Night sweats 12/24/2014   Obstructive sleep apnea    not using CPAP due to gastric bypass- not used for iver 2 yrs   PMB (postmenopausal bleeding) 12/25/2015   PONV (postoperative nausea and vomiting)    Postmenopausal HRT (hormone replacement therapy) 12/25/2015    Past Surgical History:  Procedure Laterality Date   BREAST BIOPSY Right 06/02/2015   FIBROCYSTIC CHANGE WITH APOCRINE METAPLASIA   CATARACT EXTRACTION W/PHACO Left 07/24/2014   Procedure: CATARACT EXTRACTION PHACO AND INTRAOCULAR LENS PLACEMENT LEFT;  Surgeon: Cherene Mania, MD;  Location: [facility];  Service: Ophthalmology;  Laterality: Left;  CDE:1.82   CATARACT EXTRACTION W/PHACO Right 08/04/2014   Procedure: CATARACT EXTRACTION PHACO AND INTRAOCULAR LENS PLACEMENT RIGHT EYE CDE=1.23;  Surgeon: Cherene Mania, MD;  Location: [facility];  Service: Ophthalmology;  Laterality: Right;   CERVICAL SPINE SURGERY  06/27/1998   CHOLECYSTECTOMY  06/27/2000   GASTRIC ROUX-EN-Y  05/01/2012   Procedure: LAPAROSCOPIC ROUX-EN-Y GASTRIC BYPASS WITH UPPER ENDOSCOPY;  Surgeon: Redell Faith, DO;  Location: [facility];  Service: General;  Laterality: N/A;   LASIK  06/27/1994   LITHOTRIPSY  05/28/2011    Social History   Socioeconomic History   Marital status: Married    Spouse name:  Not on file   Number of children: Not on file   Years of education: Not on file   Highest education level: Not on file  Occupational History   Not on file  Tobacco Use   Smoking status: Never   Smokeless tobacco: Never  Vaping Use   Vaping status: Never Used  Substance and Sexual Activity   Alcohol  use: Yes    Comment: occ   Drug use: No   Sexual activity: Yes    Birth control/protection: Post-menopausal  Other Topics Concern   Not on file  Social History Narrative   Not on file   Social Drivers of Health   Financial Resource Strain: Low Risk  (09/20/2023)   Overall Financial Resource Strain (CARDIA)    Difficulty of Paying Living Expenses: Not very hard  Food Insecurity: Low Risk  (10/25/2023)   Received from [facility]   Hunger Vital Sign    Within the past 12 months, you worried that your food would run out before you got money to buy more: Never true    Within the past 12 months, the food you bought just didn't last and you didn't have money to get more. : Never true  Transportation Needs: No Transportation  Needs (10/25/2023)   Received from [facility]    In the past 12 months, has lack of reliable transportation kept you from medical appointments, meetings, work or from getting things needed for daily living? : No  Physical Activity: Insufficiently Active (09/20/2023)   Exercise Vital Sign    Days of Exercise per Week: 2 days    Minutes of Exercise per Session: 40 min  Stress: No Stress Concern Present (09/20/2023)   [employer] of Occupational Health - Occupational Stress Questionnaire    Feeling of Stress : Only a little  Social Connections: Moderately Integrated (09/20/2023)   Social Connection and Isolation Panel    Frequency of Communication with Friends and Family: More than three times a week    Frequency of Social Gatherings with Friends and Family: Once a week    Attends Religious Services: 1 to 4 times per year    Active Member of  [employer] or Organizations: No    Attends Banker Meetings: Never    Marital Status: Married    Family History  Problem Relation Age of Onset   Cancer Sister        cervical   Cancer Maternal Grandmother        colon   Crohn's disease Mother    Leukemia Mother    Hyperlipidemia Mother    Thyroid disease Mother    Stroke Mother    Diabetes Father    Hypertension Father    Hyperlipidemia Father    Diabetes Sister     Outpatient Encounter Medications as of 01/24/2024  Medication Sig   ALPRAZolam (XANAX) 0.5 MG tablet Take 0.5 mg by mouth 3 (three) times daily as needed for anxiety.   atorvastatin (LIPITOR) 20 MG tablet Take 20 mg by mouth daily.   conjugated estrogens  (PREMARIN ) vaginal cream Use 1 gm in vagina at bedtime for 2 weeks then 2-3 x weekly   cyanocobalamin (VITAMIN B12) 1000 MCG tablet Take 1 tablet (1,000 mcg total) by mouth daily.   estradiol  (ESTRACE ) 2 MG tablet Take 1 tablet (2 mg total) by mouth daily.   fluvoxaMINE (LUVOX) 50 MG tablet TAKE 1 TABLET BY MOUTH IN THE MORNING AND 2 TABLETS BY MOUTH AT BEDTIME Oral for 30 Days (Patient taking differently: 1 tablet in the morning and 1 tablet at night)   glucose blood (ONETOUCH VERIO) test strip Use as instructed to monitor glucose twice daily   Lancets (ONETOUCH ULTRASOFT) lancets Use as instructed to monitor glucose twice daily   losartan (COZAAR) 25 MG tablet Take 25 mg by mouth daily.   mirtazapine (REMERON) 15 MG tablet Oral for 90 Days   montelukast  (SINGULAIR ) 10 MG tablet Oral for 90 Days   Multiple Vitamin (MULTIVITAMIN) tablet Take by mouth daily. Freedavite Multi Vit   omeprazole (PRILOSEC) 40 MG capsule TAKE 1 CAPSULE BY MOUTH DAILY 30 MINUTES BEFORE BREAKFAST ON AN EMPTY STOMACH Oral for 90 Days   Probiotic Product (ALIGN PO) Take by mouth.   progesterone  (PROMETRIUM ) 200 MG capsule Take 1 capsule (200 mg total) by mouth daily.   RESTASIS  MULTIDOSE 0.05 % ophthalmic emulsion    valACYclovir  (VALTREX) 500 MG tablet Take 500 mg by mouth daily.   [DISCONTINUED] glipiZIDE  (GLUCOTROL  XL) 10 MG 24 hr tablet Oral for 90 Days   glipiZIDE  (GLUCOTROL  XL) 10 MG 24 hr tablet Take 1 tablet (10 mg total) by mouth daily with breakfast.   No facility-administered encounter medications on file as of 01/24/2024.  ALLERGIES: Allergies  Allergen Reactions   Mounjaro  [Tirzepatide ] Nausea And Vomiting    Patient also had gas, lightheadedness.   Bacitracin-Polymyxin B Other (See Comments) and Dermatitis    Cause wound to become infected  Will actually cause infection instead of healing the area it has been applied to.   Codeine Nausea Only and Nausea And Vomiting   Neomycin -Bacitracin Zn-Polymyx Other (See Comments)    Will actually cause infection instead of healing the area it has been applied to.   Penicillins Rash    Starts at feet and works its way up the body.    Metformin  Nausea And Vomiting    With dizziness   Neomycin -Polymyxin-Gramicidin Other (See Comments)    Cause wound to become infected   Roxicet [Oxycodone -Acetaminophen ] Itching   Sulfonamide Derivatives     REACTION: UNKNOWN REACTION   Penicillin G Rash   Sulfamethoxazole Rash    VACCINATION STATUS:  There is no immunization history on file for this patient.  Diabetes She presents for her follow-up diabetic visit. She has type 2 diabetes mellitus. Onset time: diagnosed initially prior to gastric bypass in 2010, went into remission for a time, now it is back. Her disease course has been improving. There are no hypoglycemic associated symptoms. There are no hypoglycemic complications. (gastroparesis) Risk factors for coronary artery disease include diabetes mellitus, family history, hypertension and obesity. Current diabetic treatment includes oral agent (monotherapy). She is compliant with treatment most of the time. Her weight is fluctuating minimally. She is following a generally healthy diet. When asked about meal  planning, she reported none. She has not had a previous visit with a dietitian. She participates in exercise intermittently. Her home blood glucose trend is decreasing steadily. Her breakfast blood glucose range is generally 140-180 mg/dl. Her overall blood glucose range is 140-180 mg/dl. (She presents today with her logs showing above target glycemic profile overall.  Her POCT A1c today is 6.9%, improving from last visit of 7.5%.  She did not tolerate the starter dose of Mounjaro  due to severe GI side effects, nor Metformin , even in the ER form.  She has been to rheumatology since last visit for suspicion of Sjogren's, and also had 2 iron infusions.) An ACE inhibitor/angiotensin II receptor blocker is being taken. She does not see a podiatrist.Eye exam is current.     Review of systems  Constitutional: + stable body weight, current Body mass index is 30.54 kg/m., no fatigue, no subjective hyperthermia, no subjective hypothermia Eyes: no blurry vision, no xerophthalmia ENT: no sore throat, no nodules palpated in throat, no dysphagia/odynophagia, no hoarseness Cardiovascular: no chest pain, no shortness of breath, no palpitations, no leg swelling Respiratory: no cough, no shortness of breath Gastrointestinal: no nausea/vomiting/diarrhea Musculoskeletal: no muscle/joint aches Skin: no rashes, no hyperemia Neurological: no tremors, no numbness, no tingling, no dizziness Psychiatric: no depression, no anxiety  Objective:     BP 108/78 (BP Location: Right Arm, Patient Position: Sitting, Cuff Size: Large)   Pulse 82   Ht 5' 2.6 (1.59 m)   LMP 10/18/2015   BMI 30.54 kg/m   Wt Readings from Last 3 Encounters:  01/11/24 170 lb 3.2 oz (77.2 kg)  11/14/23 167 lb 15.9 oz (76.2 kg)  09/25/23 170 lb (77.1 kg)     BP Readings from Last 3 Encounters:  01/24/24 108/78  01/11/24 118/87  11/29/23 125/82      Physical Exam- Limited  Constitutional:  Body mass index is 30.54 kg/m. , not in  acute  distress, normal state of mind Eyes:  EOMI, no exophthalmos Musculoskeletal: no gross deformities, strength intact in all four extremities, no gross restriction of joint movements Skin:  no rashes, no hyperemia Neurological: no tremor with outstretched hands   Diabetic Foot Exam - Simple   No data filed      CMP ( most recent) CMP     Component Value Date/Time   NA 137 12/27/2023 1329   NA 143 12/24/2014 1432   K 3.6 12/27/2023 1329   CL 103 12/27/2023 1329   CO2 25 12/27/2023 1329   GLUCOSE 149 (H) 12/27/2023 1329   BUN 25 (H) 12/27/2023 1329   BUN 24 (A) 06/02/2023 0000   CREATININE 0.73 12/27/2023 1329   CREATININE 0.69 08/15/2012 0835   CALCIUM 10.4 (H) 12/27/2023 1329   PROT 7.4 12/27/2023 1329   PROT 7.1 12/24/2014 1432   ALBUMIN 4.0 12/27/2023 1329   ALBUMIN 4.5 12/24/2014 1432   AST 35 12/27/2023 1329   ALT 69 (H) 12/27/2023 1329   ALKPHOS 104 12/27/2023 1329   BILITOT 0.3 12/27/2023 1329   BILITOT 0.3 12/24/2014 1432   EGFR >90 06/02/2023 0000   GFRNONAA >60 12/27/2023 1329     Diabetic Labs (most recent): Lab Results  Component Value Date   HGBA1C 6.9 (A) 01/24/2024   HGBA1C 7.5 (A) 09/25/2023   HGBA1C 7.0 (A) 06/27/2023   MICROALBUR 80mg /L 06/27/2023     Lipid Panel ( most recent) Lipid Panel     Component Value Date/Time   CHOL 138 08/15/2012 0835   TRIG 110 06/02/2023 0000   HDL 39 (L) 08/15/2012 0835   CHOLHDL 3.5 08/15/2012 0835   VLDL 17 08/15/2012 0835   LDLCALC 93 06/02/2023 0000      Lab Results  Component Value Date   TSH 2.00 06/02/2023   TSH 0.906 12/24/2014   TSH 1.447 08/04/2011           Assessment & Plan:   1) Type 2 diabetes mellitus without complication, without long-term current use of insulin  (HCC)  She presents today with her logs showing above target glycemic profile overall.  Her POCT A1c today is 6.9%, improving from last visit of 7.5%.  She did not tolerate the starter dose of Mounjaro  due to  severe GI side effects, nor Metformin , even in the ER form.  She has been to rheumatology since last visit for suspicion of Sjogren's, and also had 2 iron infusions.  - Rosslyn Wisnewski has currently uncontrolled symptomatic type 2 DM.   -Recent labs reviewed.  - I had a long discussion with her about the progressive nature of diabetes and the pathology behind its complications. -her diabetes is complicated by gastroparesis and she remains at a high risk for more acute and chronic complications which include CAD, CVA, CKD, retinopathy, and neuropathy. These are all discussed in detail with her.  The following Lifestyle Medicine recommendations according to American College of Lifestyle Medicine [facility]) were discussed and offered to patient and she agrees to start the journey:  A. Whole Foods, Plant-based plate comprising of fruits and vegetables, plant-based proteins, whole-grain carbohydrates was discussed in detail with the patient.   A list for source of those nutrients were also provided to the patient.  Patient will use only water or unsweetened tea for hydration. B.  The need to stay away from risky substances including alcohol , smoking; obtaining 7 to 9 hours of restorative sleep, at least 150 minutes of moderate intensity exercise weekly, the importance of  healthy social connections,  and stress reduction techniques were discussed. C.  A full color page of  Calorie density of various food groups per pound showing examples of each food groups was provided to the patient.  - Nutritional counseling repeated at each appointment due to patients tendency to fall back in to old habits.  - The patient admits there is a room for improvement in their diet and drink choices. -  Suggestion is made for the patient to avoid simple carbohydrates from their diet including Cakes, Sweet Desserts / Pastries, Ice Cream, Soda (diet and regular), Sweet Tea, Candies, Chips, Cookies, Sweet Pastries, Store Bought  Juices, Alcohol  in Excess of 1-2 drinks a day, Artificial Sweeteners, Coffee Creamer, and Sugar-free Products. This will help patient to have stable blood glucose profile and potentially avoid unintended weight gain.   - I encouraged the patient to switch to unprocessed or minimally processed complex starch and increased protein intake (animal or plant source), fruits, and vegetables.   - Patient is advised to stick to a routine mealtimes to eat 3 meals a day and avoid unnecessary snacks (to snack only to correct hypoglycemia).  - I have approached her with the following individualized plan to manage her diabetes and patient agrees:   - she is advised to continue Glipizide  10 mg XL daily at breakfast.    -she is encouraged to continue monitoring glucose 2 times daily, before breakfast and before bed, and to call the clinic if she has readings less than 70 or above 300 for 3 tests in a row.  - Adjustment parameters are given to her for hypo and hyperglycemia in writing.  - she is not a candidate for Metformin  due to GI side effects.  -She has been prescribed both Trulicity (did better on this one vs Ozempic) and Ozempic in the past but could not afford the copay.  She did try the Mounjaro  most recently but could not tolerate it due to GI SE as well.  - Specific targets for  A1c; LDL, HDL, and Triglycerides were discussed with the patient.  2) Blood Pressure /Hypertension:  her blood pressure is controlled to target.   she is advised to continue her current medications including Losartan 25 mg p.o. daily with breakfast.  3) Lipids/Hyperlipidemia:    Review of her recent lipid panel from 06/02/23 showed controlled LDL at 93 .  she is advised to continue Lipitor 20 mg daily at bedtime.  Side effects and precautions discussed with her.    4)  Weight/Diet:  her Body mass index is 30.54 kg/m.  -  clearly complicating her diabetes care.   she is a candidate for weight loss. She did have gastric  bypass surgery in the past but could stand to lose a bit more weight.  I discussed with her the fact that loss of 5 - 10% of her  current body weight will have the most impact on her diabetes management.  Exercise, and detailed carbohydrates information provided  -  detailed on discharge instructions.  5) Chronic Care/Health Maintenance: -she is on ACEI/ARB and Statin medications and is encouraged to initiate and continue to follow up with Ophthalmology, Dentist, Podiatrist at least yearly or according to recommendations, and advised to stay away from smoking. I have recommended yearly flu vaccine and pneumonia vaccine at least every 5 years; moderate intensity exercise for up to 150 minutes weekly; and sleep for at least 7 hours a day.  - she is advised to maintain close  follow up with James, Natalie W, PA-C for primary care needs, as well as her other providers for optimal and coordinated care.     I spent  22  minutes in the care of the patient today including review of labs from CMP, Lipids, Thyroid Function, Hematology (current and previous including abstractions from other facilities); face-to-face time discussing  her blood glucose readings/logs, discussing hypoglycemia and hyperglycemia episodes and symptoms, medications doses, her options of short and long term treatment based on the latest standards of care / guidelines;  discussion about incorporating lifestyle medicine;  and documenting the encounter. Risk reduction counseling performed per USPSTF guidelines to reduce obesity and cardiovascular risk factors.     Please refer to Patient Instructions for Blood Glucose Monitoring and Insulin /Medications Dosing Guide  in media tab for additional information. Please  also refer to  Patient Self Inventory in the Media  tab for reviewed elements of pertinent patient history.  Zhoe Youngman participated in the discussions, expressed understanding, and voiced agreement with the above plans.   All questions were answered to her satisfaction. she is encouraged to contact clinic should she have any questions or concerns prior to her return visit.     Follow up plan: - Return in about 4 months (around 05/26/2024) for Diabetes F/U with A1c in office, No previsit labs.   Benton Rio, [facility] [address] [place], [place] [address] Phone: [phone] Fax: [phone]  01/24/2024, 8:42 AM

## 2024-01-25 ENCOUNTER — Encounter (INDEPENDENT_AMBULATORY_CARE_PROVIDER_SITE_OTHER): Payer: Self-pay

## 2024-02-21 ENCOUNTER — Encounter (INDEPENDENT_AMBULATORY_CARE_PROVIDER_SITE_OTHER): Payer: Self-pay | Admitting: Otolaryngology

## 2024-02-21 ENCOUNTER — Ambulatory Visit (INDEPENDENT_AMBULATORY_CARE_PROVIDER_SITE_OTHER): Admitting: Otolaryngology

## 2024-02-21 ENCOUNTER — Other Ambulatory Visit (INDEPENDENT_AMBULATORY_CARE_PROVIDER_SITE_OTHER): Payer: Self-pay | Admitting: Otolaryngology

## 2024-02-21 ENCOUNTER — Encounter: Payer: Self-pay | Admitting: Nurse Practitioner

## 2024-02-21 VITALS — BP 119/81 | HR 78

## 2024-02-21 DIAGNOSIS — K117 Disturbances of salivary secretion: Secondary | ICD-10-CM | POA: Diagnosis not present

## 2024-02-21 DIAGNOSIS — R768 Other specified abnormal immunological findings in serum: Secondary | ICD-10-CM | POA: Diagnosis not present

## 2024-02-21 MED ORDER — CHLORHEXIDINE GLUCONATE 0.12 % MT SOLN: 15.0000 mL | Freq: Two times a day (BID) | OROMUCOSAL | 0 refills | Status: AC

## 2024-02-21 NOTE — Progress Notes (Signed)
 ENT CONSULT:  Reason for Consult: positive ANA dry eyes and dry mouth   HPI: Discussed the use of AI scribe software for clinical note transcription with the patient, who gave verbal consent to proceed.  History of Present Illness Melody Johnson is a 58 year old female with positive ANA labs, dry eyes, and dry mouth who presents for evaluation of her symptoms. She was referred by her rheumatologist for a biopsy of minor salivary glands  She has experienced symptoms of dry eyes and dry mouth for the last couple of years, with significant worsening over the past two years. These symptoms have persisted despite discontinuing medications that could potentially cause dry mouth.  She has a history of positive ANA labs, which led to her referral to rheumatology. The rheumatologist has recommended a biopsy to confirm the diagnosis.  Additionally, she has a history of anemia, for which she is receiving B12 supplementation and has undergone two iron infusions.    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   GERD (gastroesophageal reflux disease)    High cholesterol    Hot flashes 12/24/2014   Hypertension    off meds after gastric bypass   Night sweats 12/24/2014   Obstructive sleep apnea    not using CPAP due to gastric bypass- not used for iver 2 yrs   PMB (postmenopausal bleeding) 12/25/2015   PONV (postoperative nausea and vomiting)    Postmenopausal HRT (hormone replacement therapy) 12/25/2015    Past Surgical History:  Procedure Laterality Date   BREAST BIOPSY Right 06/02/2015   FIBROCYSTIC CHANGE WITH APOCRINE METAPLASIA   CATARACT EXTRACTION W/PHACO Left 07/24/2014   Procedure: CATARACT EXTRACTION PHACO AND INTRAOCULAR LENS  PLACEMENT LEFT;  Surgeon: Cherene Mania, MD;  Location: [facility];  Service: Ophthalmology;  Laterality: Left;  CDE:1.82   CATARACT EXTRACTION W/PHACO Right 08/04/2014   Procedure: CATARACT EXTRACTION PHACO AND INTRAOCULAR LENS PLACEMENT RIGHT EYE CDE=1.23;  Surgeon: Cherene Mania, MD;  Location: [facility];  Service: Ophthalmology;  Laterality: Right;   CERVICAL SPINE SURGERY  06/27/1998   CHOLECYSTECTOMY  06/27/2000   GASTRIC ROUX-EN-Y  05/01/2012   Procedure: LAPAROSCOPIC ROUX-EN-Y GASTRIC BYPASS WITH UPPER ENDOSCOPY;  Surgeon: Redell Faith, DO;  Location: [facility];  Service: General;  Laterality: N/A;   LASIK  06/27/1994   LITHOTRIPSY  05/28/2011    Family History  Problem Relation Age of Onset   Cancer Sister        cervical   Cancer Maternal Grandmother        colon   Crohn's disease Mother    Leukemia Mother    Hyperlipidemia Mother    Thyroid disease Mother    Stroke Mother    Diabetes Father    Hypertension Father    Hyperlipidemia Father    Diabetes Sister     Social History:  reports that she has never smoked. She has never used smokeless tobacco. She reports current alcohol  use. She reports that she does not use drugs.  Allergies:  Allergies  Allergen Reactions   Mounjaro  [Tirzepatide ] Nausea And Vomiting    Patient also had gas, lightheadedness.   Bacitracin-Polymyxin B Other (See Comments) and Dermatitis    Cause wound  to become infected  Will actually cause infection instead of healing the area it has been applied to.   Codeine Nausea Only and Nausea And Vomiting   Neomycin -Bacitracin Zn-Polymyx Other (See Comments)    Will actually cause infection instead of healing the area it has been applied to.   Penicillins Rash    Starts at feet and works its way up the body.    Metformin  Nausea And Vomiting    With dizziness   Neomycin -Polymyxin-Gramicidin Other (See Comments)    Cause wound to become infected   Roxicet [Oxycodone -Acetaminophen ] Itching   Sulfonamide  Derivatives     REACTION: UNKNOWN REACTION   Penicillin G Rash   Sulfamethoxazole Rash    Medications: I have reviewed the patient's current medications.  The PMH, PSH, Medications, Allergies, and SH were reviewed and updated.  ROS: Constitutional: Negative for fever, weight loss and weight gain. Cardiovascular: Negative for chest pain and dyspnea on exertion. Respiratory: Is not experiencing shortness of breath at rest. Gastrointestinal: Negative for nausea and vomiting. Neurological: Negative for headaches. Psychiatric: The patient is not nervous/anxious  Blood pressure 119/81, pulse 78, last menstrual period 10/18/2015, SpO2 97%.  PHYSICAL EXAM:  Exam: General: Well-developed, well-nourished Communication and Voice: Clear pitch and clarity Respiratory Respiratory effort: Equal inspiration and expiration without stridor Cardiovascular Peripheral Vascular: Warm extremities with equal color/perfusion Eyes: No nystagmus with equal extraocular motion bilaterally Neuro/Psych/Balance: Patient oriented to person, place, and time; Appropriate mood and affect; Gait is intact with no imbalance; Cranial nerves I-XII are intact Head and Face Inspection: Normocephalic and atraumatic without mass or lesion Palpation: Facial skeleton intact without bony stepoffs Salivary Glands: No mass or tenderness Facial Strength: Facial motility symmetric and full bilaterally ENT Pinna: External ear intact and fully developed External canal: Canal is patent with intact skin Tympanic Membrane: Clear and mobile External Nose: No scar or anatomic deformity Internal Nose: Septum intact and midline. No edema, polyp, or rhinorrhea Lips, Teeth, and gums: Mucosa and teeth intact and viable, mucous membranes slightly dry TMJ: No pain to palpation with full mobility Oral cavity/oropharynx: No erythema or exudate, no lesions present Neck Neck and Trachea: Midline trachea without mass or lesion Thyroid: No  mass or nodularity Lymphatics: No lymphadenopathy  Procedure: Procedure: minor salivary gland biopsy  ATTENDING: Elena Larry, M.D.   PREOPERATIVE DIAGNOSIS(ES): xerostomia, positive ANA  POSTOPERATIVE DIAGNOSIS(ES): Same  PROCEDURE PERFORMED: biopsy of minor salivary glands  INDICATIONS FOR PROCEDURE: oral tongue lesion  The risks and benefits of the surgical procedure have been explained in detail to the patient and they have elected to proceed.  CONSENT:  Informed consent was obtained prior to the procedure after discussion of risks, benefits, and alternatives and expected outcomes were discussed with the patient; consent placed in chart. The possibilities of reaction to medication, bleeding, infection, inadequate biopsy, the need for additional procedures, failure to diagnose a condition, and creating a complication requiring transfusion or operation were discussed with the patient. The patient concurred with the proposed plan, giving informed consent.    UNIVERSAL PROTOCOL/ TIMEOUT: Preprocedure verification is complete- patient verified and consents confirmed.  H&P REVIEW: The patient's history and physical were reviewed today prior to procedure. All medications were reviewed and updated as well.  ANESTHESIA: local anesthesia with 1% lidocaine , 1:100,000 epinephrine   PROCEDURE DETAILS: The patient was brought to the clinic and placed in a seated position.  The mucosa of the vestibular lower lip on the left was anesthetized.  A 0.5 cm incision was made  through mucosa until several minor salivary glands were visualized, and removed to be sent for for permanent pathology and Sjogren testing. Hemostasis was obtained using Afrin soaked pledgets. The edges of the mucosal opening were approximated with 5-0 chromic gut suture. There were no complications and the patient tolerated the procedure well.  ESTIMATED BLOOD LOSS: Minimal  SPECIMEN(S) REMOVED: left lower lip minor salivary  gland biopsy  FINDINGS:  No evidence of significant bleeding or other complication  CONDITION: Stable  COMPLICATIONS:The patient tolerated the procedure well without apparent complications and was ambulatory.  NOTES: given rx for Peridex  mouthwash     Assessment/Plan: Encounter Diagnoses  Name Primary?   Xerostomia Yes   Positive ANA (antinuclear antibody) [R76.8]     Assessment and Plan Assessment & Plan Chronic xerostomia and dry eyes Minor salivary gland biopsy requested for evaluation of sicca symptoms (dry eyes and dry mouth). Already tried ruling out other causes without success. Positive ANA labs with worsening dry eyes and dry mouth over two years. Rheumatology recommended biopsy to investigate sicca symptoms. - Performed minor salivary gland biopsy today - f/u on results - Prescribed antibiotic mouthwash chlorhexidine  for five days post-procedure, instructed to rinse after eating. - we will call with results and send results to her Rheumatologist Dr Alverda     Thank you for allowing me to participate in the care of this patient. Please do not hesitate to contact me with any questions or concerns.   Elena Larry, MD Otolaryngology [facility] Phone: [phone] Fax: [phone]    02/21/2024, 9:06 AM

## 2024-02-22 LAB — DERMATOLOGY PATHOLOGY

## 2024-02-28 ENCOUNTER — Telehealth (INDEPENDENT_AMBULATORY_CARE_PROVIDER_SITE_OTHER): Payer: Self-pay

## 2024-02-28 ENCOUNTER — Ambulatory Visit (INDEPENDENT_AMBULATORY_CARE_PROVIDER_SITE_OTHER): Payer: Self-pay

## 2024-02-28 NOTE — Telephone Encounter (Signed)
 LVM for patient to give [place] a call back.

## 2024-02-29 ENCOUNTER — Telehealth (INDEPENDENT_AMBULATORY_CARE_PROVIDER_SITE_OTHER): Payer: Self-pay | Admitting: Otolaryngology

## 2024-02-29 ENCOUNTER — Telehealth (INDEPENDENT_AMBULATORY_CARE_PROVIDER_SITE_OTHER): Payer: Self-pay

## 2024-02-29 MED ORDER — ONETOUCH ULTRASOFT LANCETS MISC: 12 refills | Status: DC

## 2024-02-29 NOTE — Telephone Encounter (Signed)
Spoke with patient regarding results. Patient understood.

## 2024-02-29 NOTE — Telephone Encounter (Signed)
 Did you ever talk to the patient about Nidas proposal to start another medication to help control her sugars?  If not, I can send her a message, just wanted to double check with you first.

## 2024-02-29 NOTE — Telephone Encounter (Signed)
 Patient returned your call and LVM requesting a call back regarding her biopsy results.  She can be reached at [phone].

## 2024-03-04 NOTE — Telephone Encounter (Signed)
 No, Melody Johnson I have not.

## 2024-03-05 MED ORDER — DEXCOM G7 SENSOR MISC: 1.0000 | 3 refills | Status: DC

## 2024-03-05 NOTE — Addendum Note (Signed)
 Addended by: Ezrie Bunyan J on: 03/05/2024 03:03 PM   Modules accepted: Orders

## 2024-03-11 ENCOUNTER — Telehealth: Payer: Self-pay

## 2024-03-11 NOTE — Telephone Encounter (Signed)
 Pharmacy Patient Advocate Encounter   Received notification from Patient Advice Request messages that prior authorization for Dexcom G7 sensor is required/requested.   Insurance verification completed.   The patient is insured through [facility] .   Per test claim: PA required and submitted KEY/EOC/Request #: [id number] due to Pt is not currently using insulin  to manage their diabetes. Insurance will not cover.

## 2024-03-11 NOTE — Telephone Encounter (Signed)
 Patient needs PA for Dexcom G7 sensors please.  Thank you.

## 2024-05-28 ENCOUNTER — Ambulatory Visit: Admitting: Nurse Practitioner

## 2024-05-28 ENCOUNTER — Encounter: Payer: Self-pay | Admitting: Nurse Practitioner

## 2024-05-28 VITALS — BP 120/70 | HR 97 | Ht 63.0 in | Wt 176.4 lb

## 2024-05-28 DIAGNOSIS — Z7984 Long term (current) use of oral hypoglycemic drugs: Secondary | ICD-10-CM | POA: Diagnosis not present

## 2024-05-28 DIAGNOSIS — E119 Type 2 diabetes mellitus without complications: Secondary | ICD-10-CM

## 2024-05-28 DIAGNOSIS — E782 Mixed hyperlipidemia: Secondary | ICD-10-CM

## 2024-05-28 DIAGNOSIS — I10 Essential (primary) hypertension: Secondary | ICD-10-CM

## 2024-05-28 LAB — POCT GLYCOSYLATED HEMOGLOBIN (HGB A1C): Hemoglobin A1C: 7.9 % — AB (ref 4.0–5.6)

## 2024-05-28 LAB — POCT UA - MICROALBUMIN: Creatinine, POC: 300 mg/dL

## 2024-05-28 MED ORDER — GLIPIZIDE ER 10 MG PO TB24: 10.0000 mg | ORAL_TABLET | Freq: Every day | ORAL | 3 refills | Status: AC

## 2024-05-28 MED ORDER — ONETOUCH ULTRASOFT LANCETS MISC: 12 refills | Status: AC

## 2024-05-28 MED ORDER — SITAGLIPTIN PHOSPHATE 50 MG PO TABS: 50.0000 mg | ORAL_TABLET | Freq: Every day | ORAL | 1 refills | Status: AC

## 2024-05-28 MED ORDER — ONETOUCH VERIO VI STRP: ORAL_STRIP | 12 refills | Status: DC

## 2024-05-28 NOTE — Progress Notes (Signed)
 Endocrinology Follow Up Note       05/28/2024, 8:50 AM   Subjective:    Patient ID: Melody Johnson, female    DOB: 1965-08-01.  Melody Johnson is being seen in follow up after being seen in consultation for management of currently uncontrolled symptomatic diabetes requested by  James, Natalie W, PA-C.   Past Medical History:  Diagnosis Date   Anxiety    Arthritis    Depression    Diabetes mellitus without complication (HCC)    off meds after gastric bypass   GERD (gastroesophageal reflux disease)    High cholesterol    Hot flashes 12/24/2014   Hypertension    off meds after gastric bypass   Night sweats 12/24/2014   Obstructive sleep apnea    not using CPAP due to gastric bypass- not used for iver 2 yrs   PMB (postmenopausal bleeding) 12/25/2015   PONV (postoperative nausea and vomiting)    Postmenopausal HRT (hormone replacement therapy) 12/25/2015    Past Surgical History:  Procedure Laterality Date   BREAST BIOPSY Right 06/02/2015   FIBROCYSTIC CHANGE WITH APOCRINE METAPLASIA   CATARACT EXTRACTION W/PHACO Left 07/24/2014   Procedure: CATARACT EXTRACTION PHACO AND INTRAOCULAR LENS PLACEMENT LEFT;  Surgeon: Cherene Mania, MD;  Location: [facility];  Service: Ophthalmology;  Laterality: Left;  CDE:1.82   CATARACT EXTRACTION W/PHACO Right 08/04/2014   Procedure: CATARACT EXTRACTION PHACO AND INTRAOCULAR LENS PLACEMENT RIGHT EYE CDE=1.23;  Surgeon: Cherene Mania, MD;  Location: [facility];  Service: Ophthalmology;  Laterality: Right;   CERVICAL SPINE SURGERY  06/27/1998   CHOLECYSTECTOMY  06/27/2000   GASTRIC ROUX-EN-Y  05/01/2012   Procedure: LAPAROSCOPIC ROUX-EN-Y GASTRIC BYPASS WITH UPPER ENDOSCOPY;  Surgeon: Redell Faith, DO;  Location: [facility];  Service: General;  Laterality: N/A;   LASIK  06/27/1994   LITHOTRIPSY  05/28/2011    Social History   Socioeconomic History   Marital status: Married    Spouse name:  Not on file   Number of children: Not on file   Years of education: Not on file   Highest education level: Not on file  Occupational History   Not on file  Tobacco Use   Smoking status: Never   Smokeless tobacco: Never  Vaping Use   Vaping status: Never Used  Substance and Sexual Activity   Alcohol  use: Yes    Comment: occ   Drug use: No   Sexual activity: Yes    Birth control/protection: Post-menopausal  Other Topics Concern   Not on file  Social History Narrative   Not on file   Social Drivers of Health   Financial Resource Strain: Low Risk  (09/20/2023)   Overall Financial Resource Strain (CARDIA)    Difficulty of Paying Living Expenses: Not very hard  Food Insecurity: Low Risk  (03/12/2024)   Received from [facility]   Hunger Vital Sign    Within the past 12 months, you worried that your food would run out before you got money to buy more: Never true    Within the past 12 months, the food you bought just didn't last and you didn't have money to get more. : Never true  Transportation Needs: No Transportation  Needs (03/12/2024)   Received from [facility]    In the past 12 months, has lack of reliable transportation kept you from medical appointments, meetings, work or from getting things needed for daily living? : No  Physical Activity: Insufficiently Active (09/20/2023)   Exercise Vital Sign    Days of Exercise per Week: 2 days    Minutes of Exercise per Session: 40 min  Stress: No Stress Concern Present (09/20/2023)   [employer] of Occupational Health - Occupational Stress Questionnaire    Feeling of Stress : Only a little  Social Connections: Moderately Integrated (09/20/2023)   Social Connection and Isolation Panel    Frequency of Communication with Friends and Family: More than three times a week    Frequency of Social Gatherings with Friends and Family: Once a week    Attends Religious Services: 1 to 4 times per year    Active Member of  [employer] or Organizations: No    Attends Banker Meetings: Never    Marital Status: Married    Family History  Problem Relation Age of Onset   Cancer Sister        cervical   Cancer Maternal Grandmother        colon   Crohn's disease Mother    Leukemia Mother    Hyperlipidemia Mother    Thyroid disease Mother    Stroke Mother    Diabetes Father    Hypertension Father    Hyperlipidemia Father    Diabetes Sister     Outpatient Encounter Medications as of 05/28/2024  Medication Sig   ALPRAZolam (XANAX) 0.5 MG tablet Take 0.5 mg by mouth 3 (three) times daily as needed for anxiety.   atorvastatin (LIPITOR) 20 MG tablet Take 20 mg by mouth daily.   conjugated estrogens  (PREMARIN ) vaginal cream Use 1 gm in vagina at bedtime for 2 weeks then 2-3 x weekly   cyanocobalamin  (VITAMIN B12) 1000 MCG tablet Take 1 tablet (1,000 mcg total) by mouth daily.   estradiol  (ESTRACE ) 2 MG tablet Take 1 tablet (2 mg total) by mouth daily.   fluvoxaMINE (LUVOX) 50 MG tablet TAKE 1 TABLET BY MOUTH IN THE MORNING AND 2 TABLETS BY MOUTH AT BEDTIME Oral for 30 Days (Patient taking differently: 1 tablet in the morning and 1 tablet at night)   losartan (COZAAR) 25 MG tablet Take 25 mg by mouth daily.   mirtazapine (REMERON) 15 MG tablet Oral for 90 Days   montelukast  (SINGULAIR ) 10 MG tablet Oral for 90 Days   Multiple Vitamin (MULTIVITAMIN) tablet Take by mouth daily. Freedavite Multi Vit   omeprazole (PRILOSEC) 40 MG capsule TAKE 1 CAPSULE BY MOUTH DAILY 30 MINUTES BEFORE BREAKFAST ON AN EMPTY STOMACH Oral for 90 Days   Probiotic Product (ALIGN PO) Take by mouth.   progesterone  (PROMETRIUM ) 200 MG capsule Take 1 capsule (200 mg total) by mouth daily.   RESTASIS  MULTIDOSE 0.05 % ophthalmic emulsion    sitaGLIPtin  (JANUVIA ) 50 MG tablet Take 1 tablet (50 mg total) by mouth daily.   valACYclovir (VALTREX) 500 MG tablet Take 500 mg by mouth daily.   [DISCONTINUED] glipiZIDE  (GLUCOTROL  XL) 10 MG 24  hr tablet Take 1 tablet (10 mg total) by mouth daily with breakfast.   [DISCONTINUED] glucose blood (ONETOUCH VERIO) test strip Use as instructed to monitor glucose twice daily   [DISCONTINUED] Lancets (ONETOUCH ULTRASOFT) lancets Use as instructed to monitor glucose twice daily   glipiZIDE  (GLUCOTROL  XL) 10 MG  24 hr tablet Take 1 tablet (10 mg total) by mouth daily with breakfast.   glucose blood (ONETOUCH VERIO) test strip Use as instructed to monitor glucose twice daily   Lancets (ONETOUCH ULTRASOFT) lancets Use as instructed to monitor glucose twice daily   [DISCONTINUED] Continuous Glucose Sensor (DEXCOM G7 SENSOR) MISC Inject 1 Application into the skin as directed. Change sensor every 10 days as directed. (Patient not taking: Reported on 05/28/2024)   No facility-administered encounter medications on file as of 05/28/2024.    ALLERGIES: Allergies  Allergen Reactions   Mounjaro  [Tirzepatide ] Nausea And Vomiting    Patient also had gas, lightheadedness.   Bacitracin-Polymyxin B Other (See Comments) and Dermatitis    Cause wound to become infected  Will actually cause infection instead of healing the area it has been applied to.   Codeine Nausea Only and Nausea And Vomiting   Neomycin -Bacitracin Zn-Polymyx Other (See Comments)    Will actually cause infection instead of healing the area it has been applied to.   Penicillins Rash    Starts at feet and works its way up the body.    Metformin  Nausea And Vomiting    With dizziness   Neomycin -Polymyxin-Gramicidin Other (See Comments)    Cause wound to become infected   Roxicet [Oxycodone -Acetaminophen ] Itching   Sulfonamide Derivatives     REACTION: UNKNOWN REACTION   Penicillin G Rash   Sulfamethoxazole Rash    VACCINATION STATUS:  There is no immunization history on file for this patient.  Diabetes She presents for her follow-up diabetic visit. She has type 2 diabetes mellitus. Onset time: diagnosed initially prior to gastric  bypass in 2010, went into remission for a time, now it is back. Her disease course has been worsening. There are no hypoglycemic associated symptoms. There are no hypoglycemic complications. (gastroparesis) Risk factors for coronary artery disease include diabetes mellitus, family history, hypertension and obesity. Current diabetic treatment includes oral agent (monotherapy). She is compliant with treatment most of the time. Her weight is fluctuating minimally. She is following a generally healthy diet. When asked about meal planning, she reported none. She has not had a previous visit with a dietitian. She participates in exercise intermittently. Her home blood glucose trend is increasing steadily. Her breakfast blood glucose range is generally 180-200 mg/dl. Her bedtime blood glucose range is generally >200 mg/dl. (She presents today with her logs showing above target glycemic profile overall.  Her POCT A1c today is 7.9%, increasing from last visit of 6.9%.  She denies any hypoglycemia.  She admits she has eaten supper later than usual at time which can contribute to higher evening readings.  She has taken an extra Glipizide  tablet on days her glucose was higher.) An ACE inhibitor/angiotensin II receptor blocker is being taken. She does not see a podiatrist.Eye exam is current.     Review of systems  Constitutional: + steadily increasing body weight, current Body mass index is 31.25 kg/m., no fatigue, no subjective hyperthermia, no subjective hypothermia Eyes: no blurry vision, no xerophthalmia ENT: no sore throat, no nodules palpated in throat, no dysphagia/odynophagia, no hoarseness Cardiovascular: no chest pain, no shortness of breath, no palpitations, no leg swelling Respiratory: no cough, no shortness of breath Gastrointestinal: no nausea/vomiting/diarrhea Musculoskeletal: no muscle/joint aches Skin: no rashes, no hyperemia Neurological: no tremors, no numbness, no tingling, no  dizziness Psychiatric: no depression, no anxiety  Objective:     BP 120/70 (BP Location: Left Arm, Patient Position: Sitting, Cuff Size: Large)   Pulse  97   Ht 5' 3 (1.6 m)   Wt 176 lb 6.4 oz (80 kg)   LMP 10/18/2015   BMI 31.25 kg/m   Wt Readings from Last 3 Encounters:  05/28/24 176 lb 6.4 oz (80 kg)  01/11/24 170 lb 3.2 oz (77.2 kg)  11/14/23 167 lb 15.9 oz (76.2 kg)     BP Readings from Last 3 Encounters:  05/28/24 120/70  02/21/24 119/81  01/24/24 108/78      Physical Exam- Limited  Constitutional:  Body mass index is 31.25 kg/m. , not in acute distress, normal state of mind Eyes:  EOMI, no exophthalmos Musculoskeletal: no gross deformities, strength intact in all four extremities, no gross restriction of joint movements Skin:  no rashes, no hyperemia Neurological: no tremor with outstretched hands   Diabetic Foot Exam - Simple   No data filed      CMP ( most recent) CMP     Component Value Date/Time   NA 137 12/27/2023 1329   NA 143 12/24/2014 1432   K 3.6 12/27/2023 1329   CL 103 12/27/2023 1329   CO2 25 12/27/2023 1329   GLUCOSE 149 (H) 12/27/2023 1329   BUN 25 (H) 12/27/2023 1329   BUN 24 (A) 06/02/2023 0000   CREATININE 0.73 12/27/2023 1329   CREATININE 0.69 08/15/2012 0835   CALCIUM 10.4 (H) 12/27/2023 1329   PROT 7.4 12/27/2023 1329   PROT 7.1 12/24/2014 1432   ALBUMIN 4.0 12/27/2023 1329   ALBUMIN 4.5 12/24/2014 1432   AST 35 12/27/2023 1329   ALT 69 (H) 12/27/2023 1329   ALKPHOS 104 12/27/2023 1329   BILITOT 0.3 12/27/2023 1329   BILITOT 0.3 12/24/2014 1432   EGFR >90 06/02/2023 0000   GFRNONAA >60 12/27/2023 1329     Diabetic Labs (most recent): Lab Results  Component Value Date   HGBA1C 7.9 (A) 05/28/2024   HGBA1C 6.9 (A) 01/24/2024   HGBA1C 7.5 (A) 09/25/2023   MICROALBUR 90mg /L 05/28/2024   MICROALBUR 80mg /L 06/27/2023     Lipid Panel ( most recent) Lipid Panel     Component Value Date/Time   CHOL 138 08/15/2012  0835   TRIG 110 06/02/2023 0000   HDL 39 (L) 08/15/2012 0835   CHOLHDL 3.5 08/15/2012 0835   VLDL 17 08/15/2012 0835   LDLCALC 93 06/02/2023 0000      Lab Results  Component Value Date   TSH 2.00 06/02/2023   TSH 0.906 12/24/2014   TSH 1.447 08/04/2011           Assessment & Plan:   1) Type 2 diabetes mellitus without complication, without long-term current use of insulin  (HCC)  She presents today with her logs showing above target glycemic profile overall.  Her POCT A1c today is 7.9%, increasing from last visit of 6.9%.  She denies any hypoglycemia.  She admits she has eaten supper later than usual at time which can contribute to higher evening readings.  She has taken an extra Glipizide  tablet on days her glucose was higher.  - Melody Johnson has currently uncontrolled symptomatic type 2 DM.   -Recent labs reviewed.  Her POCT UM today shows mild microalbuminuria.  She has labs coming up next month with her PCP, will request copy to assess kidney function.  - I had a long discussion with her about the progressive nature of diabetes and the pathology behind its complications. -her diabetes is complicated by gastroparesis and she remains at a high risk for more acute and chronic  complications which include CAD, CVA, CKD, retinopathy, and neuropathy. These are all discussed in detail with her.  The following Lifestyle Medicine recommendations according to American College of Lifestyle Medicine [facility]) were discussed and offered to patient and she agrees to start the journey:  A. Whole Foods, Plant-based plate comprising of fruits and vegetables, plant-based proteins, whole-grain carbohydrates was discussed in detail with the patient.   A list for source of those nutrients were also provided to the patient.  Patient will use only water or unsweetened tea for hydration. B.  The need to stay away from risky substances including alcohol , smoking; obtaining 7 to 9 hours of restorative  sleep, at least 150 minutes of moderate intensity exercise weekly, the importance of healthy social connections,  and stress reduction techniques were discussed. C.  A full color page of  Calorie density of various food groups per pound showing examples of each food groups was provided to the patient.  - Nutritional counseling repeated/built upon at each appointment.  - The patient admits there is a room for improvement in their diet and drink choices. -  Suggestion is made for the patient to avoid simple carbohydrates from their diet including Cakes, Sweet Desserts / Pastries, Ice Cream, Soda (diet and regular), Sweet Tea, Candies, Chips, Cookies, Sweet Pastries, Store Bought Juices, Alcohol  in Excess of 1-2 drinks a day, Artificial Sweeteners, Coffee Creamer, and Sugar-free Products. This will help patient to have stable blood glucose profile and potentially avoid unintended weight gain.   - I encouraged the patient to switch to unprocessed or minimally processed complex starch and increased protein intake (animal or plant source), fruits, and vegetables.   - Patient is advised to stick to a routine mealtimes to eat 3 meals a day and avoid unnecessary snacks (to snack only to correct hypoglycemia).  - I have approached her with the following individualized plan to manage her diabetes and patient agrees:   - she is advised to continue Glipizide  10 mg XL daily at breakfast.  Will trial her on Januvia  50 mg po daily.  -she is encouraged to continue monitoring glucose 2 times daily, before breakfast and before bed, and to call the clinic if she has readings less than 70 or above 300 for 3 tests in a row.  - Adjustment parameters are given to her for hypo and hyperglycemia in writing.  - she is not a candidate for Metformin  due to GI side effects.  -She has been prescribed both Trulicity (did better on this one vs Ozempic) and Ozempic in the past but could not afford the copay.  She did try the  Mounjaro  most recently but could not tolerate it due to GI SE as well.  - Specific targets for  A1c; LDL, HDL, and Triglycerides were discussed with the patient.  2) Blood Pressure /Hypertension:  her blood pressure is controlled to target.   she is advised to continue her current medications including Losartan 25 mg p.o. daily with breakfast.  3) Lipids/Hyperlipidemia:    Review of her recent lipid panel from 06/02/23 showed controlled LDL at 93 .  she is advised to continue Lipitor 20 mg daily at bedtime.  Side effects and precautions discussed with her.    4)  Weight/Diet:  her Body mass index is 31.25 kg/m.  -  clearly complicating her diabetes care.   she is a candidate for weight loss. She did have gastric bypass surgery in the past but could stand to lose a bit more  weight.  I discussed with her the fact that loss of 5 - 10% of her  current body weight will have the most impact on her diabetes management.  Exercise, and detailed carbohydrates information provided  -  detailed on discharge instructions.  5) Chronic Care/Health Maintenance: -she is on ACEI/ARB and Statin medications and is encouraged to initiate and continue to follow up with Ophthalmology, Dentist, Podiatrist at least yearly or according to recommendations, and advised to stay away from smoking. I have recommended yearly flu vaccine and pneumonia vaccine at least every 5 years; moderate intensity exercise for up to 150 minutes weekly; and sleep for at least 7 hours a day.  - she is advised to maintain close follow up with James, Natalie W, PA-C for primary care needs, as well as her other providers for optimal and coordinated care.     I spent  35  minutes in the care of the patient today including review of labs from CMP, Lipids, Thyroid Function, Hematology (current and previous including abstractions from other facilities); face-to-face time discussing  her blood glucose readings/logs, discussing hypoglycemia and  hyperglycemia episodes and symptoms, medications doses, her options of short and long term treatment based on the latest standards of care / guidelines;  discussion about incorporating lifestyle medicine;  and documenting the encounter. Risk reduction counseling performed per USPSTF guidelines to reduce obesity and cardiovascular risk factors.     Please refer to Patient Instructions for Blood Glucose Monitoring and Insulin /Medications Dosing Guide  in media tab for additional information. Please  also refer to  Patient Self Inventory in the Media  tab for reviewed elements of pertinent patient history.  Melody Johnson participated in the discussions, expressed understanding, and voiced agreement with the above plans.  All questions were answered to her satisfaction. she is encouraged to contact clinic should she have any questions or concerns prior to her return visit.     Follow up plan: - Return in about 4 months (around 09/26/2024) for Diabetes F/U with A1c in office, No previsit labs, Bring meter and logs.   Benton Rio, [facility] [address] [place], [place] [address] Phone: [phone] Fax: [phone]  05/28/2024, 8:50 AM

## 2024-06-07 ENCOUNTER — Encounter: Payer: Self-pay | Admitting: Nurse Practitioner

## 2024-06-10 MED ORDER — ACCU-CHEK GUIDE ME W/DEVICE KIT: PACK | 0 refills | Status: AC

## 2024-06-10 MED ORDER — ACCU-CHEK GUIDE TEST VI STRP: ORAL_STRIP | 12 refills | Status: AC

## 2024-06-10 MED ORDER — ACCU-CHEK SOFTCLIX LANCETS MISC: 12 refills | Status: AC

## 2024-06-12 ENCOUNTER — Encounter: Payer: Self-pay | Admitting: Nurse Practitioner

## 2024-06-18 ENCOUNTER — Other Ambulatory Visit: Payer: Self-pay | Admitting: Adult Health

## 2024-07-08 ENCOUNTER — Encounter: Payer: Self-pay | Admitting: Oncology

## 2024-07-08 ENCOUNTER — Inpatient Hospital Stay: Attending: Physician Assistant

## 2024-07-08 DIAGNOSIS — E611 Iron deficiency: Secondary | ICD-10-CM | POA: Diagnosis not present

## 2024-07-08 DIAGNOSIS — E538 Deficiency of other specified B group vitamins: Secondary | ICD-10-CM | POA: Diagnosis present

## 2024-07-08 DIAGNOSIS — K912 Postsurgical malabsorption, not elsewhere classified: Secondary | ICD-10-CM | POA: Diagnosis not present

## 2024-07-08 DIAGNOSIS — Z79899 Other long term (current) drug therapy: Secondary | ICD-10-CM | POA: Insufficient documentation

## 2024-07-08 DIAGNOSIS — Z9884 Bariatric surgery status: Secondary | ICD-10-CM | POA: Insufficient documentation

## 2024-07-08 DIAGNOSIS — Z806 Family history of leukemia: Secondary | ICD-10-CM | POA: Diagnosis not present

## 2024-07-08 DIAGNOSIS — D509 Iron deficiency anemia, unspecified: Secondary | ICD-10-CM

## 2024-07-08 LAB — FOLATE: Folate: 18 ng/mL

## 2024-07-08 LAB — VITAMIN B12: Vitamin B-12: 1023 pg/mL — ABNORMAL HIGH (ref 180–914)

## 2024-07-11 NOTE — Progress Notes (Signed)
 "  [facility] [address], [place] [address]   CLINIC:  Medical Oncology/Hematology  PCP:  Lynwood Laneta ORN, PA-C [address] [place] [address] [phone]   REASON FOR VISIT:  Follow-up for iron deficiency (without anemia) + vitamin B12 deficiency  PRIOR THERAPY: Oral iron (unable to tolerate due to nausea and vomiting)  CURRENT THERAPY: Oral B12 + intermittent IV iron  INTERVAL HISTORY:   Melody Johnson 59 y.o. female returns for routine follow-up of iron deficiency and B12 deficiency. She was last seen by Dr. Davonna on 01/11/2024. She received IV Feraheme x 2 doses in May/June 2025  In the interim since last visit, she has not had any surgeries, hospitalizations, or changes in baseline health status.   At today's visit, she reports feeling fair.  She  reports 50% energy and 80% appetite.   She  is maintaining stable weight at this time.  She continues to take vitamin B12 1000 mcg daily. She is unable to tolerate oral iron. She had some improvement in energy after IV iron infusions in May/June 2025, but does have some mild recurrent fatigue.  She is able to complete her ADLs and IADLs, but reports that she needs to rest afterwards to recover her strength. She continues to experience restless legs at night and intermittent headaches. She reports positional vertigo. She denies any ice pica, lightheadedness, syncope, chest pain, dyspnea on exertion. She denies any evidence of rectal bleeding or melena.  ASSESSMENT & PLAN:  1.  Iron deficiency # Vitamin B12 deficiency # Malabsorption s/p bariatric surgery - Most likely cause of iron and B12 deficiency is malabsorption syndrome (s/p bariatric surgery in 2013) - She had mild B12 deficiency with levels < 400 - Labs from PCP (10/25/2023) with significant iron deficiency (ferritin 5, iron saturation 9%, elevated TIBC 578) - Last colonoscopy/endoscopy (September 2024) with no evidence of active  bleeding - Significant improvement in iron levels and symptoms following IV iron (Feraheme x 2 in May/June 2025) - Unable to tolerate oral iron due to nausea and vomiting, despite trying several formulations.  She does take iron containing multivitamin. - She is taking vitamin B12 1000 mcg daily - Denies any rectal bleeding or melena.   - Most recent labs (via PCP and on 07/04/2024 and via [facility] lab on 07/08/2024): Hgb 14.1/MCV 97.8 Creatinine 0.68/GFR >90 Ferritin 70, iron saturation 42% Vitamin B12 mildly elevated 1023, normal folate 18.0 - PLAN: Patient would like to hold off on IV iron.  She does have some fatigue with ferritin <100, but would like to see how her labs trend at next visit. - EGD/colonoscopy from 2025 did not show any bleeding, but if she has any significant drops in iron or hemoglobin, consider stool cards and additional GI workup. - DECREASE vitamin B12 to take 1000 mcg every OTHER day - RTC in 6 months for labs and phone visit   PLAN SUMMARY: >> Labs in 6 months = CBC/D, ferritin, iron/TIBC, B12, MMA >> PHONE visit in 6 months (1 week after labs)     REVIEW OF SYSTEMS:   Review of Systems  Constitutional:  Positive for fatigue. Negative for appetite change, chills, diaphoresis, fever and unexpected weight change.  HENT:   Negative for lump/mass and nosebleeds.   Eyes:  Negative for eye problems.  Respiratory:  Negative for cough, hemoptysis and shortness of breath.   Cardiovascular:  Negative for chest pain, leg swelling and palpitations.  Gastrointestinal:  Positive for nausea.  Negative for abdominal pain, blood in stool, constipation, diarrhea and vomiting.  Genitourinary:  Negative for hematuria.   Skin: Negative.   Neurological:  Positive for dizziness and headaches. Negative for light-headedness.  Hematological:  Does not bruise/bleed easily.     PHYSICAL EXAM:  ECOG PERFORMANCE STATUS: 1 - Symptomatic but completely ambulatory  Vitals:   07/15/24 1402   BP: 134/82  Pulse: 84  Resp: 17  Temp: 98.9 F (37.2 C)  SpO2: 97%   Filed Weights   07/15/24 1402  Weight: 180 lb (81.6 kg)   Physical Exam Constitutional:      Appearance: Normal appearance. She is obese.  Cardiovascular:     Heart sounds: Normal heart sounds.  Pulmonary:     Breath sounds: Normal breath sounds.  Neurological:     General: No focal deficit present.     Mental Status: Mental status is at baseline.  Psychiatric:        Behavior: Behavior normal. Behavior is cooperative.    PAST MEDICAL/SURGICAL HISTORY:  Past Medical History:  Diagnosis Date   Anxiety    Arthritis    Depression    Diabetes mellitus without complication (HCC)    off meds after gastric bypass   GERD (gastroesophageal reflux disease)    High cholesterol    Hot flashes 12/24/2014   Hypertension    off meds after gastric bypass   Night sweats 12/24/2014   Obstructive sleep apnea    not using CPAP due to gastric bypass- not used for iver 2 yrs   PMB (postmenopausal bleeding) 12/25/2015   PONV (postoperative nausea and vomiting)    Postmenopausal HRT (hormone replacement therapy) 12/25/2015   Past Surgical History:  Procedure Laterality Date   BREAST BIOPSY Right 06/02/2015   FIBROCYSTIC CHANGE WITH APOCRINE METAPLASIA   CATARACT EXTRACTION W/PHACO Left 07/24/2014   Procedure: CATARACT EXTRACTION PHACO AND INTRAOCULAR LENS PLACEMENT LEFT;  Surgeon: Cherene Mania, MD;  Location: [facility];  Service: Ophthalmology;  Laterality: Left;  CDE:1.82   CATARACT EXTRACTION W/PHACO Right 08/04/2014   Procedure: CATARACT EXTRACTION PHACO AND INTRAOCULAR LENS PLACEMENT RIGHT EYE CDE=1.23;  Surgeon: Cherene Mania, MD;  Location: [facility];  Service: Ophthalmology;  Laterality: Right;   CERVICAL SPINE SURGERY  06/27/1998   CHOLECYSTECTOMY  06/27/2000   GASTRIC ROUX-EN-Y  05/01/2012   Procedure: LAPAROSCOPIC ROUX-EN-Y GASTRIC BYPASS WITH UPPER ENDOSCOPY;  Surgeon: Redell Faith, DO;  Location: [facility];  Service:  General;  Laterality: N/A;   LASIK  06/27/1994   LITHOTRIPSY  05/28/2011    SOCIAL HISTORY:  Social History   Socioeconomic History   Marital status: Married    Spouse name: Not on file   Number of children: Not on file   Years of education: Not on file   Highest education level: Not on file  Occupational History   Not on file  Tobacco Use   Smoking status: Never   Smokeless tobacco: Never  Vaping Use   Vaping status: Never Used  Substance and Sexual Activity   Alcohol  use: Yes    Comment: occ   Drug use: No   Sexual activity: Yes    Birth control/protection: Post-menopausal  Other Topics Concern   Not on file  Social History Narrative   Not on file   Social Drivers of Health   Tobacco Use: Low Risk (07/04/2024)   Received from [facility]   Patient History    Smoking Tobacco Use: Never    Smokeless Tobacco Use: Never  Passive Exposure: Not on file  Financial Resource Strain: Low Risk (09/20/2023)   Overall Financial Resource Strain (CARDIA)    Difficulty of Paying Living Expenses: Not very hard  Food Insecurity: Low Risk (07/04/2024)   Received from [facility]   Epic    Within the past 12 months, you worried that your food would run out before you got money to buy more: Never true    Within the past 12 months, the food you bought just didn't last and you didn't have money to get more. : Never true  Transportation Needs: No Transportation Needs (07/04/2024)   Received from [facility]    In the past 12 months, has lack of reliable transportation kept you from medical appointments, meetings, work or from getting things needed for daily living? : No  Physical Activity: Insufficiently Active (09/20/2023)   Exercise Vital Sign    Days of Exercise per Week: 2 days    Minutes of Exercise per Session: 40 min  Stress: No Stress Concern Present (09/20/2023)   [employer] of Occupational Health - Occupational Stress Questionnaire    Feeling of  Stress : Only a little  Social Connections: Moderately Integrated (09/20/2023)   Social Connection and Isolation Panel    Frequency of Communication with Friends and Family: More than three times a week    Frequency of Social Gatherings with Friends and Family: Once a week    Attends Religious Services: 1 to 4 times per year    Active Member of [employer] or Organizations: No    Attends Banker Meetings: Never    Marital Status: Married  Catering Manager Violence: Not At Risk (09/20/2023)   Humiliation, Afraid, Rape, and Kick questionnaire    Fear of Current or Ex-Partner: No    Emotionally Abused: No    Physically Abused: No    Sexually Abused: No  Depression (PHQ2-9): Low Risk (07/15/2024)   Depression (PHQ2-9)    PHQ-2 Score: 0  Alcohol  Screen: Low Risk (09/20/2023)   Alcohol  Screen    Last Alcohol  Screening Score (AUDIT): 0  Housing: Low Risk (07/04/2024)   Received from [facility]   Epic    What is your living situation today?: I have a steady place to live    Think about the place you live. Do you have problems with any of the following? Choose all that apply:: None/None on this list  Utilities: Low Risk (07/04/2024)   Received from [facility]   Utilities    In the past 12 months has the electric, gas, oil, or water company threatened to shut off services in your home? : No  Health Literacy: Adequate Health Literacy (09/20/2023)   B1300 Health Literacy    Frequency of need for help with medical instructions: Never    FAMILY HISTORY:  Family History  Problem Relation Age of Onset   Cancer Sister        cervical   Cancer Maternal Grandmother        colon   Crohn's disease Mother    Leukemia Mother    Hyperlipidemia Mother    Thyroid disease Mother    Stroke Mother    Diabetes Father    Hypertension Father    Hyperlipidemia Father    Diabetes Sister     CURRENT MEDICATIONS:  Outpatient Encounter Medications as of 07/15/2024  Medication Sig Note    bimatoprost (LATISSE) 0.03 % ophthalmic solution Apply 1 drop to eye at bedtime.  hydrochlorothiazide  (HYDRODIURIL ) 25 MG tablet Take 12.5 mg by mouth daily.    mirtazapine (REMERON) 30 MG tablet TAKE 1 TABLET(30 MG) BY MOUTH AT BEDTIME    Accu-Chek Softclix Lancets lancets Use as instructed to monitor glucose twice daily    ALPRAZolam (XANAX) 0.5 MG tablet Take 0.5 mg by mouth 3 (three) times daily as needed for anxiety. 09/25/2023: Patient states that she takes 2 by mouth at night   atorvastatin (LIPITOR) 20 MG tablet Take 20 mg by mouth daily.    Blood Glucose Monitoring Suppl (ACCU-CHEK GUIDE ME) w/Device KIT Use to monitor glucose twice daily    conjugated estrogens  (PREMARIN ) vaginal cream Use 1 gm in vagina at bedtime for 2 weeks then 2-3 x weekly    cyanocobalamin  (VITAMIN B12) 1000 MCG tablet Take 1 tablet (1,000 mcg total) by mouth daily.    estradiol  (ESTRACE ) 2 MG tablet TAKE 1 TABLET(2 MG) BY MOUTH DAILY    fluvoxaMINE (LUVOX) 50 MG tablet TAKE 1 TABLET BY MOUTH IN THE MORNING AND 2 TABLETS BY MOUTH AT BEDTIME Oral for 30 Days (Patient taking differently: 1 tablet in the morning and 1 tablet at night)    glipiZIDE  (GLUCOTROL  XL) 10 MG 24 hr tablet Take 1 tablet (10 mg total) by mouth daily with breakfast.    glucose blood (ACCU-CHEK GUIDE TEST) test strip Use as instructed to monitor glucose twice daily    Lancets (ONETOUCH ULTRASOFT) lancets Use as instructed to monitor glucose twice daily    losartan (COZAAR) 25 MG tablet Take 25 mg by mouth daily.    montelukast  (SINGULAIR ) 10 MG tablet Oral for 90 Days    Multiple Vitamin (MULTIVITAMIN) tablet Take by mouth daily. Freedavite Multi Vit    omeprazole (PRILOSEC) 40 MG capsule TAKE 1 CAPSULE BY MOUTH DAILY 30 MINUTES BEFORE BREAKFAST ON AN EMPTY STOMACH Oral for 90 Days    pilocarpine (SALAGEN) 5 MG tablet Take 5 mg by mouth 3 (three) times daily.    Probiotic Product (ALIGN PO) Take by mouth.    progesterone  (PROMETRIUM ) 200 MG  capsule Take 1 capsule (200 mg total) by mouth daily.    RESTASIS  MULTIDOSE 0.05 % ophthalmic emulsion  02/02/2016: Received from: External Pharmacy   sitaGLIPtin  (JANUVIA ) 50 MG tablet Take 1 tablet (50 mg total) by mouth daily.    valACYclovir (VALTREX) 500 MG tablet Take 500 mg by mouth daily.    [DISCONTINUED] mirtazapine (REMERON) 15 MG tablet Oral for 90 Days    No facility-administered encounter medications on file as of 07/15/2024.    ALLERGIES:  Allergies[1]  LABORATORY DATA:  I have reviewed the labs as listed.  CBC    Component Value Date/Time   WBC 12.0 (H) 12/27/2023 1329   RBC 4.58 12/27/2023 1329   HGB 15.3 (H) 12/27/2023 1329   HGB 13.6 12/24/2014 1432   HCT 44.4 12/27/2023 1329   HCT 39.9 12/24/2014 1432   PLT 397 12/27/2023 1329   PLT 465 (H) 12/24/2014 1432   MCV 96.9 12/27/2023 1329   MCV 96 12/24/2014 1432   MCH 33.4 12/27/2023 1329   MCHC 34.5 12/27/2023 1329   RDW 14.6 12/27/2023 1329   RDW 13.7 12/24/2014 1432   LYMPHSABS 3.0 12/27/2023 1329   MONOABS 1.0 12/27/2023 1329   EOSABS 0.2 12/27/2023 1329   BASOSABS 0.1 12/27/2023 1329      Latest Ref Rng & Units 12/27/2023    1:29 PM 06/02/2023   12:00 AM 11/29/2022   12:00 AM  CMP  Glucose 70 - 99 mg/dL 850     BUN 6 - 20 mg/dL 25  24     20       Creatinine 0.44 - 1.00 mg/dL 9.26  0.8     0.8      Sodium 135 - 145 mmol/L 137     Potassium 3.5 - 5.1 mmol/L 3.6     Chloride 98 - 111 mmol/L 103     CO2 22 - 32 mmol/L 25     Calcium 8.9 - 10.3 mg/dL 89.5  89.7       Total Protein 6.5 - 8.1 g/dL 7.4     Total Bilirubin 0.0 - 1.2 mg/dL 0.3     Alkaline Phos 38 - 126 U/L 104     AST 15 - 41 U/L 35     ALT 0 - 44 U/L 69        This result is from an external source.    DIAGNOSTIC IMAGING:  I have independently reviewed the relevant imaging and discussed with the patient.   WRAP UP:  All questions were answered. The patient knows to call the clinic with any problems, questions or concerns.  Medical  decision making: Moderate  Time spent on visit: I spent 20 minutes counseling the patient face to face. The total time spent in the appointment was 30 minutes and more than 50% was on counseling.  Pleasant Melody Barefoot, PA-C  07/15/24 2:35 PM       [1]  Allergies Allergen Reactions   Mounjaro  [Tirzepatide ] Nausea And Vomiting    Patient also had gas, lightheadedness.   Bacitracin-Polymyxin B Other (See Comments) and Dermatitis    Cause wound to become infected  Will actually cause infection instead of healing the area it has been applied to.   Codeine Nausea Only and Nausea And Vomiting   Neomycin -Bacitracin Zn-Polymyx Other (See Comments)    Will actually cause infection instead of healing the area it has been applied to.   Penicillins Rash    Starts at feet and works its way up the body.    Metformin  Nausea And Vomiting    With dizziness   Neomycin -Polymyxin-Gramicidin Other (See Comments)    Cause wound to become infected   Roxicet [Oxycodone -Acetaminophen ] Itching   Sulfonamide Derivatives     REACTION: UNKNOWN REACTION   Penicillin G Rash   Sulfamethoxazole Rash   "

## 2024-07-15 ENCOUNTER — Inpatient Hospital Stay: Admitting: Physician Assistant

## 2024-07-15 ENCOUNTER — Encounter: Payer: Self-pay | Admitting: Nurse Practitioner

## 2024-07-15 VITALS — BP 134/82 | HR 84 | Temp 98.9°F | Resp 17 | Wt 180.0 lb

## 2024-07-15 DIAGNOSIS — E611 Iron deficiency: Secondary | ICD-10-CM | POA: Diagnosis not present

## 2024-07-15 DIAGNOSIS — E538 Deficiency of other specified B group vitamins: Secondary | ICD-10-CM

## 2024-07-15 MED ORDER — VITAMIN B-12 1000 MCG PO TABS: 1000.0000 ug | ORAL_TABLET | ORAL | 1 refills | Status: AC

## 2024-07-15 NOTE — Patient Instructions (Signed)
 [facility] **VISIT SUMMARY & IMPORTANT INSTRUCTIONS **   You were seen today by Pleasant Barefoot PA-C for your iron deficiency and vitamin B12 deficiency. Your iron and B12 deficiencies are most likely related to malabsorption from your prior gastric bypass surgery. Continue to take multivitamin daily. Please DECREASE your vitamin B12 so that you take it every other day (instead of daily). Although your iron is slightly lower than we would like it to be (goal is to have ferritin >100), you do not need any IV iron unless you become symptomatic (severe fatigue, cravings for ice, increased headaches, lethargy). We will recheck labs and see you for phone visit in 6 months. However, if you have any worsening symptoms in the meantime, please do not hesitate to reach out.  FOLLOW-UP APPOINTMENT: 6 months  ** Thank you for trusting me with your healthcare!  I strive to provide all of my patients with quality care at each visit.  If you receive a survey for this visit, I would be so grateful to you for taking the time to provide feedback.  Thank you in advance!  ~ Lisha Vitale                                        Dr. Mickiel Davonna Pleasant Barefoot, PA-C          Delon Hope, NP   - - - - - - - - - - - - - - - - - -    Thank you for choosing [facility] at [facility] to provide your oncology and hematology care.  To afford each patient quality time with our provider, please arrive at least 15 minutes before your scheduled appointment time.   If you have a lab appointment with the Cancer Center please come in thru the Main Entrance and check in at the main information desk.  You need to re-schedule your appointment should you arrive 10 or more minutes late.  We strive to give you quality time with our providers, and arriving late affects you and other patients whose appointments are after yours.  Also, if you no show three or more  times for appointments you may be dismissed from the clinic at the providers discretion.     Again, thank you for choosing [facility].  Our hope is that these requests will decrease the amount of time that you wait before being seen by our physicians.       _____________________________________________________________  Should you have questions after your visit to [facility], please contact our office at [phone] and follow the prompts.  Our office hours are 8:00 a.m. and 4:30 p.m. Monday - Friday.  Please note that voicemails left after 4:00 p.m. may not be returned until the following business day.  We are closed weekends and major holidays.  You do have access to a nurse 24-7, just call the main number to the clinic [phone] and do not press any options, hold on the line and a nurse will answer the phone.    For prescription refill requests, have your pharmacy contact our office and allow 72 hours.

## 2024-07-16 ENCOUNTER — Other Ambulatory Visit (HOSPITAL_COMMUNITY): Payer: Self-pay | Admitting: Family Medicine

## 2024-07-16 DIAGNOSIS — Z1231 Encounter for screening mammogram for malignant neoplasm of breast: Secondary | ICD-10-CM

## 2024-08-28 ENCOUNTER — Ambulatory Visit (HOSPITAL_COMMUNITY)

## 2024-09-26 ENCOUNTER — Ambulatory Visit: Admitting: Nurse Practitioner

## 2025-01-13 ENCOUNTER — Inpatient Hospital Stay

## 2025-01-20 ENCOUNTER — Inpatient Hospital Stay: Admitting: Physician Assistant
